# Patient Record
Sex: Female | Born: 1950 | Race: Black or African American | Hispanic: No | Marital: Single | Smoking: Never smoker
Health system: Southern US, Community
[De-identification: ages and names within clinical notes are randomized; demographics above are authoritative.]

## PROBLEM LIST (undated history)

## (undated) DIAGNOSIS — E785 Hyperlipidemia, unspecified: Secondary | ICD-10-CM

## (undated) DIAGNOSIS — R2681 Unsteadiness on feet: Secondary | ICD-10-CM

## (undated) DIAGNOSIS — F0280 Dementia in other diseases classified elsewhere without behavioral disturbance: Secondary | ICD-10-CM

## (undated) DIAGNOSIS — G8929 Other chronic pain: Secondary | ICD-10-CM

## (undated) DIAGNOSIS — S52609A Unspecified fracture of lower end of unspecified ulna, initial encounter for closed fracture: Secondary | ICD-10-CM

## (undated) DIAGNOSIS — G309 Alzheimer's disease, unspecified: Secondary | ICD-10-CM

## (undated) DIAGNOSIS — S5290XA Unspecified fracture of unspecified forearm, initial encounter for closed fracture: Secondary | ICD-10-CM

## (undated) DIAGNOSIS — E119 Type 2 diabetes mellitus without complications: Secondary | ICD-10-CM

## (undated) DIAGNOSIS — F419 Anxiety disorder, unspecified: Secondary | ICD-10-CM

## (undated) DIAGNOSIS — M1711 Unilateral primary osteoarthritis, right knee: Secondary | ICD-10-CM

## (undated) DIAGNOSIS — F0390 Unspecified dementia without behavioral disturbance: Secondary | ICD-10-CM

## (undated) DIAGNOSIS — F339 Major depressive disorder, recurrent, unspecified: Secondary | ICD-10-CM

## (undated) DIAGNOSIS — K5909 Other constipation: Secondary | ICD-10-CM

## (undated) DIAGNOSIS — R634 Abnormal weight loss: Secondary | ICD-10-CM

## (undated) DIAGNOSIS — R451 Restlessness and agitation: Secondary | ICD-10-CM

## (undated) DIAGNOSIS — I10 Essential (primary) hypertension: Secondary | ICD-10-CM

## (undated) DIAGNOSIS — K649 Unspecified hemorrhoids: Secondary | ICD-10-CM

## (undated) DIAGNOSIS — R600 Localized edema: Secondary | ICD-10-CM

## (undated) DIAGNOSIS — F0391 Unspecified dementia with behavioral disturbance: Secondary | ICD-10-CM

## (undated) HISTORY — DX: Type 2 diabetes mellitus without complications: E11.9

## (undated) HISTORY — DX: Unilateral primary osteoarthritis, right knee: M17.11

## (undated) HISTORY — PX: ROTATOR CUFF REPAIR: SHX139

## (undated) HISTORY — DX: Anxiety disorder, unspecified: F41.9

## (undated) HISTORY — DX: Restlessness and agitation: R45.1

## (undated) HISTORY — DX: Major depressive disorder, recurrent, unspecified: F33.9

## (undated) HISTORY — DX: Unspecified fracture of lower end of unspecified ulna, initial encounter for closed fracture: S52.609A

## (undated) HISTORY — DX: Unspecified dementia, unspecified severity, with behavioral disturbance: F03.91

## (undated) HISTORY — PX: TOTAL ABDOMINAL HYSTERECTOMY: SHX209

## (undated) HISTORY — DX: Alzheimer's disease, unspecified: G30.9

## (undated) HISTORY — DX: Unspecified hemorrhoids: K64.9

## (undated) HISTORY — DX: Localized edema: R60.0

## (undated) HISTORY — DX: Essential (primary) hypertension: I10

## (undated) HISTORY — DX: Hyperlipidemia, unspecified: E78.5

## (undated) HISTORY — DX: Unspecified dementia, unspecified severity, without behavioral disturbance, psychotic disturbance, mood disturbance, and anxiety: F03.90

## (undated) HISTORY — DX: Other constipation: K59.09

## (undated) HISTORY — DX: Unsteadiness on feet: R26.81

## (undated) HISTORY — DX: Other chronic pain: G89.29

## (undated) HISTORY — DX: Abnormal weight loss: R63.4

## (undated) HISTORY — DX: Dementia in other diseases classified elsewhere, unspecified severity, without behavioral disturbance, psychotic disturbance, mood disturbance, and anxiety: F02.80

## (undated) HISTORY — DX: Unspecified fracture of unspecified forearm, initial encounter for closed fracture: S52.90XA

---

## 1997-11-06 ENCOUNTER — Encounter: Admission: RE | Admit: 1997-11-06 | Discharge: 1997-11-06 | Payer: Self-pay | Admitting: Family Medicine

## 1998-04-04 ENCOUNTER — Encounter: Admission: RE | Admit: 1998-04-04 | Discharge: 1998-04-04 | Payer: Self-pay | Admitting: Family Medicine

## 1998-06-28 ENCOUNTER — Encounter: Admission: RE | Admit: 1998-06-28 | Discharge: 1998-06-28 | Payer: Self-pay | Admitting: Family Medicine

## 1998-07-11 ENCOUNTER — Encounter: Admission: RE | Admit: 1998-07-11 | Discharge: 1998-07-11 | Payer: Self-pay | Admitting: Family Medicine

## 1998-09-08 ENCOUNTER — Encounter (INDEPENDENT_AMBULATORY_CARE_PROVIDER_SITE_OTHER): Payer: Self-pay | Admitting: *Deleted

## 1998-09-08 LAB — CONVERTED CEMR LAB

## 1998-10-05 ENCOUNTER — Encounter: Admission: RE | Admit: 1998-10-05 | Discharge: 1998-10-05 | Payer: Self-pay | Admitting: Family Medicine

## 1998-10-21 ENCOUNTER — Emergency Department (HOSPITAL_COMMUNITY): Admission: EM | Admit: 1998-10-21 | Discharge: 1998-10-21 | Payer: Self-pay | Admitting: Emergency Medicine

## 1998-11-06 ENCOUNTER — Encounter: Admission: RE | Admit: 1998-11-06 | Discharge: 1998-11-06 | Payer: Self-pay | Admitting: Family Medicine

## 1998-11-09 ENCOUNTER — Encounter: Admission: RE | Admit: 1998-11-09 | Discharge: 1998-11-09 | Payer: Self-pay | Admitting: Family Medicine

## 1999-01-28 ENCOUNTER — Encounter: Admission: RE | Admit: 1999-01-28 | Discharge: 1999-01-28 | Payer: Self-pay | Admitting: Family Medicine

## 1999-01-28 ENCOUNTER — Ambulatory Visit (HOSPITAL_COMMUNITY): Admission: RE | Admit: 1999-01-28 | Discharge: 1999-01-28 | Payer: Self-pay | Admitting: Family Medicine

## 1999-05-08 ENCOUNTER — Encounter: Admission: RE | Admit: 1999-05-08 | Discharge: 1999-05-08 | Payer: Self-pay | Admitting: Family Medicine

## 1999-08-09 ENCOUNTER — Encounter: Admission: RE | Admit: 1999-08-09 | Discharge: 1999-08-09 | Payer: Self-pay | Admitting: Family Medicine

## 1999-10-25 ENCOUNTER — Encounter: Admission: RE | Admit: 1999-10-25 | Discharge: 1999-10-25 | Payer: Self-pay | Admitting: Family Medicine

## 1999-11-05 ENCOUNTER — Encounter: Admission: RE | Admit: 1999-11-05 | Discharge: 1999-11-05 | Payer: Self-pay | Admitting: *Deleted

## 1999-11-05 ENCOUNTER — Encounter: Payer: Self-pay | Admitting: *Deleted

## 1999-12-01 ENCOUNTER — Encounter: Payer: Self-pay | Admitting: Emergency Medicine

## 1999-12-01 ENCOUNTER — Emergency Department (HOSPITAL_COMMUNITY): Admission: EM | Admit: 1999-12-01 | Discharge: 1999-12-01 | Payer: Self-pay | Admitting: Emergency Medicine

## 1999-12-03 ENCOUNTER — Encounter: Admission: RE | Admit: 1999-12-03 | Discharge: 1999-12-03 | Payer: Self-pay | Admitting: Family Medicine

## 1999-12-19 ENCOUNTER — Encounter: Admission: RE | Admit: 1999-12-19 | Discharge: 1999-12-19 | Payer: Self-pay | Admitting: Family Medicine

## 2000-02-19 ENCOUNTER — Encounter: Admission: RE | Admit: 2000-02-19 | Discharge: 2000-02-19 | Payer: Self-pay | Admitting: Family Medicine

## 2000-03-12 ENCOUNTER — Encounter: Admission: RE | Admit: 2000-03-12 | Discharge: 2000-03-12 | Payer: Self-pay | Admitting: Family Medicine

## 2000-04-16 ENCOUNTER — Encounter: Admission: RE | Admit: 2000-04-16 | Discharge: 2000-04-16 | Payer: Self-pay | Admitting: Family Medicine

## 2000-07-06 ENCOUNTER — Encounter: Admission: RE | Admit: 2000-07-06 | Discharge: 2000-07-06 | Payer: Self-pay | Admitting: Family Medicine

## 2000-09-22 ENCOUNTER — Encounter: Admission: RE | Admit: 2000-09-22 | Discharge: 2000-09-22 | Payer: Self-pay | Admitting: Family Medicine

## 2000-09-30 ENCOUNTER — Encounter: Admission: RE | Admit: 2000-09-30 | Discharge: 2000-09-30 | Payer: Self-pay | Admitting: Family Medicine

## 2000-10-29 ENCOUNTER — Encounter: Admission: RE | Admit: 2000-10-29 | Discharge: 2000-10-29 | Payer: Self-pay | Admitting: Family Medicine

## 2000-12-17 ENCOUNTER — Encounter: Admission: RE | Admit: 2000-12-17 | Discharge: 2000-12-17 | Payer: Self-pay | Admitting: Family Medicine

## 2000-12-31 ENCOUNTER — Encounter: Admission: RE | Admit: 2000-12-31 | Discharge: 2000-12-31 | Payer: Self-pay | Admitting: Family Medicine

## 2001-02-02 ENCOUNTER — Encounter: Admission: RE | Admit: 2001-02-02 | Discharge: 2001-02-02 | Payer: Self-pay | Admitting: Family Medicine

## 2001-02-24 ENCOUNTER — Encounter: Admission: RE | Admit: 2001-02-24 | Discharge: 2001-02-24 | Payer: Self-pay | Admitting: Family Medicine

## 2001-06-17 ENCOUNTER — Encounter: Admission: RE | Admit: 2001-06-17 | Discharge: 2001-06-17 | Payer: Self-pay | Admitting: Family Medicine

## 2001-07-19 ENCOUNTER — Encounter: Admission: RE | Admit: 2001-07-19 | Discharge: 2001-07-19 | Payer: Self-pay | Admitting: Family Medicine

## 2001-10-11 ENCOUNTER — Encounter: Admission: RE | Admit: 2001-10-11 | Discharge: 2001-10-11 | Payer: Self-pay | Admitting: Family Medicine

## 2001-10-18 ENCOUNTER — Encounter: Admission: RE | Admit: 2001-10-18 | Discharge: 2001-10-18 | Payer: Self-pay | Admitting: Family Medicine

## 2001-12-20 ENCOUNTER — Encounter: Admission: RE | Admit: 2001-12-20 | Discharge: 2001-12-20 | Payer: Self-pay | Admitting: Family Medicine

## 2002-03-23 ENCOUNTER — Encounter: Admission: RE | Admit: 2002-03-23 | Discharge: 2002-03-23 | Payer: Self-pay | Admitting: Family Medicine

## 2002-03-24 ENCOUNTER — Encounter: Admission: RE | Admit: 2002-03-24 | Discharge: 2002-03-24 | Payer: Self-pay | Admitting: *Deleted

## 2002-03-24 ENCOUNTER — Encounter: Payer: Self-pay | Admitting: Sports Medicine

## 2002-04-07 ENCOUNTER — Encounter: Admission: RE | Admit: 2002-04-07 | Discharge: 2002-04-07 | Payer: Self-pay | Admitting: Family Medicine

## 2002-04-18 ENCOUNTER — Encounter: Admission: RE | Admit: 2002-04-18 | Discharge: 2002-05-11 | Payer: Self-pay | Admitting: Sports Medicine

## 2002-08-30 ENCOUNTER — Encounter: Admission: RE | Admit: 2002-08-30 | Discharge: 2002-08-30 | Payer: Self-pay | Admitting: Family Medicine

## 2002-09-02 ENCOUNTER — Encounter: Admission: RE | Admit: 2002-09-02 | Discharge: 2002-09-02 | Payer: Self-pay | Admitting: Family Medicine

## 2002-11-08 ENCOUNTER — Encounter: Admission: RE | Admit: 2002-11-08 | Discharge: 2002-11-08 | Payer: Self-pay | Admitting: Sports Medicine

## 2002-11-15 ENCOUNTER — Encounter: Admission: RE | Admit: 2002-11-15 | Discharge: 2002-11-15 | Payer: Self-pay | Admitting: Family Medicine

## 2002-12-15 ENCOUNTER — Encounter: Admission: RE | Admit: 2002-12-15 | Discharge: 2002-12-15 | Payer: Self-pay | Admitting: Sports Medicine

## 2003-01-17 ENCOUNTER — Encounter: Admission: RE | Admit: 2003-01-17 | Discharge: 2003-01-17 | Payer: Self-pay | Admitting: Family Medicine

## 2003-03-24 ENCOUNTER — Encounter: Admission: RE | Admit: 2003-03-24 | Discharge: 2003-03-24 | Payer: Self-pay | Admitting: Family Medicine

## 2003-07-03 ENCOUNTER — Encounter: Admission: RE | Admit: 2003-07-03 | Discharge: 2003-07-03 | Payer: Self-pay | Admitting: Family Medicine

## 2003-10-24 ENCOUNTER — Ambulatory Visit (HOSPITAL_COMMUNITY): Admission: RE | Admit: 2003-10-24 | Discharge: 2003-10-24 | Payer: Self-pay | Admitting: Orthopedic Surgery

## 2003-10-24 ENCOUNTER — Ambulatory Visit (HOSPITAL_BASED_OUTPATIENT_CLINIC_OR_DEPARTMENT_OTHER): Admission: RE | Admit: 2003-10-24 | Discharge: 2003-10-24 | Payer: Self-pay | Admitting: Orthopedic Surgery

## 2003-11-01 ENCOUNTER — Encounter: Admission: RE | Admit: 2003-11-01 | Discharge: 2003-11-01 | Payer: Self-pay | Admitting: Family Medicine

## 2003-11-24 ENCOUNTER — Encounter: Admission: RE | Admit: 2003-11-24 | Discharge: 2003-11-24 | Payer: Self-pay | Admitting: Family Medicine

## 2003-12-19 ENCOUNTER — Encounter: Admission: RE | Admit: 2003-12-19 | Discharge: 2003-12-19 | Payer: Self-pay | Admitting: Family Medicine

## 2003-12-25 ENCOUNTER — Encounter: Admission: RE | Admit: 2003-12-25 | Discharge: 2003-12-25 | Payer: Self-pay | Admitting: Family Medicine

## 2004-01-15 ENCOUNTER — Encounter: Admission: RE | Admit: 2004-01-15 | Discharge: 2004-01-15 | Payer: Self-pay | Admitting: Family Medicine

## 2004-02-02 ENCOUNTER — Encounter: Admission: RE | Admit: 2004-02-02 | Discharge: 2004-02-02 | Payer: Self-pay | Admitting: Family Medicine

## 2004-02-05 ENCOUNTER — Encounter: Admission: RE | Admit: 2004-02-05 | Discharge: 2004-02-05 | Payer: Self-pay | Admitting: Sports Medicine

## 2004-02-15 ENCOUNTER — Ambulatory Visit (HOSPITAL_COMMUNITY): Admission: RE | Admit: 2004-02-15 | Discharge: 2004-02-15 | Payer: Self-pay | Admitting: Sports Medicine

## 2004-02-15 ENCOUNTER — Encounter (INDEPENDENT_AMBULATORY_CARE_PROVIDER_SITE_OTHER): Payer: Self-pay | Admitting: *Deleted

## 2004-04-30 ENCOUNTER — Ambulatory Visit: Payer: Self-pay | Admitting: Family Medicine

## 2004-05-23 ENCOUNTER — Ambulatory Visit: Payer: Self-pay | Admitting: Family Medicine

## 2004-06-20 ENCOUNTER — Ambulatory Visit: Payer: Self-pay | Admitting: Family Medicine

## 2004-06-21 ENCOUNTER — Ambulatory Visit (HOSPITAL_COMMUNITY): Admission: RE | Admit: 2004-06-21 | Discharge: 2004-06-21 | Payer: Self-pay | Admitting: Family Medicine

## 2004-07-04 ENCOUNTER — Ambulatory Visit: Payer: Self-pay | Admitting: Family Medicine

## 2004-08-14 ENCOUNTER — Ambulatory Visit: Payer: Self-pay | Admitting: Family Medicine

## 2004-09-23 ENCOUNTER — Ambulatory Visit: Payer: Self-pay | Admitting: Family Medicine

## 2004-09-26 ENCOUNTER — Ambulatory Visit: Payer: Self-pay | Admitting: Sports Medicine

## 2004-10-14 ENCOUNTER — Ambulatory Visit (HOSPITAL_COMMUNITY): Admission: RE | Admit: 2004-10-14 | Discharge: 2004-10-14 | Payer: Self-pay | Admitting: Gastroenterology

## 2004-12-13 ENCOUNTER — Ambulatory Visit: Payer: Self-pay | Admitting: Family Medicine

## 2004-12-24 ENCOUNTER — Encounter: Admission: RE | Admit: 2004-12-24 | Discharge: 2005-03-24 | Payer: Self-pay | Admitting: Sports Medicine

## 2005-01-09 ENCOUNTER — Ambulatory Visit: Payer: Self-pay | Admitting: Sports Medicine

## 2005-02-06 ENCOUNTER — Ambulatory Visit: Payer: Self-pay | Admitting: Family Medicine

## 2005-04-08 ENCOUNTER — Ambulatory Visit: Payer: Self-pay | Admitting: Family Medicine

## 2005-06-20 ENCOUNTER — Ambulatory Visit: Payer: Self-pay | Admitting: Sports Medicine

## 2005-08-04 ENCOUNTER — Ambulatory Visit: Payer: Self-pay | Admitting: Sports Medicine

## 2005-08-27 ENCOUNTER — Ambulatory Visit: Payer: Self-pay | Admitting: Family Medicine

## 2005-08-27 ENCOUNTER — Encounter: Payer: Self-pay | Admitting: Family Medicine

## 2005-08-27 LAB — CONVERTED CEMR LAB: LDL Cholesterol: 97 mg/dL

## 2005-09-16 ENCOUNTER — Ambulatory Visit: Payer: Self-pay | Admitting: Sports Medicine

## 2005-09-16 ENCOUNTER — Ambulatory Visit (HOSPITAL_COMMUNITY): Admission: RE | Admit: 2005-09-16 | Discharge: 2005-09-16 | Payer: Self-pay | Admitting: Family Medicine

## 2005-09-25 ENCOUNTER — Ambulatory Visit (HOSPITAL_COMMUNITY): Admission: RE | Admit: 2005-09-25 | Discharge: 2005-09-25 | Payer: Self-pay | Admitting: Family Medicine

## 2005-10-02 ENCOUNTER — Ambulatory Visit: Payer: Self-pay | Admitting: Family Medicine

## 2005-10-08 ENCOUNTER — Encounter: Payer: Self-pay | Admitting: *Deleted

## 2005-11-04 ENCOUNTER — Ambulatory Visit: Payer: Self-pay | Admitting: Sports Medicine

## 2005-11-07 ENCOUNTER — Ambulatory Visit (HOSPITAL_COMMUNITY): Admission: RE | Admit: 2005-11-07 | Discharge: 2005-11-07 | Payer: Self-pay | Admitting: Sports Medicine

## 2005-11-07 ENCOUNTER — Ambulatory Visit: Payer: Self-pay | Admitting: Sports Medicine

## 2006-01-02 ENCOUNTER — Ambulatory Visit: Payer: Self-pay | Admitting: Family Medicine

## 2006-01-08 ENCOUNTER — Encounter: Payer: Self-pay | Admitting: Family Medicine

## 2006-01-08 ENCOUNTER — Ambulatory Visit: Payer: Self-pay | Admitting: Family Medicine

## 2006-01-08 LAB — CONVERTED CEMR LAB
Hgb A1c MFr Bld: 6.4 %
Microalb, Ur: NEGATIVE mg/dL

## 2006-01-19 ENCOUNTER — Ambulatory Visit: Payer: Self-pay | Admitting: Sports Medicine

## 2006-03-05 ENCOUNTER — Ambulatory Visit: Payer: Self-pay | Admitting: Sports Medicine

## 2006-05-10 ENCOUNTER — Emergency Department (HOSPITAL_COMMUNITY): Admission: EM | Admit: 2006-05-10 | Discharge: 2006-05-10 | Payer: Self-pay | Admitting: Family Medicine

## 2006-05-22 ENCOUNTER — Ambulatory Visit: Payer: Self-pay | Admitting: Sports Medicine

## 2006-05-22 ENCOUNTER — Encounter: Payer: Self-pay | Admitting: Family Medicine

## 2006-05-22 LAB — CONVERTED CEMR LAB
Albumin: 4.5 g/dL
BUN: 10 mg/dL
Calcium: 10.3 mg/dL
Creatinine, Ser: 0.85 mg/dL
Glucose, Bld: 93 mg/dL
Hgb A1c MFr Bld: 6.4 %
Potassium: 3.9 meq/L
Sodium: 142 meq/L
TSH: 0.64 microintl units/mL
Total Protein: 7.4 g/dL

## 2006-07-09 ENCOUNTER — Encounter (INDEPENDENT_AMBULATORY_CARE_PROVIDER_SITE_OTHER): Payer: Self-pay | Admitting: *Deleted

## 2006-07-09 ENCOUNTER — Ambulatory Visit: Payer: Self-pay | Admitting: Family Medicine

## 2006-08-06 DIAGNOSIS — I10 Essential (primary) hypertension: Secondary | ICD-10-CM | POA: Insufficient documentation

## 2006-08-06 DIAGNOSIS — E7800 Pure hypercholesterolemia, unspecified: Secondary | ICD-10-CM | POA: Insufficient documentation

## 2006-08-06 DIAGNOSIS — E049 Nontoxic goiter, unspecified: Secondary | ICD-10-CM | POA: Insufficient documentation

## 2006-08-06 DIAGNOSIS — J309 Allergic rhinitis, unspecified: Secondary | ICD-10-CM | POA: Insufficient documentation

## 2006-08-06 DIAGNOSIS — K589 Irritable bowel syndrome without diarrhea: Secondary | ICD-10-CM | POA: Insufficient documentation

## 2006-08-06 DIAGNOSIS — M545 Low back pain, unspecified: Secondary | ICD-10-CM | POA: Insufficient documentation

## 2006-08-07 ENCOUNTER — Encounter (INDEPENDENT_AMBULATORY_CARE_PROVIDER_SITE_OTHER): Payer: Self-pay | Admitting: *Deleted

## 2006-08-19 ENCOUNTER — Ambulatory Visit: Payer: Self-pay | Admitting: Sports Medicine

## 2006-08-19 ENCOUNTER — Encounter: Payer: Self-pay | Admitting: Family Medicine

## 2006-08-19 LAB — CONVERTED CEMR LAB
BUN: 11 mg/dL (ref 6–23)
Bilirubin Urine: NEGATIVE
Blood in Urine, dipstick: NEGATIVE
CO2: 26 meq/L (ref 19–32)
Calcium: 9.4 mg/dL (ref 8.4–10.5)
Chloride: 103 meq/L (ref 96–112)
Creatinine, Ser: 0.77 mg/dL (ref 0.40–1.20)
Glucose, Bld: 96 mg/dL (ref 70–99)
Glucose, Urine, Semiquant: NEGATIVE
Hgb A1c MFr Bld: 6.4 %
Ketones, urine, test strip: NEGATIVE
Nitrite: NEGATIVE
Potassium: 3.8 meq/L (ref 3.5–5.3)
Protein, U semiquant: NEGATIVE
Sodium: 142 meq/L (ref 135–145)
Specific Gravity, Urine: 1.02
Urobilinogen, UA: 0.2
WBC Urine, dipstick: NEGATIVE
pH: 7

## 2006-08-27 ENCOUNTER — Encounter: Payer: Self-pay | Admitting: Family Medicine

## 2006-08-27 ENCOUNTER — Ambulatory Visit: Payer: Self-pay | Admitting: Family Medicine

## 2006-08-27 LAB — CONVERTED CEMR LAB
Cholesterol: 159 mg/dL (ref 0–200)
HDL: 65 mg/dL (ref >39)
LDL Cholesterol: 77 mg/dL (ref 0–99)
Total CHOL/HDL Ratio: 2.4
Triglycerides: 83 mg/dL (ref <150)
VLDL: 17 mg/dL (ref 0–40)

## 2006-09-01 ENCOUNTER — Telehealth: Payer: Self-pay | Admitting: *Deleted

## 2006-11-16 ENCOUNTER — Ambulatory Visit: Payer: Self-pay | Admitting: Sports Medicine

## 2006-11-16 DIAGNOSIS — R7303 Prediabetes: Secondary | ICD-10-CM | POA: Insufficient documentation

## 2006-11-16 LAB — CONVERTED CEMR LAB: Hgb A1c MFr Bld: 6.6 %

## 2006-11-18 ENCOUNTER — Telehealth: Payer: Self-pay | Admitting: Family Medicine

## 2006-12-10 ENCOUNTER — Encounter: Payer: Self-pay | Admitting: Family Medicine

## 2007-01-22 ENCOUNTER — Encounter: Payer: Self-pay | Admitting: *Deleted

## 2007-01-26 ENCOUNTER — Encounter (INDEPENDENT_AMBULATORY_CARE_PROVIDER_SITE_OTHER): Payer: Self-pay | Admitting: Family Medicine

## 2007-01-26 ENCOUNTER — Ambulatory Visit: Payer: Self-pay | Admitting: Family Medicine

## 2007-01-26 LAB — CONVERTED CEMR LAB
Chlamydia, DNA Probe: NEGATIVE
GC Probe Amp, Genital: NEGATIVE
KOH Prep: NEGATIVE
Whiff Test: NEGATIVE

## 2007-01-28 ENCOUNTER — Encounter (INDEPENDENT_AMBULATORY_CARE_PROVIDER_SITE_OTHER): Payer: Self-pay | Admitting: Family Medicine

## 2007-02-02 ENCOUNTER — Ambulatory Visit: Payer: Self-pay | Admitting: Family Medicine

## 2007-02-02 LAB — CONVERTED CEMR LAB: Hgb A1c MFr Bld: 6.2 %

## 2007-03-04 ENCOUNTER — Encounter: Payer: Self-pay | Admitting: Family Medicine

## 2007-04-05 ENCOUNTER — Ambulatory Visit: Payer: Self-pay | Admitting: Sports Medicine

## 2007-04-05 ENCOUNTER — Telehealth (INDEPENDENT_AMBULATORY_CARE_PROVIDER_SITE_OTHER): Payer: Self-pay | Admitting: *Deleted

## 2007-04-05 LAB — CONVERTED CEMR LAB
Bilirubin Urine: NEGATIVE
Glucose, Urine, Semiquant: NEGATIVE
Nitrite: NEGATIVE
Protein, U semiquant: NEGATIVE
Specific Gravity, Urine: >=1.03
Urobilinogen, UA: 0.2
WBC Urine, dipstick: NEGATIVE
Whiff Test: NEGATIVE
pH: 6

## 2007-04-22 ENCOUNTER — Ambulatory Visit: Payer: Self-pay | Admitting: Family Medicine

## 2007-04-23 ENCOUNTER — Encounter: Payer: Self-pay | Admitting: Family Medicine

## 2007-06-08 ENCOUNTER — Encounter: Payer: Self-pay | Admitting: Family Medicine

## 2007-06-08 ENCOUNTER — Ambulatory Visit: Payer: Self-pay | Admitting: Family Medicine

## 2007-06-08 LAB — CONVERTED CEMR LAB
BUN: 11 mg/dL (ref 6–23)
CO2: 27 meq/L (ref 19–32)
Calcium: 9.5 mg/dL (ref 8.4–10.5)
Chloride: 104 meq/L (ref 96–112)
Creatinine, Ser: 0.78 mg/dL (ref 0.40–1.20)
Direct LDL: 83 mg/dL
Glucose, Bld: 91 mg/dL (ref 70–99)
Hgb A1c MFr Bld: 6.5 %
Potassium: 3.9 meq/L (ref 3.5–5.3)
Sodium: 143 meq/L (ref 135–145)

## 2007-06-23 ENCOUNTER — Encounter (INDEPENDENT_AMBULATORY_CARE_PROVIDER_SITE_OTHER): Payer: Self-pay | Admitting: *Deleted

## 2007-06-23 ENCOUNTER — Telehealth: Payer: Self-pay | Admitting: *Deleted

## 2007-06-23 ENCOUNTER — Ambulatory Visit: Payer: Self-pay | Admitting: Family Medicine

## 2007-07-29 ENCOUNTER — Ambulatory Visit: Payer: Self-pay | Admitting: Family Medicine

## 2007-08-24 ENCOUNTER — Ambulatory Visit: Payer: Self-pay | Admitting: Family Medicine

## 2007-08-25 ENCOUNTER — Encounter: Payer: Self-pay | Admitting: Family Medicine

## 2007-08-25 LAB — CONVERTED CEMR LAB
Chlamydia, DNA Probe: NEGATIVE
GC Probe Amp, Genital: NEGATIVE

## 2007-12-09 ENCOUNTER — Ambulatory Visit: Payer: Self-pay | Admitting: Family Medicine

## 2007-12-09 DIAGNOSIS — E663 Overweight: Secondary | ICD-10-CM | POA: Insufficient documentation

## 2007-12-30 ENCOUNTER — Encounter: Payer: Self-pay | Admitting: Family Medicine

## 2008-01-11 ENCOUNTER — Ambulatory Visit (HOSPITAL_COMMUNITY): Admission: RE | Admit: 2008-01-11 | Discharge: 2008-01-11 | Payer: Self-pay | Admitting: Orthopedic Surgery

## 2008-03-03 ENCOUNTER — Encounter: Payer: Self-pay | Admitting: Family Medicine

## 2008-03-03 ENCOUNTER — Ambulatory Visit: Payer: Self-pay | Admitting: Family Medicine

## 2008-03-03 LAB — CONVERTED CEMR LAB
BUN: 12 mg/dL (ref 6–23)
CO2: 26 meq/L (ref 19–32)
Calcium: 9.7 mg/dL (ref 8.4–10.5)
Chloride: 101 meq/L (ref 96–112)
Cholesterol: 183 mg/dL (ref 0–200)
Creatinine, Ser: 0.82 mg/dL (ref 0.40–1.20)
Glucose, Bld: 97 mg/dL (ref 70–99)
HDL: 62 mg/dL (ref >39)
LDL Cholesterol: 107 mg/dL — ABNORMAL HIGH (ref 0–99)
Potassium: 3.9 meq/L (ref 3.5–5.3)
Sodium: 140 meq/L (ref 135–145)
Total CHOL/HDL Ratio: 3
Triglycerides: 70 mg/dL (ref <150)
VLDL: 14 mg/dL (ref 0–40)

## 2008-03-07 ENCOUNTER — Encounter: Payer: Self-pay | Admitting: Family Medicine

## 2008-03-26 DIAGNOSIS — E11319 Type 2 diabetes mellitus with unspecified diabetic retinopathy without macular edema: Secondary | ICD-10-CM | POA: Insufficient documentation

## 2008-03-26 DIAGNOSIS — E113299 Type 2 diabetes mellitus with mild nonproliferative diabetic retinopathy without macular edema, unspecified eye: Secondary | ICD-10-CM | POA: Insufficient documentation

## 2008-04-18 ENCOUNTER — Ambulatory Visit: Payer: Self-pay | Admitting: Family Medicine

## 2008-04-18 ENCOUNTER — Telehealth (INDEPENDENT_AMBULATORY_CARE_PROVIDER_SITE_OTHER): Payer: Self-pay | Admitting: *Deleted

## 2008-04-18 LAB — CONVERTED CEMR LAB: Rapid Strep: NEGATIVE

## 2008-05-08 ENCOUNTER — Ambulatory Visit: Payer: Self-pay | Admitting: Family Medicine

## 2008-05-08 LAB — CONVERTED CEMR LAB: Hgb A1c MFr Bld: 7.2 %

## 2008-06-14 ENCOUNTER — Telehealth: Payer: Self-pay | Admitting: *Deleted

## 2008-07-18 ENCOUNTER — Ambulatory Visit: Payer: Self-pay | Admitting: Family Medicine

## 2008-07-18 LAB — CONVERTED CEMR LAB

## 2008-07-21 DIAGNOSIS — R809 Proteinuria, unspecified: Secondary | ICD-10-CM | POA: Insufficient documentation

## 2008-08-16 ENCOUNTER — Encounter: Payer: Self-pay | Admitting: Family Medicine

## 2008-08-16 ENCOUNTER — Ambulatory Visit: Payer: Self-pay | Admitting: Family Medicine

## 2008-08-16 LAB — CONVERTED CEMR LAB
BUN: 9 mg/dL (ref 6–23)
Bilirubin Urine: NEGATIVE
Blood in Urine, dipstick: NEGATIVE
CO2: 27 meq/L (ref 19–32)
Calcium: 9.8 mg/dL (ref 8.4–10.5)
Chloride: 101 meq/L (ref 96–112)
Creatinine, Ser: 0.77 mg/dL (ref 0.40–1.20)
Glucose, Bld: 101 mg/dL — ABNORMAL HIGH (ref 70–99)
Glucose, Urine, Semiquant: NEGATIVE
Hgb A1c MFr Bld: 6.9 %
Ketones, urine, test strip: NEGATIVE
Nitrite: NEGATIVE
Potassium: 4 meq/L (ref 3.5–5.3)
Protein, U semiquant: 30
Sodium: 141 meq/L (ref 135–145)
Specific Gravity, Urine: 1.015
TSH: 0.757 microintl units/mL (ref 0.350–4.500)
Urobilinogen, UA: 0.2
WBC Urine, dipstick: NEGATIVE
pH: 8

## 2008-08-17 ENCOUNTER — Encounter: Payer: Self-pay | Admitting: Family Medicine

## 2008-09-28 ENCOUNTER — Telehealth: Payer: Self-pay | Admitting: Family Medicine

## 2008-09-29 ENCOUNTER — Telehealth: Payer: Self-pay | Admitting: *Deleted

## 2008-09-29 ENCOUNTER — Encounter: Payer: Self-pay | Admitting: Family Medicine

## 2008-09-29 ENCOUNTER — Ambulatory Visit: Payer: Self-pay | Admitting: Family Medicine

## 2008-09-29 ENCOUNTER — Encounter: Admission: RE | Admit: 2008-09-29 | Discharge: 2008-09-29 | Payer: Self-pay | Admitting: Family Medicine

## 2008-09-29 LAB — CONVERTED CEMR LAB
ALT: 10 units/L (ref 0–35)
AST: 20 units/L (ref 0–37)
Albumin: 4.2 g/dL (ref 3.5–5.2)
Alkaline Phosphatase: 78 units/L (ref 39–117)
BUN: 12 mg/dL (ref 6–23)
Bilirubin Urine: NEGATIVE
CO2: 25 meq/L (ref 19–32)
Calcium: 9.2 mg/dL (ref 8.4–10.5)
Chloride: 103 meq/L (ref 96–112)
Creatinine, Ser: 0.78 mg/dL (ref 0.40–1.20)
Glucose, Bld: 131 mg/dL — ABNORMAL HIGH (ref 70–99)
Glucose, Urine, Semiquant: NEGATIVE
Lipase: 69 units/L (ref 0–75)
Nitrite: NEGATIVE
Potassium: 3.9 meq/L (ref 3.5–5.3)
Protein, U semiquant: NEGATIVE
Sodium: 139 meq/L (ref 135–145)
Specific Gravity, Urine: >=1.03
Total Bilirubin: 0.5 mg/dL (ref 0.3–1.2)
Total Protein: 7.1 g/dL (ref 6.0–8.3)
Urobilinogen, UA: 0.2
WBC Urine, dipstick: NEGATIVE
pH: 5.5

## 2008-11-01 ENCOUNTER — Ambulatory Visit: Payer: Self-pay | Admitting: Family Medicine

## 2008-11-01 DIAGNOSIS — B351 Tinea unguium: Secondary | ICD-10-CM | POA: Insufficient documentation

## 2008-12-25 ENCOUNTER — Encounter: Payer: Self-pay | Admitting: Family Medicine

## 2009-03-15 ENCOUNTER — Ambulatory Visit: Payer: Self-pay | Admitting: Family Medicine

## 2009-03-15 LAB — CONVERTED CEMR LAB: Hgb A1c MFr Bld: 6.6 %

## 2009-07-02 ENCOUNTER — Encounter: Payer: Self-pay | Admitting: Family Medicine

## 2009-07-02 ENCOUNTER — Telehealth: Payer: Self-pay | Admitting: *Deleted

## 2009-07-02 ENCOUNTER — Ambulatory Visit: Payer: Self-pay | Admitting: Family Medicine

## 2009-07-04 ENCOUNTER — Telehealth: Payer: Self-pay | Admitting: *Deleted

## 2009-07-05 ENCOUNTER — Ambulatory Visit: Payer: Self-pay | Admitting: Family Medicine

## 2009-07-05 ENCOUNTER — Encounter: Payer: Self-pay | Admitting: Family Medicine

## 2009-07-25 ENCOUNTER — Ambulatory Visit: Payer: Self-pay | Admitting: Family Medicine

## 2009-07-25 ENCOUNTER — Encounter: Payer: Self-pay | Admitting: Family Medicine

## 2009-07-25 LAB — CONVERTED CEMR LAB
ALT: 8 units/L (ref 0–35)
AST: 18 units/L (ref 0–37)
Albumin: 4.4 g/dL (ref 3.5–5.2)
Alkaline Phosphatase: 87 units/L (ref 39–117)
BUN: 11 mg/dL (ref 6–23)
CO2: 28 meq/L (ref 19–32)
Calcium: 9.5 mg/dL (ref 8.4–10.5)
Chloride: 101 meq/L (ref 96–112)
Cholesterol: 156 mg/dL (ref 0–200)
Creatinine, Ser: 0.77 mg/dL (ref 0.40–1.20)
Glucose, Bld: 90 mg/dL (ref 70–99)
HDL: 61 mg/dL (ref >39)
LDL Cholesterol: 86 mg/dL (ref 0–99)
Potassium: 3.5 meq/L (ref 3.5–5.3)
Sodium: 141 meq/L (ref 135–145)
Total Bilirubin: 1 mg/dL (ref 0.3–1.2)
Total CHOL/HDL Ratio: 2.6
Total Protein: 7.7 g/dL (ref 6.0–8.3)
Triglycerides: 46 mg/dL (ref <150)
VLDL: 9 mg/dL (ref 0–40)

## 2009-07-26 ENCOUNTER — Ambulatory Visit: Payer: Self-pay | Admitting: Family Medicine

## 2009-07-26 DIAGNOSIS — J019 Acute sinusitis, unspecified: Secondary | ICD-10-CM | POA: Insufficient documentation

## 2009-08-30 ENCOUNTER — Ambulatory Visit: Payer: Self-pay | Admitting: Family Medicine

## 2009-10-12 ENCOUNTER — Emergency Department (HOSPITAL_COMMUNITY): Admission: EM | Admit: 2009-10-12 | Discharge: 2009-10-12 | Payer: Self-pay | Admitting: Emergency Medicine

## 2009-10-13 ENCOUNTER — Encounter (INDEPENDENT_AMBULATORY_CARE_PROVIDER_SITE_OTHER): Payer: Self-pay | Admitting: Emergency Medicine

## 2009-10-13 ENCOUNTER — Ambulatory Visit: Payer: Self-pay | Admitting: Vascular Surgery

## 2009-10-13 ENCOUNTER — Ambulatory Visit (HOSPITAL_COMMUNITY): Admission: RE | Admit: 2009-10-13 | Discharge: 2009-10-13 | Payer: Self-pay | Admitting: Emergency Medicine

## 2009-10-15 ENCOUNTER — Ambulatory Visit: Payer: Self-pay | Admitting: Family Medicine

## 2009-10-15 DIAGNOSIS — R609 Edema, unspecified: Secondary | ICD-10-CM | POA: Insufficient documentation

## 2009-10-23 ENCOUNTER — Ambulatory Visit: Payer: Self-pay | Admitting: Family Medicine

## 2009-10-26 ENCOUNTER — Ambulatory Visit: Payer: Self-pay | Admitting: Family Medicine

## 2009-11-06 ENCOUNTER — Telehealth: Payer: Self-pay | Admitting: Family Medicine

## 2009-11-11 ENCOUNTER — Emergency Department (HOSPITAL_COMMUNITY): Admission: EM | Admit: 2009-11-11 | Discharge: 2009-11-11 | Payer: Self-pay | Admitting: Emergency Medicine

## 2009-11-29 ENCOUNTER — Encounter: Payer: Self-pay | Admitting: Family Medicine

## 2010-04-09 ENCOUNTER — Ambulatory Visit: Payer: Self-pay | Admitting: Family Medicine

## 2010-04-09 DIAGNOSIS — R1084 Generalized abdominal pain: Secondary | ICD-10-CM | POA: Insufficient documentation

## 2010-04-09 LAB — CONVERTED CEMR LAB: Hgb A1c MFr Bld: 6.5 %

## 2010-04-10 ENCOUNTER — Encounter (INDEPENDENT_AMBULATORY_CARE_PROVIDER_SITE_OTHER): Payer: Self-pay | Admitting: Pharmacist

## 2010-04-20 ENCOUNTER — Encounter: Payer: Self-pay | Admitting: Family Medicine

## 2010-04-26 ENCOUNTER — Encounter: Admission: RE | Admit: 2010-04-26 | Discharge: 2010-04-26 | Payer: Self-pay | Admitting: Family Medicine

## 2010-04-26 ENCOUNTER — Ambulatory Visit: Payer: Self-pay | Admitting: Family Medicine

## 2010-04-26 DIAGNOSIS — R059 Cough, unspecified: Secondary | ICD-10-CM | POA: Insufficient documentation

## 2010-04-26 DIAGNOSIS — R05 Cough: Secondary | ICD-10-CM

## 2010-07-09 NOTE — Assessment & Plan Note (Signed)
Summary: knee pain,tcb   Vital Signs:  Patient profile:   60 year old female Weight:      164.3 pounds Temp:     99.2 degrees F oral Pulse rate:   93 / minute BP sitting:   119 / 75  (left arm) Cuff size:   regular  Vitals Entered By: Loralee Pacas CMA (Oct 15, 2009 2:10 PM)  Primary Care Provider:  Eustaquio Boyden  MD   History of Present Illness: CC: f/u knee injury  1. leg swelling - 1 1/2 wks ago, working in yard came off ladder, then felt pinch right posterior knee, continued routine activity.  over next several days, knee kept swelling.  Went to [facility], 10/12/2009, thought had clot.  Records reviewed.  Given shot of lovenox and set up with dopplers for next day.  Ddimer 0.48, LE doppler negative for DVT.  Leg pain/swelling peaked about 3 days after pinching feeling.  Since then has been using ibuprofen which helps pain, and elevating leg.  Told may have baker's cyst on dopplers.    2. HTN - bp very well controlled on current regimen.  Allergies (verified): 1)  ! * Latex PMH-FH-SH reviewed for relevance  Physical Exam  General:  vitals reviewed. overweight, NAD. vitals reviewed. Msk:  right leg swollen from midcalf to lower thigh.  tender to palpation popliteal area, mild swelling noted there as well.  tender to palpation along knee as well.  no ligamentous laxity appreciated.  negative mcmurray's.   L leg WNL L calf measures 14 cm R calf measures 14.5 cm Pulses:  2+ periph pulses Extremities:  No clubbing, cyanosis, edema, or deformity noted with normal full range of motion of all joints.     Impression & Recommendations:  Problem # 1:  LEG EDEMA, RIGHT (ICD-782.3)  DVT ruled out with dopplers.  likely baker's cyst irritation vs rupture.  discussed hwo should see improvement over next several weeks.  Discussed elevation of the legs, icing knee, and medication use.   Red flags to return discussed.  Her updated medication list for this problem includes:  Hydrochlorothiazide 12.5 Mg Tabs (Hydrochlorothiazide) .Marland Kitchen... Take 1 tab  by mouth every morning  Orders: [facility]- Est Level  3 ([address])  Problem # 2:  HYPERTENSION, BENIGN SYSTEMIC (ICD-401.1) Assessment: Improved  well controlled. Her updated medication list for this problem includes:    Hydrochlorothiazide 12.5 Mg Tabs (Hydrochlorothiazide) .Marland Kitchen... Take 1 tab  by mouth every morning    Lisinopril 10 Mg Tabs (Lisinopril) .Marland Kitchen... Take one by mouth once daily  BP today: 119/75 Prior BP: 120/60 (08/30/2009)  Labs Reviewed: K+: 3.5 (07/25/2009) Creat: : 0.77 (07/25/2009)   Chol: 156 (07/25/2009)   HDL: 61 (07/25/2009)   LDL: 86 (07/25/2009)   TG: 46 (07/25/2009)  Orders: [facility]- Est Level  3 ([address])  Complete Medication List: 1)  Simvastatin 40 Mg Tabs (Simvastatin) .Marland Kitchen.. 1 tablet by mouth daily 2)  Glucophage Xr 750 Mg Tb24 (Metformin hcl) .... Take one by mouth once daily 3)  Hydrochlorothiazide 12.5 Mg Tabs (Hydrochlorothiazide) .... Take 1 tab  by mouth every morning 4)  Lisinopril 10 Mg Tabs (Lisinopril) .... Take one by mouth once daily 5)  Funginail  .... Daily to nail 6)  Tessalon Perles 100 Mg Caps (Benzonatate) .... One tab by mouth q 6hrs as needed cough 7)  Augmentin 500-125 Mg Tabs (Amoxicillin-pot clavulanate) .... Take on by mouth three times a day x 10 days 8)  Flonase 50 Mcg/act Susp (Fluticasone  propionate) .... Two puffs in each nostril daily 9)  Acyclovir 800 Mg Tabs (Acyclovir) .Marland Kitchen.. 1 tab by mouth 5 times a day for 7 days 10)  Hydroxyzine Hcl 25 Mg Tabs (Hydroxyzine hcl) .Marland Kitchen.. 1 tab by mouth q 8 hours as needed itching 11)  Hydrocortisone 2.5 % Crea (Hydrocortisone) .... Apply to itching area as needed disp: 80g 12)  Ibuprofen 800 Mg Tabs (Ibuprofen) .... One by mouth three times a day with food.  Patient Instructions: 1)  return in 1 1/2 weeks for follow up and to check your diabetes. 2)  This may have been a baker's cyst that ruptured. 3)  Ibuprofen 800mg  three times a  day with food. 4)  Ice behind the knee three times a day.  5)  Elevate leg as much as you can. Prescriptions: IBUPROFEN 800 MG TABS (IBUPROFEN) one by mouth three times a day with food.  #60 x 0   Entered and Authorized by:   Eustaquio Boyden  MD   Signed by:   Eustaquio Boyden  MD on 10/15/2009   Method used:   Electronically to        [facility] [phone]* (retail)       [address]       [place], [place]  [address]       Ph: [phone]       Fax: [phone]   RxID:   [id number]
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
## 2010-07-09 NOTE — Progress Notes (Signed)
Summary: test res  Phone Note Call from Patient Call back at Home Phone [phone] Call back at [phone]   Caller: Patient Summary of Call: called back to check on test results from today. Saw Dr. Dayton Martes. Initial call taken by: Clydell Hakim,  September 29, 2008 3:27 PM  Follow-up for Phone Call        spoke with patient and advised no acute abdominal or pelvic findings. this is the report received by phone from [facility].Dr. Dayton Martes notified and advised  to let patient know. Follow-up by: Theresia Lo RN,  September 29, 2008 3:50 PM

## 2010-07-09 NOTE — Miscellaneous (Signed)
Summary: refill HCTZ  Clinical Lists Changes     received refill request from pharmacy for hctz. contacted pharmacy and they did not receive the RX electonically sent 04/22/07. Rx given for HCTZ  25 MG. 1 tablet daily # 90 tablets X 1 refill.    Theresia Lo RN 3:35 PM 06/08/07  ...................................................................Eustaquio Boyden  MD  June 09, 2007 7:27 PM

## 2010-07-09 NOTE — Miscellaneous (Signed)
Summary: PATIENT SUMMARY  Clinical Lists Changes  Problems: Changed problem from KNEE PAIN, RIGHT (ICD-719.46) to MENISCUS TEAR, SMALL POSTERIOR HORN R (ICD-836.2) Removed problem of HEADACHE (ICD-784.0)    60yo with DM, HTN, HLD.  Takes a little bit of time to warm up to new providers but then very sweet and pleasant.

## 2010-07-09 NOTE — Miscellaneous (Signed)
Summary: Orders Update  Clinical Lists Changes  Problems: Added new problem of ENCOUNTER FOR LONG-TERM USE OF OTHER MEDICATIONS (ICD-V58.69) Orders: Added new Test order of B12-[facility] (82607-23330) - Signed Added new Test order of CBC-[facility] (85027) - Signed 

## 2010-07-09 NOTE — Miscellaneous (Signed)
Summary: Rfl req from Dr Tiajuana Amass Pls fill in [facility]  Medications Added SIMVASTATIN 40 MG  TABS (SIMVASTATIN) 1 tablet by mouth daily Please keep appt with MD before more refills are given.  Thanks       Clinical Lists Changes Date:01/22/07 09:35 EDT From:  To: AdM Patient:  (#[id number])  (DOB: 1950/06/16) LOV: none NOV: none.  Renew simvastatin Tablet 40 mg [Requested As: SIMVASTATIN 40 MG TABLET] take 1 tablet by mouth once daily Disp. 30 tablet  (Requested: 99) (Last Fill: 12/27/2006)   Medications: Added new medication of SIMVASTATIN 40 MG  TABS (SIMVASTATIN) 1 tablet by mouth daily Please keep appt with MD before more refills are given.  Thanks - Signed Rx of SIMVASTATIN 40 MG  TABS (SIMVASTATIN) 1 tablet by mouth daily Please keep appt with MD before more refills are given.  Thanks;  #30 x 0;  Signed;  Entered by: Jone Baseman CMA;  Authorized by: Eustaquio Boyden  MD;  Method used: Electronic    Prescriptions: SIMVASTATIN 40 MG  TABS (SIMVASTATIN) 1 tablet by mouth daily Please keep appt with MD before more refills are given.  Thanks  #30 x 0   Entered by:   Jone Baseman CMA   Authorized by:   Eustaquio Boyden  MD   Signed by:   Jone Baseman CMA on 01/22/2007   Method used:   Electronically sent to ...       [facility] [address]       [address]       [place], [place]  [address]       Ph: [phone]       Fax: [phone]   RxID:   [id number]

## 2010-07-09 NOTE — Progress Notes (Signed)
Summary: triage  Phone Note Call from Patient Call back at [phone]   Caller: Patient Summary of Call: feels really bad and wants to come in today.  n/v/feelsbad. Initial call taken by: De Nurse,  July 02, 2009 9:18 AM  Follow-up for Phone Call        has been sick x 1wk.  has n & v. last vomit was last night. no fever. wants to be seen now. she is on her way. will be seen in work in clinnic Follow-up by: Golden Circle RN,  July 02, 2009 9:38 AM

## 2010-07-09 NOTE — Assessment & Plan Note (Signed)
Summary: [facility]  Eye examination report: 03/03/07 [facility] [address] [place], [place] [address] phone: [phone] fax: [phone]  Dr. Maris Berger  Dilated fundus exam - 1. background diabetic retinopathy detected, but  only requires monitoring.  No treatment is indicated.    Comments:  one cotton-wool-spot Right eye  Pt to return for re-evaluation in 12 months.  Preventive Care Screening     Eye exam - WNL 03/03/07
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
## 2010-07-09 NOTE — Assessment & Plan Note (Signed)
Summary: FU knee pain/KH   Vital Signs:  Patient profile:   60 year old female Height:      65 inches Weight:      161 pounds BMI:     26.89 Temp:     98.1 degrees F oral BP sitting:   126 / 66  (left arm) Cuff size:   regular  Vitals Entered By: Tessie Fass CMA (Oct 23, 2009 11:27 AM) CC: F/U Is Patient Diabetic? Yes Pain Assessment Patient in pain? yes     Location: right knee Intensity: 10   Primary Care Provider:  Eustaquio Boyden  MD  CC:  F/U.  History of Present Illness: CC: f/u right knee pain and DM  1. DM - compliant with meds.  due for A1c.  no lows.  checks occasionally and run ok.  2. R knee pain - see previous office visit for HPI.  Presumed ruptured baker's cyst.  Since last week, has been using ibuprofen 800 three times a day, elevating leg, still with pain and swelling at knee joint.  No locking or giving out but does feel instability occasionally when walking and pain with walking.  No xray yet.  3. head congestion - 3d h/o rhinorrhea, congestion, HA, hoarseness.  No fevers/chills, nausea/vomiting, diarrhea, abd pain or chest pain/cough.  recently remodeling bathroom so lots of dust at home.  taking zyrtec at night.    4. HTN - at goal on meds.  compliant.  no HA, vision changes, chest pain, tightness, urinary changes, LE swelling.    Habits & Providers  Alcohol-Tobacco-Diet     Tobacco Status: never  Current Medications (verified): 1)  Glucophage Xr 750 Mg Tb24 (Metformin Hcl) .... Take One By Mouth Once Daily 2)  Hydrochlorothiazide 12.5 Mg  Tabs (Hydrochlorothiazide) .... Take 1 Tab  By Mouth Every Morning 3)  Lisinopril 10 Mg Tabs (Lisinopril) .... Take One By Mouth Once Daily 4)  Simvastatin 40 Mg  Tabs (Simvastatin) .Marland Kitchen.. 1 Tablet By Mouth Daily 5)  Zyrtec Allergy 10 Mg Tabs (Cetirizine Hcl) .... Nightly 6)  Flonase 50 Mcg/act Susp (Fluticasone Propionate) .... Two Puffs in Each Nostril Daily 7)  Acyclovir 800 Mg Tabs (Acyclovir) .Marland Kitchen.. 1 Tab  By Mouth 5 Times A Day For 7 Days 8)  Hydrocortisone 2.5 % Crea (Hydrocortisone) .... Apply To Itching Area As Needed Disp: 80g 9)  Ibuprofen 800 Mg Tabs (Ibuprofen) .... One By Mouth Three Times A Day With Food. 10)  Funginail .... Daily To Nail  Allergies (verified): 1)  ! * Latex PMH-FH-SH reviewed for relevance  Physical Exam  General:  vitals reviewed. overweight, NAD. vitals reviewed. Head:  Normocephalic and atraumatic without obvious abnormalities. No apparent alopecia or balding. Eyes:  no conjunctivitis or drainage  Ears:  TMs clear bilaterally, no fluid behind ears. Nose:  no rhinorrhea, turbinates not erythematous or boggy. Mouth:  Oral mucosa and oropharynx without lesions or exudates.  Teeth in good repair. Neck:  R AC LAD Lungs:  Normal respiratory effort, chest expands symmetrically. Lungs are clear to auscultation, no crackles or wheezes. Heart:  Normal rate and regular rhythm. S1 and S2 normal without gallop, murmur, click, rub or other extra sounds. Msk:  Knee swollen, no effusion.  tender to palpation popliteal area, mild swelling noted there as well.  tender to palpation along knee joint medial and lateral as well.  no ligamentous laxity appreciated.  negative mcmurray's.   Extremities:  No clubbing, cyanosis, edema, or deformity noted with normal full  range of motion of all joints.     Impression & Recommendations:  Problem # 1:  LEG EDEMA, RIGHT (ICD-782.3)  continue to think ruptured baker's cyst vs now meniscal tear.  referral to [facility] for [place] and consider steroid injection.    Her updated medication list for this problem includes:    Hydrochlorothiazide 12.5 Mg Tabs (Hydrochlorothiazide) .Marland Kitchen... Take 1 tab  by mouth every morning  Orders: [facility]- Est  Level 4 ([address])  Problem # 2:  DIABETES MELLITUS, TYPE II, WITH RETINOPATHY (ICD-250.50) check A1c today.  seems stable.  Her updated medication list for this problem includes:    Glucophage Xr 750 Mg Tb24  (Metformin hcl) .Marland Kitchen... Take one by mouth once daily    Lisinopril 10 Mg Tabs (Lisinopril) .Marland Kitchen... Take one by mouth once daily  Orders: A1C-[facility] ([address]) [facility]- Est  Level 4 (09811)  Labs Reviewed: Creat: 0.77 (07/25/2009)   Microalbumin: trace (07/18/2008)  Last Eye Exam: diabetic retinopathy (03/07/2008) Reviewed HgBA1c results: 6.6  (03/15/2009)  6.9 (08/16/2008)  Problem # 3:  HEADACHE (ICD-784.0) likely sinus congestion vs allergies.  on zyrtec daily, advised use nasal saline vs neti pot.  if not helping, to call us and will send script for nasal steroid.  not consistent with sinusitis.  Her updated medication list for this problem includes:    Ibuprofen 800 Mg Tabs (Ibuprofen) ..... One by mouth three times a day with food.  Orders: [facility]- Est  Level 4 (91478)  Problem # 4:  HYPERTENSION, BENIGN SYSTEMIC (ICD-401.1) at goal Her updated medication list for this problem includes:    Hydrochlorothiazide 12.5 Mg Tabs (Hydrochlorothiazide) .Marland Kitchen... Take 1 tab  by mouth every morning    Lisinopril 10 Mg Tabs (Lisinopril) .Marland Kitchen... Take one by mouth once daily  Orders: [facility]- Est  Level 4 ([address])  BP today: 126/66 Prior BP: 119/75 (10/15/2009)  Labs Reviewed: K+: 3.5 (07/25/2009) Creat: : 0.77 (07/25/2009)   Chol: 156 (07/25/2009)   HDL: 61 (07/25/2009)   LDL: 86 (07/25/2009)   TG: 46 (07/25/2009)  Complete Medication List: 1)  Glucophage Xr 750 Mg Tb24 (Metformin hcl) .... Take one by mouth once daily 2)  Hydrochlorothiazide 12.5 Mg Tabs (Hydrochlorothiazide) .... Take 1 tab  by mouth every morning 3)  Lisinopril 10 Mg Tabs (Lisinopril) .... Take one by mouth once daily 4)  Simvastatin 40 Mg Tabs (Simvastatin) .Marland Kitchen.. 1 tablet by mouth daily 5)  Zyrtec Allergy 10 Mg Tabs (Cetirizine hcl) .... Nightly 6)  Flonase 50 Mcg/act Susp (Fluticasone propionate) .... Two puffs in each nostril daily 7)  Acyclovir 800 Mg Tabs (Acyclovir) .Marland Kitchen.. 1 tab by mouth 5 times a day for 7 days 8)  Hydrocortisone  2.5 % Crea (Hydrocortisone) .... Apply to itching area as needed disp: 80g 9)  Ibuprofen 800 Mg Tabs (Ibuprofen) .... One by mouth three times a day with food. 10)  Funginail  .... Daily to nail  Patient Instructions: 1)  Friday appointment with [facility] Dr. Jennette Kettle. 2)  Elevate leg, continue ibuprofen, ice knee. 3)  Nasal saline drops.  COntinue zyrtec nightly.  if saline doesn't help, call us and we will start nasal steroid (flonase or something similar). 4)  Call clinic with questions.  Good to see you today!   Prevention & Chronic Care Immunizations   Influenza vaccine: given  (03/23/2008)   Influenza vaccine due: 03/23/2009    Tetanus booster: 08/07/2004: Done.   Tetanus booster due: 08/08/2014    Pneumococcal vaccine: Not documented  Colorectal Screening  Hemoccult: Done.  (05/09/2002)   Hemoccult due: Not Indicated    Colonoscopy: Done.  (11/07/2004)   Colonoscopy due: 11/07/2009  Other Screening   Pap smear: Done.  (09/08/1998)   Pap smear due: Not Indicated    Mammogram: normal  (12/25/2008)   Mammogram due: 12/25/2009   Smoking status: never  (10/23/2009)  Diabetes Mellitus   HgbA1C: 6.6   (03/15/2009)   Hemoglobin A1C due: 02/24/2008    Eye exam: diabetic retinopathy  (03/07/2008)   Eye exam due: 03/07/2009    Foot exam: yes  (05/08/2008)   High risk foot: Not documented   Foot care education: Not documented   Foot exam due: 04/21/2008    Urine microalbumin/creatinine ratio: Not documented   Urine microalbumin/cr due: 01/09/2007    Diabetes flowsheet reviewed?: Yes   Progress toward A1C goal: At goal  Lipids   Total Cholesterol: 156  (07/25/2009)   LDL: 86  (07/25/2009)   LDL Direct: 83  (06/08/2007)   HDL: 61  (07/25/2009)   Triglycerides: 46  (07/25/2009)    SGOT (AST): 18  (07/25/2009)   SGPT (ALT): 8  (07/25/2009)   Alkaline phosphatase: 87  (07/25/2009)   Total bilirubin: 1.0  (07/25/2009)    Lipid flowsheet reviewed?: Yes   Progress  toward LDL goal: At goal  Hypertension   Last Blood Pressure: 126 / 66  (10/23/2009)   Serum creatinine: 0.77  (07/25/2009)   Serum potassium 3.5  (07/25/2009)    Hypertension flowsheet reviewed?: Yes   Progress toward BP goal: At goal  Self-Management Support :   Personal Goals (by the next clinic visit) :     Personal A1C goal: 7  (03/15/2009)     Personal blood pressure goal: 130/80  (03/15/2009)     Personal LDL goal: 100  (03/15/2009)    Diabetes self-management support: Not documented    Diabetes self-management support not done because: Good outcomes  (03/15/2009)    Hypertension self-management support: Not documented    Hypertension self-management support not done because: Good outcomes  (03/15/2009)    Lipid self-management support: Written self-care plan, Pre-printed educational material  (03/15/2009)     Appended Document: A1c results  Laboratory Results   Blood Tests   Date/Time Received: Oct 23, 2009 12:22 PM  Date/Time Reported: Oct 23, 2009 1:37 PM   HGBA1C: 6.5%   (Normal Range: Non-Diabetic - 3-6%   Control Diabetic - 6-8%)  Comments: ...........test performed by...........Marland KitchenTerese Door, CMA

## 2010-07-09 NOTE — Assessment & Plan Note (Signed)
Summary: fu/diabetes/el   Vital Signs:  Patient Profile:   60 Years Old Female Weight:      165 pounds Temp:     98.0 degrees F Pulse rate:   82 / minute BP sitting:   113 / 75  Pt. in pain?   no  Vitals Entered By: Jone Baseman CMA (November 16, 2006 9:05 AM)                PCP:  Sharin Grave MD  Chief Complaint:  F/U DM.  History of Present Illness: Gabriela Burke comes today for follow up. Reports feeling well today. No new concerns. Her issues discussed today are as follow: 1) Chronic Low back pain: She is currently seeing a chiropractor for a recent low back pain exacerbation a couple of weeks ago,  is much better now. Also taking Ibuprofen 800mg  three times a day as needed and heat pads as I heve reccomended in the past. Denies hematuria, disuria, fever, chills, N/V. Pain ot awaking her at night. Is actually better now. 2) DM: Taking metformin 750 daily and  ASA. No polyuria, polydipsia or polyphagia. Has intentionally lost 4.5lbs. in the last 3 months. Keeps doing exercise at the [facility] as tolerated by her back pain. Her HgA1C is 6.6 today. 3)HTN: taking lisinopril w/o difficulty. Denies headache, chest pain, SOB or visual changes. 4)Hyperlipidemia:Taking sinvastatin 40mg  daily. Last Lipid pannel 03/08: LDL 77, HDL 65, TChl: WNL.          Physical Exam  General:     Well-developed, mild overeight female, in no acute distress; alert,appropriate and cooperative throughout examination Mouth:     Oral mucosa and oropharynx without lesions or exudates.  Teeth in good repair. Neck:     No deformities, masses, or tenderness noted. Lungs:     Normal respiratory effort, chest expands symmetrically. Lungs are clear to auscultation, no crackles or wheezes. Heart:     Normal rate and regular rhythm. S1 and S2 normal without gallop, murmur, click, rub or other extra sounds. Abdomen:     Bowel sounds positive,abdomen soft and non-tender without masses, organomegaly  or hernias noted. Msk:     Spine: central FROM. No CVT.  Extremities:     No LEE. Normal monofilament test bilateral. Some nails with onychomycosis but no skin brakes or ulcers in feet. Neurologic:     alert & oriented X3, cranial nerves II-XII intact, strength normal in all extremities, sensation intact to light touch, sensation intact to pinprick, gait normal, and DTRs symmetrical and normal.      Impression & Recommendations:  Problem # 1:  DM, UNCOMPLICATED, TYPE II (ICD-250.00) Assessment: Comment Only Good control. Contiue current regime. Orders: A1C-[facility] (03474) [facility]- Est Level  3 (25956)   Problem # 2:  HYPERTENSION, BENIGN SYSTEMIC (ICD-401.1) Assessment: Comment Only Good control Continue current regime. Orders: [facility]- Est Level  3 (99213)   Problem # 3:  HYPERCHOLESTEROLEMIA (ICD-272.0) Assessment: Comment Only Good control. Continiue current regime.  Orders: [facility]- Est Level  3 (99213)   Problem # 4:  BACK PAIN, LOW (ICD-724.2) Assessment: Comment Only Chronic issue for Gabriela Burke. Continue supportive meassures. She had a UA showing hematuria in sept 07  that showed resolution in follow up urinalysis last 3/08. She does not smokes.  Orders: [facility]- Est Level  3 (38756)   Diabetes Management Assessment/Plan:      The following lipid goals have been established for the patient: Total cholesterol goal of 200; LDL cholesterol  goal of 100; HDL cholesterol goal of 40; Triglyceride goal of 200.     Patient Instructions: 1)  Your Diabetes and Blood Pressure are very well controlled. 2)  Keep up the good work. 3)  Your fasting cholesterol and other labs on last visit were all normal as we discussed today. 4)  Continue same medication regime and return in 3 months to meet your new doctor. 5)  Has been a pleassure to work with you!  Laboratory Results   Blood Tests   Date/Time Recieved: November 16, 2006 9:07 AM  Date/Time Reported: November 16, 2006 9:18  AM   HGBA1C: 6.6%   (Normal Range: Non-Diabetic - 3-6%   Control Diabetic - 6-8%)  Comments: ...................................................................DONNA [facility]  November 16, 2006 9:18 AM
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
## 2010-07-09 NOTE — Miscellaneous (Signed)
Summary: mammo WNL.  letter already sent  Clinical Lists Changes  Observations: Added new observation of MAMMO DUE: 12/24/2008 (12/30/2007 15:21) Added new observation of LAST MAM DAT: 12/25/2007 (12/25/2007 15:21) Added new observation of MAMMOGRAM: normal (12/25/2007 15:21)      Last Mammogram:  normal (11/23/2006 2:38:13 PM) Mammogram Result Date:  12/25/2007 Mammogram Result:  normal Mammogram Next Due:  1 yr

## 2010-07-09 NOTE — Progress Notes (Signed)
Summary: WI request  Phone Note Call from Patient Call back at [phone]   Reason for Call: Talk to Nurse Summary of Call: pt is wanting to be worked in for possible flu Initial call taken by: Haydee Salter,  September 01, 2006 9:27 AM  Follow-up for Phone Call        home care advice given. afebrile, congested head symptoms Follow-up by: Golden Circle RN,  September 01, 2006 9:42 AM

## 2010-07-09 NOTE — Progress Notes (Signed)
Summary: triage  Phone Note Call from Patient Call back at [phone]   Caller: Patient Summary of Call: Pt thinks she has shingles again and wondering if she can get the same rxs that she had before for this called in.  Pharmacy [facility] [address] Initial call taken by: Clydell Hakim,  Nov 06, 2009 9:11 AM  Follow-up for Phone Call        to pcp Follow-up by: Golden Circle RN,  Nov 06, 2009 9:32 AM  Additional Follow-up for Phone Call Additional follow up Details #1::        please inform sent in. Additional Follow-up by: Eustaquio Boyden  MD,  Nov 06, 2009 12:20 PM    Additional Follow-up for Phone Call Additional follow up Details #2::    pt notified Follow-up by: Golden Circle RN,  Nov 06, 2009 12:30 PM  Prescriptions: ACYCLOVIR 800 MG TABS (ACYCLOVIR) 1 tab by mouth 5 times a day for 7 days  #35 x 0   Entered and Authorized by:   Eustaquio Boyden  MD   Signed by:   Eustaquio Boyden  MD on 11/06/2009   Method used:   Electronically to        [facility] [address] [phone]* (retail)       [address]       [place], [place]  [address]       Ph: [phone]       Fax: [phone]   RxID:   [id number]

## 2010-07-09 NOTE — Assessment & Plan Note (Signed)
Summary: cold symptoms/eo   Vital Signs:  Patient profile:   60 year old female Height:      65 inches Weight:      159 pounds BMI:     26.55 Temp:     98.0 degrees F oral Pulse rate:   74 / minute BP sitting:   153 / 88  (left arm) Cuff size:   regular  Vitals Entered By: Garen Grams LPN (April 26, 2010 10:58 AM) CC: ongoing cough/congestion Is Patient Diabetic? Yes Did you bring your meter with you today? No   Primary Provider:  Eustaquio Boyden  MD  CC:  ongoing cough/congestion.  History of Present Illness: pt presents with complaint of a dry non productive cough for the past week, which has progressed to now white sputum production.  she denies any fever, chills, but does note posttussive emesis occasionally.  she denies any sick contacts and notes some rhinorrhea.  pt has had her flu vaccination this year and is questionable regarding her pneumococcal vaccination.  she has tried zyrtec with minimal relief.    Preventive Screening-Counseling & Management  Alcohol-Tobacco     Alcohol drinks/day: 0     Smoking Status: never     Passive Smoke Exposure: yes  Allergies: 1)  ! * Latex  Physical Exam  General:  Well-developed,well-nourished,in no acute distress; alert,appropriate and cooperative throughout examination Head:  Normocephalic and atraumatic without obvious abnormalities. mild right maxillary sinus tenderness to palpation, no frontal sinus tenderness to palpation. Ears:  External ear exam shows no significant lesions or deformities.  Otoscopic examination reveals clear canals, tympanic membranes are intact bilaterally without bulging, retraction, inflammation or discharge. Hearing is grossly normal bilaterally. Nose:  mild turbinate hypertrophy Mouth:  Oral mucosa and oropharynx without lesions or exudates.  Teeth in good repair. Neck:  No deformities, masses, or tenderness noted. Lungs:  Normal respiratory effort, chest expands symmetrically. Lungs are clear  to auscultation, no crackles or wheezes. Heart:  Normal rate and regular rhythm. S1 and S2 normal without gallop, murmur, click, rub or other extra sounds.   Impression & Recommendations:  Problem # 1:  COUGH (ICD-786.2) Assessment New  Orders: CXR- 2view (CXR) [facility]- Est Level  3 ([address]) Due to pt's h/o DM and change in sputum will give a zpack and re evaluate in 2 weeks.  questionable allergic rhinitis component so will also start flonase and pt told to continue zyrtec OTC.    Complete Medication List: 1)  Glucophage Xr 750 Mg Tb24 (Metformin hcl) .... Take one by mouth once daily 2)  Hydrochlorothiazide 12.5 Mg Tabs (Hydrochlorothiazide) .... Take 1 tab  by mouth every morning 3)  Lisinopril 10 Mg Tabs (Lisinopril) .... Take one by mouth once daily 4)  Simvastatin 40 Mg Tabs (Simvastatin) .Marland Kitchen.. 1 tablet by mouth daily 5)  Zyrtec Allergy 10 Mg Tabs (Cetirizine hcl) .... Nightly 6)  Flonase 50 Mcg/act Susp (Fluticasone propionate) .... Two puffs in each nostril daily 7)  Acyclovir 800 Mg Tabs (Acyclovir) .Marland Kitchen.. 1 tab by mouth 5 times a day for 7 days 8)  Hydrocortisone 2.5 % Crea (Hydrocortisone) .... Apply to itching area as needed disp: 80g 9)  Ibuprofen 800 Mg Tabs (Ibuprofen) .... One by mouth three times a day with food. 10)  Funginail  .... Daily to nail 11)  Aspirin 81 Mg Tbec (Aspirin) .Marland Kitchen.. 1 tab by mouth daily 12)  Flonase 50 Mcg/act Susp (Fluticasone propionate) .... 2 sprays per nostril daily for allergies 13)  Zithromax Z-pak 250 Mg Tabs (Azithromycin) .... Take as prescribed for sinus infection and lung infection  Patient Instructions: 1)  Please make an appointment to follow up in 2-3 weeks.  however, if your symptoms improve, you may reschedule that appointment for 1 month.   2)  take antibiotic for sinus infection and lung infection.   3)  return to the emergency room if with worsening difficulty breathing, chest pain, or any other concerning symptoms.    Prescriptions: ZITHROMAX Z-PAK 250 MG TABS (AZITHROMYCIN) take as prescribed for sinus infection and lung infection  #1 x 0   Entered and Authorized by:   Maryelizabeth Kaufmann MD   Signed by:   Maryelizabeth Kaufmann MD on 04/26/2010   Method used:   Electronically to        [facility] [address] [phone]* (retail)       [address]       [place], [place]  [address]       Ph: [phone]       Fax: [phone]   RxID:   [id number] FLONASE 50 MCG/ACT SUSP (FLUTICASONE PROPIONATE) 2 sprays per nostril daily for allergies  #1 bottle x 0   Entered and Authorized by:   Maryelizabeth Kaufmann MD   Signed by:   Maryelizabeth Kaufmann MD on 04/26/2010   Method used:   Electronically to        [facility] [address] [phone]* (retail)       [address]       [place], [place]  [address]       Ph: [phone]       Fax: [phone]   RxID:   [id number]    Orders Added: 1)  CXR- 2view [CXR] 2)  [facility]- Est Level  3 [[address]]

## 2010-07-09 NOTE — Letter (Signed)
Summary: Out of Work  [facility]  [address]   [place], [place] [address]   Phone: [phone]  Fax: [phone]    July 05, 2009   Employee:  RACHAL DVORSKY    To Whom It May Concern:   For Medical reasons, please excuse the above named employee from work for the following dates:  Start:   July 05, 2009  End:   July 06, 2009  Return to work on Monday July 09, 2009  If you need additional information, please feel free to contact our office.         Sincerely,    Lequita Asal  MD

## 2010-07-09 NOTE — Assessment & Plan Note (Signed)
Summary: f/u visit/bmc   Vital Signs:  Patient Profile:   60 Years Old Female Height:     64.75 inches Weight:      154.4 pounds BMI:     25.99 Temp:     98.4 degrees F oral Pulse rate:   80 / minute BP sitting:   117 / 66  (left arm)  Pt. in pain?   no  Vitals Entered By: Garen Grams LPN (July 29, 2007 2:01 PM)                  PCP:  Eustaquio Boyden  MD  Chief Complaint:  f/u on last visit.  History of Present Illness: 60 yo patient of mine who presents for f/u of previous visit where she c/o pain all over, prescribed flexaril.  Pt states this medication knocked her out, so she has not taken.  Today doesn't complain of same issue.  1. DM - states trying to eat healthy, has lost weight, 9 lbs in last few weeks.  Does not check blood sugars.  2. HTN - bp well controlled on lisinopril, HCTZ.  Today 117/66, h/o 130s SBP.  goal for her is < 130/80.  NO HA, vision changes, CP/tightness, urinary changes.  3. Hyperlipidemia - last LDL was 85.  Good control on zocor. no myalgias.  4. GYN - h/o total hysterectomy per patient in 1970s, was on premarin and provera until early 50s, then stopped.  5. preventative med - no pap (h/o hyst), up to date on mammos, colonoscopy 2006, q 5 yrs rec.  6. Bone health - no smoking, family h/o osteo fx, was on HRT after hyst.  but low daily Ca intake.  Consider heel scan in near future.  7. Pt reports recent episode of unprotected intercourse with friend, concerned for HIV.  Had scant amount of spotting afterwards.  Would like test at next visit, as well as STD screen.  No dysuria, vaginal bleeding/discharge, fevers, abd pain, dyspareunia.     Current Allergies: No known allergies   Past Medical History:    Reviewed history from 02/02/2007 and no changes required:       s/p hysterectomy 40s (60 yo for large fibroid tumor), oophorectomy at age 81      Physical Exam  General:     Well-developed,well-nourished,in no acute  distress; alert,appropriate and cooperative throughout examination Head:     Normocephalic and atraumatic without obvious abnormalities. No apparent alopecia or balding. Eyes:     No corneal or conjunctival inflammation noted. EOMI. Perrla.  Mouth:     Oral mucosa and oropharynx without lesions or exudates.  dentures Neck:     No deformities, masses, or tenderness noted. Lungs:     Normal respiratory effort, chest expands symmetrically. Lungs are clear to auscultation, no crackles or wheezes. Heart:     Normal rate and regular rhythm. S1 and S2 normal without gallop, murmur, click, rub or other extra sounds. Abdomen:     Bowel sounds positive,abdomen soft and non-tender without masses, organomegaly or hernias noted. Msk:     No deformity or scoliosis noted of thoracic or lumbar spine.   Pulses:     2+ periph pulses Extremities:     No clubbing, cyanosis, edema, or deformity noted with normal full range of motion of all joints.   Skin:     Intact without suspicious lesions or rashes    Impression & Recommendations:  Problem # 1:  DM, UNCOMPLICATED, TYPE II (ICD-250.00) Assessment: Unchanged  Advised to check more regularly, at same time.  Given booklet to keep track of sugars.  Also advised to try and eat more regularly, as she often misses meals.  Last A1c 6.5% in december.  will recheck at next visit.  Her updated medication list for this problem includes:    Bayer Aspirin 325 Mg Tabs (Aspirin) .Marland Kitchen... Take 1/2 tablet by mouth once a day    Glucophage Xr 750 Mg Tb24 (Metformin hcl) .Marland Kitchen... Take one by mouth once daily    Lisinopril 10 Mg Tabs (Lisinopril) .Marland Kitchen... Take one by mouth once daily  Orders: [facility]- Est  Level 4 (43329)  Future Orders: A1C-[facility] (51884) ... 07/27/2008   Problem # 2:  HYPERTENSION, BENIGN SYSTEMIC (ICD-401.1) Assessment: Improved continue to monitor. Her updated medication list for this problem includes:    Hydrochlorothiazide 25 Mg Tabs  (Hydrochlorothiazide) .Marland Kitchen... Take one by mouth once daily    Lisinopril 10 Mg Tabs (Lisinopril) .Marland Kitchen... Take one by mouth once daily  Orders: [facility]- Est  Level 4 (99214)   Problem # 3:  HYPERCHOLESTEROLEMIA (ICD-272.0) Assessment: Improved good control. Her updated medication list for this problem includes:    Simvastatin 40 Mg Tabs (Simvastatin) .Marland Kitchen... 1 tablet by mouth daily  Orders: [facility]- Est  Level 4 (16606)   Problem # 4:  Preventive Health Care (ICD-V70.0) consider bone scan at next visit.  UTD on mammo, colonoscopy, s/p total hysterectomy.  Pt requests HIV test at next visit.  Discussed red flags to watch out for including increased weight loss over next few weeks, fever/chill/night sweats, enlarging lymph nodes.  Complete Medication List: 1)  Simvastatin 40 Mg Tabs (Simvastatin) .Marland Kitchen.. 1 tablet by mouth daily 2)  Bayer Aspirin 325 Mg Tabs (Aspirin) .... Take 1/2 tablet by mouth once a day 3)  Glucophage Xr 750 Mg Tb24 (Metformin hcl) .... Take one by mouth once daily 4)  Hydrochlorothiazide 25 Mg Tabs (Hydrochlorothiazide) .... Take one by mouth once daily 5)  Lisinopril 10 Mg Tabs (Lisinopril) .... Take one by mouth once daily 6)  Neurontin 300 Mg Caps (Gabapentin) .... Take 1 capsule by mouth three times a day 7)  Ultram 50 Mg Tabs (Tramadol hcl) .... Take 1 tablet by mouth every six hours 8)  Ibuprofen 800 Mg Tabs (Ibuprofen) .... Take one every eight hours as needed for pain 9)  Flexeril 5 Mg Tabs (Cyclobenzaprine hcl) .... Take 1/2 tablet before bed   Patient Instructions: 1)  Please return in a month for a follow up visit. 2)  We will check a HgA1c at your next visit. 3)  Things to watch out for - continued weight loss, gland swelling, fatigue, malaise. 4)  Continue to eat well and check your blood sugars at the same time each week. 5)  See you in a month!    Prescriptions: IBUPROFEN 800 MG  TABS (IBUPROFEN) take one every eight hours as needed for pain  #30 x 0    Entered and Authorized by:   Eustaquio Boyden  MD   Signed by:   Eustaquio Boyden  MD on 07/29/2007   Method used:   Electronically sent to ...       [facility]  [phone]*       [address]       [place], [place]  [address]       Ph: [phone]       Fax: [phone]   RxID:   [id number] LISINOPRIL 10 MG TABS (LISINOPRIL) take one  by mouth once daily  #30 x 3   Entered and Authorized by:   Eustaquio Boyden  MD   Signed by:   Eustaquio Boyden  MD on 07/29/2007   Method used:   Electronically sent to ...       [facility]  [address] [phone]*       [address]       [place], [place]  [address]       Ph: [phone]       Fax: [phone]   RxID:   [id number] GLUCOPHAGE XR 750 MG TB24 (METFORMIN HCL) Take one by mouth once daily  #30 x 3   Entered and Authorized by:   Eustaquio Boyden  MD   Signed by:   Eustaquio Boyden  MD on 07/29/2007   Method used:   Electronically sent to ...       [facility]  [address] [phone]*       [address]       [place], [place]  [address]       Ph: [phone]       Fax: [phone]   RxID:   [id number] SIMVASTATIN 40 MG  TABS (SIMVASTATIN) 1 tablet by mouth daily  #30 x 3   Entered and Authorized by:   Eustaquio Boyden  MD   Signed by:   Eustaquio Boyden  MD on 07/29/2007   Method used:   Electronically sent to ...       [facility]  [address] [phone]*       [address]       [place], [place]  [address]       Ph: [phone]       Fax: [phone]   RxID:   [id number]  ]

## 2010-07-09 NOTE — Progress Notes (Signed)
Summary: Medication  Phone Note Call from Patient Call back at [phone]   Reason for Call: Talk to Nurse Summary of Call: pt sts she was in the office yesterday and was suppose to get an Rx for 800 mgs Ibuproein 3 refills, she thinks the doctor forgot, she would like these called into [facility] Initial call taken by: ERIN LEVAN,  November 18, 2006 10:58 AM  Follow-up for Phone Call        Rx Called In Follow-up by: Sharin Grave MD,  November 19, 2006 1:35 AM    Appended Document: Medication Pt informed

## 2010-07-09 NOTE — Progress Notes (Signed)
Summary: Pelvic pain/dlh  Phone Note Call from Patient Call back at [phone]   Caller: Patient Call For: Eustaquio Boyden  MD Summary of Call: Patient describing pelvic pain and wanted to speak with nurse, feels like she needs to be seen tomorrow.  We went ahead and scheduled her appt with PCP for 11/13.  Patient aware nurse will call for her to describe symptoms.   Initial call taken by: Rae Roam,  April 05, 2007 2:19 PM  Follow-up for Phone Call        Pt states she has been experiencing lower abdominal/pelvic pain for the past few days.  Pt denies any vaginal discharge.  States she just feels generally bad like she is "coming down with something".  Scheduled pt to see Dr. Melvenia Beam today at 4pm.  Follow-up by: [facility] RN,  April 05, 2007 2:26 PM

## 2010-07-09 NOTE — Assessment & Plan Note (Signed)
Summary: lower abdominal pain/ACM   Vital Signs:  Patient Profile:   60 Years Old Female Weight:      163 pounds Temp:     99 degrees F Pulse rate:   75 / minute BP sitting:   129 / 77  Vitals Entered By: Lillia Pauls CMA (April 05, 2007 4:01 PM)                 Visit Type:  Acute visit PCP:  Eustaquio Boyden  MD  Chief Complaint:  LOW AB PAIN X ONE DAY.  History of Present Illness:     This is a 60 year old female presenting with complaints of suprapubic and vaginal "cramping like a period".  She has a history of hysterectomy at age 43 and subsequent BSO at age 58.  She presents with a one day history of cramping she rates as a 5/10 in severity.  She denies vaginal bleeding, vaginal discharge, diarrhea, fever, bloody stools.   She states the pain is in the suprabubic and vaginal area.  She has no oterh complaints today.  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  She states this might have aggravated the pain.  She has been taking ibuprofen with minimal relief of the pain.     Past Medical History:    Reviewed history from 02/02/2007 and no changes required:       s/p hysterectomy 77s (60 yo for large fibroid tumor), oophorectomy at age 70   Social History:    Reviewed history from 02/02/2007 and no changes required:       No tob, EtoH, drugs. No children. Lives with and takes care of disabled, older brother (who is possibly paranoid schizophrenic?); Works as a Advertising copywriter at [employer]      Denies chills, fatigue, fever, loss of appetite, and malaise.  CV      Denies chest pain or discomfort.  Resp      Denies chest pain with inspiration, cough, and sputum productive.  GI      Complains of abdominal pain.      Denies bloody stools, change in bowel habits, constipation, dark tarry stools, diarrhea, gas, nausea, and vomiting.  GU      Denies abnormal  vaginal bleeding and dysuria.  Derm      Denies rash.   Physical Exam  General:     Well-developed,well-nourished,in no acute distress; alert,appropriate and cooperative throughout examination Head:     Normocephalic and atraumatic without obvious abnormalities. No apparent alopecia or balding. Lungs:     Normal respiratory effort, chest expands symmetrically. Lungs are clear to auscultation, no crackles or wheezes. Heart:     Normal rate and regular rhythm. S1 and S2 normal without gallop, murmur, click, rub or other extra sounds. Abdomen:     Soft, mild tendernes in suprapubic and bilateral lower quadrants without rebounf tenderness or guarding, bowel sounds noted in all four quadrants, no masses palpable.   Genitalia:     Normal female external genitalia, vaginal mucosa pink, no ruggations, no discharge noted, bimanual elicits mild discomfort, no uterus and adnexa not palbable Extremities:     No clubbing, cyanosis, edema, or deformity noted with normal full range of motion of all joints.      Impression & Recommendations:  Problem # 1:  SUPRAPUBIC PAIN (ICD-789.09)     Patient with lower abdominal  pain of unknown etiology.  Wet prep and urinalysis normal today.  Due to mild clinical presentation and short ( one day) duration will manage expectantly.  Will refrain from blood work and imaging at this time.  If pain continues or worsens will reccommend CBC, CMP, ESR, and imaging.   Counseled to follow if pain worsens, develops bloody stools, diarrhea, rash, nausea/vomiting, ot other concerns.   Her updated medication list for this problem includes:    Bayer Aspirin 325 Mg Tabs (Aspirin) .Marland Kitchen... Take 1 tablet by mouth once a day    Ultram 50 Mg Tabs (Tramadol hcl) .Marland Kitchen... Take 1 tablet by mouth every six hours    Ibuprofen 800 Mg Tabs (Ibuprofen) .Marland Kitchen... Take one every eight hours as needed for pain  Orders: [facility]- Est Level  3 (99213) [facility] (00000[facility]   Complete Medication List: 1)  Simvastatin 40 Mg Tabs (Simvastatin) .Marland Kitchen.. 1 tablet by mouth daily please keep appt with md before more refills are given.  thanks 2)  Bayer Aspirin 325 Mg Tabs (Aspirin) .... Take 1 tablet by mouth once a day 3)  Glucophage Xr 750 Mg Tb24 (Metformin hcl) 4)  Hydrochlorothiazide 25 Mg Tabs (Hydrochlorothiazide) 5)  Lisinopril 10 Mg Tabs (Lisinopril) 6)  Neurontin 300 Mg Caps (Gabapentin) .... Take 1 capsule by mouth three times a day 7)  Ultram 50 Mg Tabs (Tramadol hcl) .... Take 1 tablet by mouth every six hours 8)  Ibuprofen 800 Mg Tabs (Ibuprofen) .... Take one every eight hours as needed for pain   Patient Instructions: 1)  Follow-up with PCP at scheduled appointment on 11/113/2008 or return sooner if you develop worsening abdominal pain. 2)  Use Ibuprofen as needed for the abdominal pain.      ] Laboratory Results   Urine Tests  Date/Time Received: April 05, 2007 4:28 PM  Date/Time Reported: April 05, 2007 5:01 PM   Routine Urinalysis   Color: yellow Appearance: Clear Glucose: negative   (Normal Range: Negative) Bilirubin: negative   (Normal Range: Negative) Ketone: trace (5)   (Normal Range: Negative) Spec. Gravity: >=1.030   (Normal Range: 1.003-1.035) Blood: small   (Normal Range: Negative) pH: 6.0   (Normal Range: 5.0-8.0) Protein: negative   (Normal Range: Negative) Urobilinogen: 0.2   (Normal Range: 0-1) Nitrite: negative   (Normal Range: Negative) Leukocyte Esterace: negative   (Normal Range: Negative)  Urine Microscopic WBC/hpf: 0-3 RBC/hpf: 0-3 Bacteria: 1+ Mucous: trace Epithelial: 1-5    Comments: ...................................................................DONNA [facility]  April 05, 2007 5:01 PM  Date/Time Received: April 05, 2007 4:28 PM  Date/Time Reported: April 05, 2007 4:40 PM   Wet [place]/KOH Source: Vaginal WBC/hpf: 1-5 Bacteria/hpf: 2+  Rods Clue cells/hpf: none  Negative  whiff Yeast/hpf: none Trichomonas/hpf: none Comments: ...................................................................DONNA [facility]  April 05, 2007 4:40 PM

## 2010-07-09 NOTE — Assessment & Plan Note (Signed)
Summary: FU DM, HTN/KH   Vital Signs:  Patient profile:   60 year old female Height:      65 inches Weight:      161.1 pounds BMI:     26.91 Temp:     99.1 degrees F oral Pulse rate:   83 / minute Pulse rhythm:   regular BP sitting:   119 / 71  (right arm)  Vitals Entered By: Modesta Messing LPN (Nov 01, 2008 1:44 PM) CC: Follow up visit. Is Patient Diabetic? Yes  Pain Assessment Patient in pain? no        History of Present Illness: CC: f/u DM, concern about legs.  1. RLE swelling- states after work notices right leg more swollen than left.  No erythema, warmth, no personal or family hsitory of blood clots.  nonsmoker.  2. right big toe - nail with fungal infection, has been trying terbinafine cream to nail with no avail.  at times hurts.  takes tedious care of nails.  not interested in podiatry.  3. HTN - compliant and tolerant of HCTZ/Lisinipril.    4. HLD - simvastatin, tolerant.  5. DM - has lost 6 lbs since my last visit with patient.  from size 12 to 6.  not necessarily trynig to lose weight, but does feel she exercises at home and watches diet.  at times forgets to eat meals.  Allergies: 1)  ! * Latex  Physical Exam  General:  Well-developed,well-nourished,in no acute distress; alert,appropriate and cooperative throughout examination Head:  Normocephalic and atraumatic without obvious abnormalities. No apparent alopecia or balding. Mouth:  Oral mucosa and oropharynx without lesions or exudates.  Teeth in good repair. Lungs:  Normal respiratory effort, chest expands symmetrically. Lungs are clear to auscultation, no crackles or wheezes. Heart:  Normal rate and regular rhythm. S1 and S2 normal without gallop, murmur, click, rub or other extra sounds. Extremities:  No clubbing, cyanosis, edema, or deformity noted with normal full range of motion of all joints.  right big toe with chronic nail infection Skin:  Intact without suspicious lesions or  rashes   Impression & Recommendations:  Problem # 1:  ONYCHOMYCOSIS, TOENAILS (ICD-110.1)  on right.  recommendd Funginail to use daily x 1+yr.  reassess as time progresses.  Her updated medication list for this problem includes:    Athletes Foot 1 % Crea (Terbinafine hcl) .Marland Kitchen... Apply to aa two times a day  Discussed nail care and medication treatment options.   Orders: [facility]- Est  Level 4 (14782)  Problem # 2:  HYPERTENSION, BENIGN SYSTEMIC (ICD-401.1) Assessment: Unchanged  continue meds. well controlled. Her updated medication list for this problem includes:    Hydrochlorothiazide 25 Mg Tabs (Hydrochlorothiazide) .Marland Kitchen... Take one by mouth once daily    Lisinopril 10 Mg Tabs (Lisinopril) .Marland Kitchen... 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  good control, last A1c 6.9.  space out visits to Q50months.  Her updated medication list for this problem includes:    Glucophage Xr 750 Mg Tb24 (Metformin hcl) .Marland Kitchen... Take one by mouth once daily    Lisinopril 10 Mg Tabs (Lisinopril) .Marland Kitchen... Take one by mouth  once daily  Labs Reviewed: Creat: 0.78 (09/29/2008)   Microalbumin: trace (07/18/2008)  Last Eye Exam: diabetic retinopathy (03/07/2008) Reviewed HgBA1c results: 6.9 (08/16/2008)  7.2 (05/08/2008)  Orders: [facility]- Est  Level 4 (40981)  Problem # 4:  HYPERCHOLESTEROLEMIA (ICD-272.0) Assessment: Unchanged  continue.  FLP after 02/2009. Her updated medication list for this problem includes:    Simvastatin 40 Mg Tabs (Simvastatin) .Marland Kitchen... 1 tablet by mouth daily  Labs Reviewed: SGOT: 20 (09/29/2008)   SGPT: 10 (09/29/2008)   HDL:62 (03/03/2008), 65 (08/27/2006)  LDL:107  (03/03/2008), 77 (08/27/2006)  Chol:183 (03/03/2008), 159 (08/27/2006)  Trig:70 (03/03/2008), 83 (08/27/2006)  Orders: [facility]- Est  Level 4 (19147)  Problem # 5:  LEG EDEMA (ICD-782.3) only on right side.  not consistent with DVT, not impressive.  discussed red flags to return. Her updated medication list for this problem includes:    Hydrochlorothiazide 25 Mg Tabs (Hydrochlorothiazide) .Marland Kitchen... Take one by mouth once daily  Orders: [facility]- Est  Level 4 (82956)  Complete Medication List: 1)  Simvastatin 40 Mg Tabs (Simvastatin) .Marland Kitchen.. 1 tablet by mouth daily 2)  Glucophage Xr 750 Mg Tb24 (Metformin hcl) .... Take one by mouth once daily 3)  Hydrochlorothiazide 25 Mg Tabs (Hydrochlorothiazide) .... Take one by mouth once daily 4)  Lisinopril 10 Mg Tabs (Lisinopril) .... Take one by mouth once daily 5)  Neurontin 300 Mg Caps (Gabapentin) .... Take 1 capsule by mouth three times a day 6)  Athletes Foot 1 % Crea (Terbinafine hcl) .... Apply to aa two times a day 7)  Funginail  .... Daily to nail  Patient Instructions: 1)  Return in 3-6 months for follow up of DM, HTN. 2)  Use funginail for the right big toe infection. 3)  Your legs look normal today.  If you start having fevers, or redness or greater swelling, you may need to be seen again. 4)  Keep a log of your sugars to bring to your next appointment. 5)  Your diabetes, weight, and blood pressure all look great today! 6)  Call clinic with any questions.

## 2010-07-09 NOTE — Miscellaneous (Signed)
Summary: lab update  Clinical Lists Changes  Observations: Added new observation of DMSELFEDGOAL: 1)  Diabetes self-education goal for microalbumin monitoring was discussed with the patient.  (04/23/2007 9:19) Added new observation of PRIMARY MD: Eustaquio Boyden  MD (04/23/2007 9:19) Added new observation of TSH: 0.64 microintl units/mL (05/22/2006 9:19) Added new observation of CALCIUM: 10.3 mg/dL (11/91/4782 9:56) Added new observation of ALBUMIN: 4.5 g/dL (21/30/8657 8:46) Added new observation of PROTEIN, TOT: 7.4 g/dL (96/29/5284 1:32) Added new observation of CREATININE: 0.85 mg/dL (44/06/270 5:36) Added new observation of BUN: 10 mg/dL (64/40/3474 2:59) Added new observation of BG RANDOM: 93 mg/dL (56/38/7564 3:32) Added new observation of K SERUM: 3.9 meq/L (05/22/2006 9:19) Added new observation of NA: 142 meq/L (05/22/2006 9:19) Added new observation of HGBA1C: 6.4 % (05/22/2006 9:19) Added new observation of MICROALB TST: neg mg/dL (95/18/8416 6:06) Added new observation of HGBA1C: 6.4 % (01/08/2006 9:19) Added new observation of LDL: 97 mg/dL (30/16/0109 3:23)          Diabetes Management History:      She is not checking home blood sugars.      Diabetes Management Assessment/Plan:      The following lipid goals have been established for the patient: Total cholesterol goal of 200; LDL cholesterol goal of 100; HDL cholesterol goal of 40; Triglyceride goal of 200.  Her blood pressure goal is < 130/80.    Diabetes Self Education 1)  Diabetes self-education goal for microalbumin monitoring was discussed with the patient.

## 2010-07-09 NOTE — Assessment & Plan Note (Signed)
Summary: f/u dm,df   Vital Signs:  Patient profile:   60 year old female Height:      65 inches Weight:      156 pounds BMI:     26.05 Temp:     98.2 degrees F oral Pulse rate:   80 / minute BP sitting:   122 / 75  (left arm) Cuff size:   regular  Vitals Entered By: Jimmy Footman, CMA (April 09, 2010 1:34 PM) CC: dm follow up Is Patient Diabetic? Yes Did you bring your meter with you today? No   Diabetic Foot Exam Foot Inspection Is there a history of a foot ulcer?              No Is there a foot ulcer now?              No Can the patient see the bottom of their feet?          No Are the shoes appropriate in style and fit?          No Is there swelling or an abnormal foot shape?          No Are the toenails long?                No Are the toenails thick?                No Are the toenails ingrown?              No Is there heavy callous build-up?              No Is there pain in the calf muscle (Intermittent claudication) when walking?    NoIs there a claw toe deformity?              No Is there elevated skin temperature?            No Is there limited ankle dorsiflexion?            No Is there foot or ankle muscle weakness?            No  Diabetic Foot Care Education Patient educated on appropriate care of diabetic feet.  Pulse Check          Right Foot          Left Foot Posterior Tibial:        normal            normal Dorsalis Pedis:        normal            normal  High Risk Feet? No Set Next Diabetic Foot Exam here: 04/10/2011   10-g (5.07) Semmes-Weinstein Monofilament Test           Right Foot          Left Foot Visual Inspection     normal           normal Test Control      normal         normal Site 1         normal         normal Site 4         normal         normal Site 5         normal         normal Site 6         normal  normal   Primary Provider:  Eustaquio Boyden  MD  CC:  dm follow up.  History of Present Illness: Pt. is here  for  1. f/u Diabetes: Patient states she thinks her diabetes may not be as well controlled as previously because her diet has not been the best lately.  She states that she skips many meals and doesn't eat as healty as she used to.  Regarding her diabetes she does see an eye doctor once per year.  Taking metformin and ace-i at home.   2.  Stomach pain: This has been a chronic problem , carries dianosis of IBS.  Describes pain as diffuse and worse mainly after eating peanuts.  However, this does not occur every time she eats peanuts and has occurred with other foods.  She denies contipation or diarrhea, non bloody stool, n/v.  Was supposed to have colonoscopy this year however she missed her appointment   Preventive Screening-Counseling & Management  Alcohol-Tobacco     Alcohol drinks/day: 0     Smoking Status: never     Passive Smoke Exposure: yes  Current Medications (verified): 1)  Glucophage Xr 750 Mg Tb24 (Metformin Hcl) .... Take One By Mouth Once Daily 2)  Hydrochlorothiazide 12.5 Mg  Tabs (Hydrochlorothiazide) .... Take 1 Tab  By Mouth Every Morning 3)  Lisinopril 10 Mg Tabs (Lisinopril) .... Take One By Mouth Once Daily 4)  Simvastatin 40 Mg  Tabs (Simvastatin) .Marland Kitchen.. 1 Tablet By Mouth Daily 5)  Zyrtec Allergy 10 Mg Tabs (Cetirizine Hcl) .... Nightly 6)  Flonase 50 Mcg/act Susp (Fluticasone Propionate) .... Two Puffs in Each Nostril Daily 7)  Acyclovir 800 Mg Tabs (Acyclovir) .Marland Kitchen.. 1 Tab By Mouth 5 Times A Day For 7 Days 8)  Hydrocortisone 2.5 % Crea (Hydrocortisone) .... Apply To Itching Area As Needed Disp: 80g 9)  Ibuprofen 800 Mg Tabs (Ibuprofen) .... One By Mouth Three Times A Day With Food. 10)  Funginail .... Daily To Nail 11)  Aspirin 81 Mg Tbec (Aspirin) .Marland Kitchen.. 1 Tab By Mouth Daily  Allergies (verified): 1)  ! * Latex  Review of Systems       Pertinent positives and negatives noted in HPI, Vitals signs noted   Physical Exam  General:  alert and well-developed.    Head:  Normocephalic and atraumatic without obvious abnormalities. No apparent alopecia or balding. Eyes:  no conjunctivitis or drainage  Mouth:  Oral mucosa and oropharynx without lesions or exudates.  Teeth in good repair. Lungs:  Normal respiratory effort, chest expands symmetrically. Lungs are clear to auscultation, no crackles or wheezes. Heart:  Normal rate and regular rhythm. S1 and S2 normal without gallop, murmur, click, rub or other extra sounds. Abdomen:  soft, +BS, non tender no guarding.     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  No change in medication indicated today.  Foot exam performed today.  WIll have her f/u in 6 months.  At that time will recheck lipids, CMET, and A1C again Her updated medication list for this problem includes:    Glucophage Xr 750 Mg Tb24 (Metformin hcl) .Marland Kitchen... Take one by mouth once daily    Lisinopril 10 Mg Tabs (Lisinopril) .Marland Kitchen... Take one by mouth once daily    Aspirin 81 Mg Tbec (Aspirin) .Marland Kitchen... 1 tab by mouth daily  Orders: A1C-[facility] (16109) [facility]- Est Level  3 ([address])  Problem # 2:  ABDOMINAL PAIN, GENERALIZED (ICD-789.07)  May just be flares of her IBS.  No red flag signs.  Instructed her that she should reschedule her appointment of her colonoscopy.  Instructed her to avoid peanuts is possible if this is making worse.    Orders: [facility]- Est Level  3 (16010)  Complete Medication List: 1)  Glucophage Xr 750 Mg Tb24 (Metformin hcl) .... Take one by mouth once daily 2)  Hydrochlorothiazide 12.5 Mg Tabs (Hydrochlorothiazide) .... Take 1 tab  by mouth every morning 3)  Lisinopril 10 Mg Tabs (Lisinopril) .... Take one by mouth once daily 4)  Simvastatin 40 Mg Tabs (Simvastatin) .Marland Kitchen.. 1 tablet by mouth daily 5)  Zyrtec Allergy 10 Mg Tabs (Cetirizine hcl) .... Nightly 6)  Flonase 50 Mcg/act Susp (Fluticasone propionate) .... Two puffs in each  nostril daily 7)  Acyclovir 800 Mg Tabs (Acyclovir) .Marland Kitchen.. 1 tab by mouth 5 times a day for 7 days 8)  Hydrocortisone 2.5 % Crea (Hydrocortisone) .... Apply to itching area as needed disp: 80g 9)  Ibuprofen 800 Mg Tabs (Ibuprofen) .... One by mouth three times a day with food. 10)  Funginail  .... Daily to nail 11)  Aspirin 81 Mg Tbec (Aspirin) .Marland Kitchen.. 1 tab by mouth daily  Patient Instructions: 1)  It was nice meeting you today. 2)  We will check your A1C today and I will let you know if we need to adjust medications or work on diet. 3)  Half of your plate should consist of fruits and vegetables.  The other half is divided into 1/4 for meat and 1/4 for starch (grains, rice, etc.) 4)  Try to eat 5-6 small meals per day 5)  Also try to reschedule your colonoscopy for this year 6)  I want you to start taking a baby aspirin (81mg ) once daily.    Orders Added: 1)  A1C-[facility] [83036] 2)  [facility]- Est Level  3 [99213]    Laboratory Results   Blood Tests   Date/Time Received: April 09, 2010 2:23 PM  Date/Time Reported: April 09, 2010 2:33 PM   HGBA1C: 6.5%   (Normal Range: Non-Diabetic - 3-6%   Control Diabetic - 6-8%)  Comments: ...............test performed by......Marland KitchenBonnie A. [place], MLS (ASCP)cm     Prevention & Chronic Care Immunizations   Influenza vaccine: given  (03/23/2008)   Influenza vaccine due: 03/23/2009    Tetanus booster: 08/07/2004: Done.   Tetanus booster due: 08/08/2014    Pneumococcal vaccine: Not documented    H. zoster vaccine: Not documented  Colorectal Screening   Hemoccult: Done.  (05/09/2002)   Hemoccult due: Not Indicated    Colonoscopy: Done.  (11/07/2004)   Colonoscopy action/deferral: GI referral  (04/09/2010)   Colonoscopy due: 11/07/2009  Other Screening   Pap smear: Done.  (09/08/1998)   Pap smear due: Not Indicated    Mammogram: normal  (12/25/2008)   Mammogram due: 12/25/2009    DXA bone density scan: Not documented   Smoking  status: never  (04/09/2010)  Diabetes Mellitus   HgbA1C: 6.5  (04/09/2010)   Hemoglobin A1C due: 10/08/2010    Eye exam: diabetic retinopathy  (03/07/2008)   Eye exam due: 03/07/2009    Foot exam: yes  (05/08/2008)   Foot exam action/deferral: Do today   High risk foot: No  (04/09/2010)   Foot care education: Done  (04/09/2010)   Foot exam due: 04/10/2011    Urine microalbumin/creatinine ratio: Not documented   Urine microalbumin/cr due: 01/09/2007  Lipids   Total Cholesterol: 156  (  07/25/2009)   LDL: 86  (07/25/2009)   LDL Direct: 83  (06/08/2007)   HDL: 61  (07/25/2009)   Triglycerides: 46  (07/25/2009)   Lipid panel due: 10/08/2010    SGOT (AST): 18  (07/25/2009)   SGPT (ALT): 8  (07/25/2009)   Alkaline phosphatase: 87  (07/25/2009)   Total bilirubin: 1.0  (07/25/2009)   Liver panel due: 10/08/2010    Lipid flowsheet reviewed?: Yes   Progress toward LDL goal: At goal  Hypertension   Last Blood Pressure: 122 / 75  (04/09/2010)   Serum creatinine: 0.77  (07/25/2009)   Serum potassium 3.5  (07/25/2009)   Basic metabolic panel due: 10/08/2010    Hypertension flowsheet reviewed?: Yes   Progress toward BP goal: At goal  Self-Management Support :   Personal Goals (by the next clinic visit) :     Personal A1C goal: 7  (03/15/2009)     Personal blood pressure goal: 130/80  (03/15/2009)     Personal LDL goal: 100  (03/15/2009)    Diabetes self-management support: Not documented    Diabetes self-management support not done because: Good outcomes  (03/15/2009)    Hypertension self-management support: Not documented    Hypertension self-management support not done because: Good outcomes  (03/15/2009)    Lipid self-management support: Written self-care plan, Pre-printed educational material  (03/15/2009)    Nursing Instructions: Give Flu vaccine today Diabetic foot exam today

## 2010-07-09 NOTE — Progress Notes (Signed)
Summary: triage  Phone Note Call from Patient Call back at [phone]   Caller: Patient Summary of Call: lower abd pain / no appetite /no fever Initial call taken by: De Nurse,  September 28, 2008 2:38 PM  Follow-up for Phone Call        pain x 4 days. last bm today. used a laxative to see if perhaps she was "backed up". no pain with voiding. pain in lower abd. comes & goes. pain is  10/10. states she had this a long time ago.  not sexually active (she volunteered this) has narcotic pain meds left from her shoulder surgery a few months ago. takes them about every other day. refused to go to Urgent Care. appt for am with Dr. Dayton Martes. told her if it got worse or she became alarmed by any other symptom, go to ED. she agreed Follow-up by: Golden Circle RN,  September 28, 2008 3:15 PM  Additional Follow-up for Phone Call Additional follow up Details #1::        thank you.  she has dx of IBS in system but has never been an issue with me.  unsure what is causing her abd pain.  she needs to b eseen. Additional Follow-up by: Eustaquio Boyden  MD,  September 28, 2008 3:25 PM

## 2010-07-09 NOTE — Letter (Signed)
Summary: Out of Work  [facility]  [address]   [place], [place] [address]   Phone: [phone]  Fax: [phone]    June 23, 2007   Employee:  Gabriela Burke    To Whom It May Concern:   For Medical reasons, please excuse the above named employee from work for the following dates:  Start:   June 23, 2007  End:   In 1-3 days when feeling better  If you need additional information, please feel free to contact our office.         Sincerely,    Benn Moulder MD

## 2010-07-09 NOTE — Assessment & Plan Note (Signed)
Summary: discharge/el    Vital Signs:  Patient Profile:   60 Years Old Female Weight:      163.2 pounds Temp:     97.6 degrees F Pulse rate:   92 / minute BP sitting:   11 / 69  (right arm)  Pt. in pain?   no  Vitals Entered By: Starleen Blue RN (January 26, 2007 8:58 AM)                PCP:  Sharin Grave MD  Chief Complaint:  vag discharge.  History of Present Illness: Complains of yellow vaginal discharge.  no itching, no burning.  Has been present x 1 week.  Reports douching recently.  No fevers, no chills, no abdominal pain.  In a monogamous sexual relationship.  Has history of multiple yeast and BV infections in past.        Physical Exam  General:     Well-developed,well-nourished,in no acute distress; alert,appropriate and cooperative throughout examination Genitalia:     Normal introitus for age, no external lesions, no vaginal discharge, mucosa pink and moist, no vaginal or cervical lesions, no vaginal atrophy, no friaility or hemorrhage, normal uterus size and position, no adnexal masses or tenderness    Impression & Recommendations:  Problem # 1:  VAGINAL DISCHARGE (ICD-623.5) Wet prep negative.  Do not have high suspicion for GC/Chlamydia, but will await results.   Orders: GC/Chlamydia-[facility] (87591/87491) Wet Prep- [facility] ([address]) [facility]- Est Level  3 ([address])   Complete Medication List: 1)  Simvastatin 40 Mg Tabs (Simvastatin) .Marland Kitchen.. 1 tablet by mouth daily please keep appt with md before more refills are given.  thanks   Patient Instructions: 1)  Patient left before I could discuss plans with her.      Vital Signs:  Patient Profile:   60 Years Old Female Weight:      163.2 pounds Temp:     97.6 degrees F Pulse rate:   92 / minute BP sitting:   11 / 69  (right arm)  Pt. in pain?   no  Vitals Entered By: Starleen Blue RN (January 26, 2007 8:58 AM)                Laboratory Results  Date/Time Received: January 26, 2007 9:24  AM  Date/Time Reported: January 26, 2007 9:37 AM   Wet Mount/KOH Source: vag WBC/hpf >20 Bacteria/hpf 1+  Rods Clue cells/hpf none  Negative whiff Yeast/hpf none KOH Negative Trichomonas/hpf none Comments several intermediate and parabasal cells present ...............test performed by......Marland KitchenBonnie A. [place], [place] (ASCP)

## 2010-07-09 NOTE — Assessment & Plan Note (Signed)
Summary: aching all over wp   Vital Signs:  Patient Profile:   60 Years Old Female Height:     64.75 inches Weight:      161 pounds BMI:     27.10 Temp:     98 degrees F Pulse rate:   90 / minute BP sitting:   131 / 73  Pt. in pain?   no  Vitals Entered By: Golden Circle RN (June 23, 2007 9:39 AM)              Is Patient Diabetic? Yes      PCP:  Eustaquio Boyden  MD  Chief Complaint:  aches all over.  History of Present Illness: 4 days of "aching all over" and fatigue.  She denies any fevers, chills, wt. loss, night sweats, joint swelling.  Very mild URI sx, but otherwise no symptoms.  Pain mostly in shoulders, back, upper extremities. Difficult to localize.  She states she has not been sleeping well over the past couple of months. She denies feeling depressed.  Good appetite.  SHe took one 800 mg ibuprofen which did not help.  She has been very busy at work and at home caring for her brother.  She did have to leave work yesterday due to pains.  Current Allergies: No known allergies      Review of Systems  General      Denies loss of appetite and sweats.  CV      Denies chest pain or discomfort, shortness of breath with exertion, swelling of feet, and swelling of hands.  GI      Denies bloody stools and change in bowel habits.  GU      Denies urinary frequency.  MS      Denies joint redness, joint swelling, and cramps.  Derm      Denies rash.   Physical Exam  General:     Well-developed,well-nourished,in no acute distress; alert,appropriate and cooperative throughout examination Mouth:     pharynx pink and moist.   Neck:     No deformities, masses, or tenderness noted. Lungs:     Normal respiratory effort, chest expands symmetrically. Lungs are clear to auscultation, no crackles or wheezes. Heart:     Normal rate and regular rhythm. S1 and S2 normal without gallop, murmur, click, rub or other extra sounds. Abdomen:     Bowel sounds  positive,abdomen soft and non-tender without masses, organomegaly or hernias noted. Msk:     normal ROM, no joint tenderness, no joint swelling, no joint warmth, no joint deformities, and no crepitation.  5/5 strength throughout. She is very tender over numerous trigger points.    Impression & Recommendations:  Problem # 1:  GENERALIZED PAIN (ICD-780.96) Assessment: New Generalized pain, fatigue x 4 days.  Could be secondary to mild illness. No fevers to suggest influenza.  Tender over multiple trigger points, some insomnia- could represent fibromyalgia. Will start flexeril at low dose (cautioned about drowsiness) to see if this helps.  She is on zocor, but has been on this medicine for a long time without difficulty.  No joint swelling or red flag symptoms to suggest rheumatologic disease.   Will f/u with dr. Sharen Hones in 2-4 weeks. If still symptomatic then would pursue lab eval including TSH, ESR, cbc, cmet, ck to rule out organic etiologies, but will hold off on work up at this time given short duration of symptoms.  Patient agreeable to plan. Orders: [facility]- Est Level  3 ([address])  Complete Medication List: 1)  Simvastatin 40 Mg Tabs (Simvastatin) .Marland Kitchen.. 1 tablet by mouth daily 2)  Bayer Aspirin 325 Mg Tabs (Aspirin) .... Take 1/2 tablet by mouth once a day 3)  Glucophage Xr 750 Mg Tb24 (Metformin hcl) .... Take one by mouth once daily 4)  Hydrochlorothiazide 25 Mg Tabs (Hydrochlorothiazide) .... Take one by mouth once daily 5)  Lisinopril 10 Mg Tabs (Lisinopril) .... Take one by mouth once daily 6)  Neurontin 300 Mg Caps (Gabapentin) .... Take 1 capsule by mouth three times a day 7)  Ultram 50 Mg Tabs (Tramadol hcl) .... Take 1 tablet by mouth every six hours 8)  Ibuprofen 800 Mg Tabs (Ibuprofen) .... Take one every eight hours as needed for pain 9)  Flexeril 5 Mg Tabs (Cyclobenzaprine hcl) .... Take 1/2 tablet before bed   Patient Instructions: 1)  take 1/2 tablet of flexeril before bed.  May increase to 1 or 2 tablets if needed.  As you know, this medicine may cause drowsiness 2)  follow up with dr. Renee Ramus in 2-4 weeks    Prescriptions: FLEXERIL 5 MG TABS (CYCLOBENZAPRINE HCL) take 1/2 tablet before bed  #30 x 0   Entered and Authorized by:   Benn Moulder MD   Signed by:   Benn Moulder MD on 06/23/2007   Method used:   Electronically sent to ...       [facility]  [address] [phone]*       [address]       [place], [place]  [address]       Ph: [phone]       Fax: [phone]   RxID:   [id number]  ]

## 2010-07-09 NOTE — Progress Notes (Signed)
Summary: Requesting excuse note  Phone Note Call from Patient Call back at [phone]   Reason for Call: Talk to Nurse Summary of Call: pt is requesting an excuse for work for today and the rest of the week, sts she was given an rx for flexeril and she can't work while shes taking it. Initial call taken by: ERIN LEVAN,  June 23, 2007 1:50 PM  Follow-up for Phone Call        Patient may have work note for today.  Is ok to work when taking flexeril if not drowsy.  Advised to take low dose and only take before bed so that work is not interfered with. Follow-up by: Benn Moulder MD,  June 23, 2007 2:06 PM  Additional Follow-up for Phone Call Additional follow up Details #1::        Pt informed of the above.  She sts she gets up at 3 in the morning so she will need to be out.  Advised her of what the letter said.  Pt will comr to pick it up Additional Follow-up by: [facility] CMA,  June 23, 2007 2:16 PM

## 2010-07-09 NOTE — Progress Notes (Signed)
Summary: Triage  Phone Note Call from Patient Call back at Home Phone [phone]   Summary of Call: c/o cold symptoms, not sleeping well, would like to be seen today Initial call taken by: Haydee Salter,  April 18, 2008 8:34 AM  Follow-up for Phone Call        Pt states she has a bad cough - she is unable to sleep well.  Feels awful would like appt today.  She has not taken anything OTC because she is diabetic and did not want to interfere with her blood sugar.  Scheduled pt for [place] this afternoon. Follow-up by: AMY MARTIN RN,  April 18, 2008 9:41 AM

## 2010-07-09 NOTE — Letter (Signed)
Summary: Out of Work  [facility]  [address]   [place], [place] [address]   Phone: [phone]  Fax: [phone]    July 02, 2009   Employee:  Gabriela Burke    To Whom It May Concern:   For Medical reasons, please excuse the above named employee from work for the following dates:  Start: 07/02/09  Back to work: 07/04/09    If you need additional information, please feel free to contact our office.         Sincerely,    [name]  MD

## 2010-07-09 NOTE — Assessment & Plan Note (Signed)
Summary: insect bite,tcb   Vital Signs:  Patient profile:   60 year old female Weight:      161.4 pounds Temp:     98.1 degrees F oral Pulse rate:   70 / minute BP sitting:   120 / 60  (left arm) Cuff size:   regular  Vitals Entered By: San Morelle, SMA CC: Pt has been itching since monday evening on left side Is Patient Diabetic? Yes Pain Assessment Patient in pain? no        Primary Care Provider:  Eustaquio Boyden  MD  CC:  Pt has been itching since monday evening on left side.  History of Present Illness: Ms. Gabriela Burke comes in complaining of itching on her left side since Monday.  Now has an "insect bite" on her lower left back, in area of the itching.  No pain but the area of itching also feels "numb and tingly" when she touches it.  Area of symptoms is from left of spine wrapping around to almost to umbilicus on left in linear/dermatomal pattern.  Itching keeps her up at night.  Very intense.  Had chicken pox as a child.    Habits & Providers  Alcohol-Tobacco-Diet     Tobacco Status: never  Allergies: 1)  ! * Latex  Physical Exam  General:  vitals reviewed. overweight, NAD. vitals reviewed. Skin:  evidence of excoriation from just left of spine to left of umbilicus in dermatomal pattern.  Small vesicular lesion in lower back with 3 smaller, nonvesicular lesions surrounding it.    Impression & Recommendations:  Problem # 1:  HERPES ZOSTER (ICD-053.9) Assessment New  Symptoms and exam appear consistent with Shingles.  Treat with acyclovir.  Give hydroxyzine and hydrocortisone for itching.   Orders: [facility]- Est  Level 4 (81191)  Complete Medication List: 1)  Simvastatin 40 Mg Tabs (Simvastatin) .Marland Kitchen.. 1 tablet by mouth daily 2)  Glucophage Xr 750 Mg Tb24 (Metformin hcl) .... Take one by mouth once daily 3)  Hydrochlorothiazide 12.5 Mg Tabs (Hydrochlorothiazide) .... Take 1 tab  by mouth every morning 4)  Lisinopril 10 Mg Tabs (Lisinopril) .... Take one by mouth  once daily 5)  Funginail  .... Daily to nail 6)  Tessalon Perles 100 Mg Caps (Benzonatate) .... One tab by mouth q 6hrs as needed cough 7)  Augmentin 500-125 Mg Tabs (Amoxicillin-pot clavulanate) .... Take on by mouth three times a day x 10 days 8)  Flonase 50 Mcg/act Susp (Fluticasone propionate) .... Two puffs in each nostril daily 9)  Acyclovir 800 Mg Tabs (Acyclovir) .Marland Kitchen.. 1 tab by mouth 5 times a day for 7 days 10)  Hydroxyzine Hcl 25 Mg Tabs (Hydroxyzine hcl) .Marland Kitchen.. 1 tab by mouth q 8 hours as needed itching 11)  Hydrocortisone 2.5 % Crea (Hydrocortisone) .... Apply to itching area as needed disp: 80g  Patient Instructions: 1)  You appear to have the early stage of shingles.  Please read the handout on shingles I gave you for more information. 2)  Take the acyclovir 5 times a day for 1 week.  This helps prevent any permanent pain or itching sensation. 3)  Use the hydroxyzine for itching as well.  It will make you drowsy, so don't drive after taking it. 4)  You can use the hydrocortisone as needed.  Prescriptions: HYDROCORTISONE 2.5 % CREA (HYDROCORTISONE) apply to itching area as needed disp: 80g  #1 x 2   Entered and Authorized by:   Ardeen Garland  MD  Signed by:   Ardeen Garland  MD on 08/30/2009   Method used:   Print then Give to Patient   RxID:   [id number] HYDROXYZINE HCL 25 MG TABS (HYDROXYZINE HCL) 1 tab by mouth q 8 hours as needed itching  #30 x 1   Entered and Authorized by:   Ardeen Garland  MD   Signed by:   Ardeen Garland  MD on 08/30/2009   Method used:   Print then Give to Patient   RxID:   [id number] ACYCLOVIR 800 MG TABS (ACYCLOVIR) 1 tab by mouth 5 times a day for 7 days  #35 x 0   Entered and Authorized by:   Ardeen Garland  MD   Signed by:   Ardeen Garland  MD on 08/30/2009   Method used:   Print then Give to Patient   RxID:   [id number]

## 2010-07-09 NOTE — Assessment & Plan Note (Signed)
Summary: STD screen/bmc   Vital Signs:  Patient Profile:   60 Years Old Female Height:     64.5 inches Weight:      156 pounds BMI:     26.46 Temp:     98.5 degrees F Pulse rate:   79 / minute BP sitting:   129 / 78  Pt. in pain?   no  Vitals Entered By: Golden Circle RN (August 24, 2007 3:00 PM)                 Last Colonoscopy:  Done. (11/07/2004 12:00:00 AM) Colonoscopy Next Due:  5 yr Last PAP:  Done. (09/08/1998 12:00:00 AM) PAP Next Due:  Not Indicated   PCP:  Eustaquio Boyden  MD  Chief Complaint:  f/u STD screen.  History of Present Illness: 60 yo patient of mine who presents for STD screen  Pt reports recent episode of unprotected intercourse with friend about 2 months ago, concerned for HIV.  Had scant amount of spotting afterwards.  Would like STD screen.  No dysuria, vaginal bleeding/discharge, fevers, abd pain, dyspareunia.    Current Allergies: No known allergies       Physical Exam  General:     Well-developed,well-nourished,in no acute distress; alert,appropriate and cooperative throughout examination Genitalia:     Normal female external genitalia, vaginal mucosa pink, no ruggations, no discharge noted.  CT/GC collected.    Impression & Recommendations:  Problem # 1:  SEXUALLY TRANSMITTED DISEASE, EXPOSURE TO (ICD-V01.6) Assessment: Comment Only Given concern and recent unprotected sexual encouter, did STD screen at this visit.  No complaint of vaginal discharge or bleeding or fevers or LAD.  Advised we would send letter with results.  Orders: GC/Chlamydia-[facility] (87591/[id number]) RPR-[facility] [phone]) HIV-[facility] ([id number]) [facility]- Est Level  3 (13086)   Problem # 2:  DM, UNCOMPLICATED, TYPE II (ICD-250.00) Assessment: Unchanged check A1c today.  Her updated medication list for this problem includes:    Bayer Aspirin 325 Mg Tabs (Aspirin) .Marland Kitchen... Take 1/2 tablet by mouth once a day    Glucophage Xr 750 Mg Tb24 (Metformin hcl) .Marland Kitchen... Take  one by mouth once daily    Lisinopril 10 Mg Tabs (Lisinopril) .Marland Kitchen... Take one by mouth once daily  Orders: [facility]- Est Level  3 (57846)   Complete Medication List: 1)  Simvastatin 40 Mg Tabs (Simvastatin) .Marland Kitchen.. 1 tablet by mouth daily 2)  Bayer Aspirin 325 Mg Tabs (Aspirin) .... Take 1/2 tablet by mouth once a day 3)  Glucophage Xr 750 Mg Tb24 (Metformin hcl) .... Take one by mouth once daily 4)  Hydrochlorothiazide 25 Mg Tabs (Hydrochlorothiazide) .... Take one by mouth once daily 5)  Lisinopril 10 Mg Tabs (Lisinopril) .... Take one by mouth once daily 6)  Neurontin 300 Mg Caps (Gabapentin) .... Take 1 capsule by mouth three times a day 7)  Ultram 50 Mg Tabs (Tramadol hcl) .... Take 1 tablet by mouth every six hours 8)  Ibuprofen 800 Mg Tabs (Ibuprofen) .... Take one every eight hours as needed for pain 9)  Flexeril 5 Mg Tabs (Cyclobenzaprine hcl) .... Take 1/2 tablet before bed   Patient Instructions: 1)  Please return in 3 months for follow up of your diabetes and blood pressure.  Today your blood pressure is good. 2)  Today everything looked healthy and normal. 3)  Continue to eat well and check your blood sugars at the same time each week. 4)  We will send you a letter with your results.  Hope you  have a great day!  Continue to care for your wound like you have up til now.    ] Laboratory Results   Blood Tests   Date/Time Received: August 24, 2007 3:44 PM  Date/Time Reported: August 24, 2007 4:15 PM    Comments: ...................................................................DONNA [facility]  August 24, 2007 4:15 PM      Appended Document: A1c report    Lab Visit   Laboratory Results   Blood Tests   Date/Time Received: August 24, 2007 3:44 PM Date/Time Reported: August 24, 2007 4:15 PM  HGBA1C: 6.5%   (Normal Range: Non-Diabetic - 3-6%   Control Diabetic - 6-8%)  Comments: ............test performed by...........Marland Kitchen Terese Door, CMA .............entered  by...........Marland KitchenBonnie A. [place], [place] (ASCP)  Nov 03, 2007 12:21 PM     Orders Today:

## 2010-07-09 NOTE — Letter (Signed)
Summary: Generic Letter  [facility]  [address]   [place], [place] [address]   Phone: [phone]  Fax: [phone]    01/28/2007  RHYLIN VENTERS [address] [place], [place]  [address]  Dear Ms. Veronica, Your gonorrhea and chlamydia tests were negative.           Sincerely,   Levander Campion MD [facility]  Appended Document: Generic Letter letter mailed.

## 2010-07-09 NOTE — Progress Notes (Signed)
Summary: Triage  Phone Note Call from Patient Call back at Home Phone [phone]   Summary of Call: thinks she has a sinus infection and wants to discuss with rn. Initial call taken by: Haydee Salter,  June 14, 2008 3:11 PM  Follow-up for Phone Call        sick x 2 days. ha, nasal congestion. she is using mucinex & afrin. told her coricidin is ok with her htn. she did not want an appt at this time. to increase fluids and rest. she agreed with plan Follow-up by: Golden Circle RN,  June 14, 2008 3:14 PM
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
## 2010-07-09 NOTE — Letter (Signed)
Summary: Results Follow-up Letter  [facility]  [address]   [place], [place] [address]   Phone: [phone]  Fax: [phone]    08/25/2007  [address] [place], [place]  [address]  Dear Ms. Creekmore,   The following are the results of your recent test(s):  Test     Result     All your tests came back normal and healthy.  I hope you have a great weekend!  I'll see you in 3 months.    Sincerely,  Eustaquio Boyden  MD [facility]

## 2010-07-09 NOTE — Assessment & Plan Note (Signed)
Summary: cpp wp  Medications Added BAYER ASPIRIN 325 MG TABS (ASPIRIN) Take 1 tablet by mouth once a day GLUCOPHAGE XR 750 MG TB24 (METFORMIN HCL)  HYDROCHLOROTHIAZIDE 25 MG TABS (HYDROCHLOROTHIAZIDE)  LISINOPRIL 10 MG TABS (LISINOPRIL)  NEURONTIN 300 MG CAPS (GABAPENTIN) Take 1 capsule by mouth three times a day ULTRAM 50 MG TABS (TRAMADOL HCL) Take 1 tablet by mouth every six hours IBUPROFEN 800 MG  TABS (IBUPROFEN) take one every eight hours as needed for pain        Vital Signs:  Patient Profile:   60 Years Old Female Weight:      164 pounds Temp:     98.3 degrees F Pulse rate:   84 / minute BP sitting:   130 / 64  Pt. in pain?   yes    Location:   left hip    Intensity:   9  Vitals Entered By: Jone Baseman CMA (February 02, 2007 4:05 PM)                PCP:  Eustaquio Boyden  MD  Chief Complaint:  CPP.  History of Present Illness: 60 yo female who presents as a new patient to me today  1.) T2DM - controlled on Metformin 850 mg two times a day, HgA1C 6.8 today, has eye doctor appointment in September, tries to adhere to good diet, eats salads.  Does complain of paresthesias in fingers but not in feet, states has good foot hygiene.  No urinary changes, no vision changes other than blurry vision even with eyeglasses  2.) HTN - On ACEI and HCTZ, states not always compliant with medications, only when she "remembers" to take them, has missed a few doses recently.  Today BP 130/64.  Does complain of headaches, but mostly when she is very stressed out, does have blurry vision but has apptment with doctor in Sept.  No spots or blackouts or other vision changes.  No urinary changes, chest pain, SOB.  3.) Hypercholesterolemia - latest LDL 76 (3/08), continues to take simvastatin daily.  4.) L hip pain - states worse with activity, especially when she uses buffer machine to buffer floor.  Sharp pain that shoots down leg.  5.) Asthma - no longer uses rescue albuterol.   Instead drinks glass of cold water and tries to calm herself down, that has been working well for her.  6.) Preventive health - no paps as h/o hysterectomy, last mammo in 11/23/06 WNL, last colonoscopy around 2005, has them every 5 years b/c brother with h/o polyps  Diabetes Management History:      She states understanding of dietary principles and is following her diet appropriately.  No sensory loss is reported.  Self foot exams are being performed.        Past Medical History:    s/p hysterectomy 37s (60 yo for large fibroid tumor), oophorectomy at age 64   Family History:    Brother DM II, stroke, Father alive in his 52`s uknw med prob. as per 03-17-23, Mother deceased lived to 62- Stroke, Older Sister -CAD, DM II, hypercholest, uterine Ca. (? pt doesn't recall this), Younger Sister-glucose intolerance.  Social History:    No tob, EtoH, drugs. No children. Lives with and takes care of disabled, older brother (who is possibly paranoid schizophrenic?); Works as a Advertising copywriter at [employer]   Risk Factors:  Passive smoke exposure:  yes Drug use:  no Alcohol use:  no   Review of Systems  no fevers/chills/night sweats, n/v/d/c, urinary changes, rash, weight loss.  see HPI, o/w negative.   Physical Exam  General:     Well-developed,well-nourished,in no acute distress; alert,appropriate and cooperative throughout examination Head:     Normocephalic and atraumatic without obvious abnormalities. No apparent alopecia or balding. Eyes:     No corneal or conjunctival inflammation noted. EOMI. Perrla. Funduscopic exam benign, without hemorrhages, exudates or papilledema. Vision grossly normal. Mouth:     Oral mucosa and oropharynx without lesions or exudates.  Dentures present above and below Neck:     No deformities, masses, or tenderness noted. Lungs:     Normal respiratory effort, chest expands symmetrically. Lungs are clear to auscultation, no crackles or wheezes. Heart:      Normal rate and regular rhythm. S1 and S2 normal without gallop, murmur, click, rub or other extra sounds. Abdomen:     Bowel sounds positive,abdomen soft and non-tender without masses, organomegaly or hernias noted. Msk:     No deformity or scoliosis noted of thoracic or lumbar spine.   Pulses:     R and L carotid,radial, posterior tibial and dorsalis pedis pulses strong Extremities:     No clubbing, cyanosis, edema, or deformity noted with normal full range of motion of all joints.   Neurologic:     No cranial nerve deficits noted. Station and gait are normal. Sensory, motor and coordinative functions appear intact. Skin:     Intact without suspicious lesions or rashes Cervical Nodes:     No lymphadenopathy noted  Diabetes Management Exam:    Foot Exam (with socks and/or shoes not present):       Sensory-Pinprick/Light touch:          Left medial foot (L-4): normal          Left dorsal foot (L-5): normal          Left lateral foot (S-1): normal          Right medial foot (L-4): normal          Right dorsal foot (L-5): normal          Right lateral foot (S-1): normal       Inspection:          Left foot: normal          Right foot: normal       Nails:          Left foot: normal          Right foot: thickened    Eye Exam:       Eye Exam done here today    Impression & Recommendations:  Problem # 1:  DM, UNCOMPLICATED, TYPE II (ICD-250.00) Assessment: Improved Well controlled on metformin 850 mg two times a day.  also trying to adhere to good and healthy diet.  Sept has scheduled eye appointment, foot exam benign.  Her updated medication list for this problem includes:    Bayer Aspirin 325 Mg Tabs (Aspirin) .Marland Kitchen... Take 1 tablet by mouth once a day    Glucophage Xr 750 Mg Tb24 (Metformin hcl)    Lisinopril 10 Mg Tabs (Lisinopril)  Orders: A1C-[facility] (95284) [facility]- Est  Level 4 (13244)   Problem # 2:  HYPERTENSION, BENIGN SYSTEMIC (ICD-401.1) Assessment: Unchanged Advised  to continue to watch her BP, as today's SBP is a bit high at 130.  Will continue to monitor, encouraged to keep taking her HTN medication as prescribed.   Her updated medication list for this  problem includes:    Hydrochlorothiazide 25 Mg Tabs (Hydrochlorothiazide)    Lisinopril 10 Mg Tabs (Lisinopril)  Orders: [facility]- Est  Level 4 (99214)   Problem # 3:  HYPERCHOLESTEROLEMIA (ICD-272.0) Assessment: Improved LDL 76, well controlled. continue to monitor  Her updated medication list for this problem includes:    Simvastatin 40 Mg Tabs (Simvastatin) .Marland Kitchen... 1 tablet by mouth daily please keep appt with md before more refills are given.  thanks  Orders: [facility]- Est  Level 4 (11914)   Problem # 4:  HIP PAIN (ICD-719.45) Given prescription of ibuprofen to use as needed.  States she only uses about 1 every few days. continue to monitor at next visit.  to return in 3 months.  Her updated medication list for this problem includes:    Bayer Aspirin 325 Mg Tabs (Aspirin) .Marland Kitchen... Take 1 tablet by mouth once a day    Ultram 50 Mg Tabs (Tramadol hcl) .Marland Kitchen... Take 1 tablet by mouth every six hours    Ibuprofen 800 Mg Tabs (Ibuprofen) .Marland Kitchen... Take one every eight hours as needed for pain  Orders: [facility]- Est  Level 4 (78295)   Problem # 5:  Preventive Health Care (ICD-V70.0) no pap needed, mammo due next year, colonoscopy due in a few years.  Complete Medication List: 1)  Simvastatin 40 Mg Tabs (Simvastatin) .Marland Kitchen.. 1 tablet by mouth daily please keep appt with md before more refills are given.  thanks 2)  Bayer Aspirin 325 Mg Tabs (Aspirin) .... Take 1 tablet by mouth once a day 3)  Glucophage Xr 750 Mg Tb24 (Metformin hcl) 4)  Hydrochlorothiazide 25 Mg Tabs (Hydrochlorothiazide) 5)  Lisinopril 10 Mg Tabs (Lisinopril) 6)  Neurontin 300 Mg Caps (Gabapentin) .... Take 1 capsule by mouth three times a day 7)  Ultram 50 Mg Tabs (Tramadol hcl) .... Take 1 tablet by mouth every six hours 8)  Ibuprofen 800 Mg Tabs  (Ibuprofen) .... Take one every eight hours as needed for pain  Diabetes Management Assessment/Plan:      The following lipid goals have been established for the patient: Total cholesterol goal of 200; LDL cholesterol goal of 100; HDL cholesterol goal of 40; Triglyceride goal of 200.  Her blood pressure goal is < 130/80.     Patient Instructions: 1)  Please schedule a follow-up appointment in 3 months. 2)  Please keep taking your medications as prescribed. 3)  Continue to eat healthy as you have been doing. 4)  Keep walking as much as you can to stay healthy. 5)  Return in three months for another checkup.    Prescriptions: IBUPROFEN 800 MG  TABS (IBUPROFEN) take one every eight hours as needed for pain  #30 x 0   Entered and Authorized by:   Eustaquio Boyden  MD   Signed by:   Eustaquio Boyden  MD on 02/02/2007   Method used:   Electronically sent to ...       [facility] [address]       [address]       [place], [place]  [address]       Ph: [phone]       Fax: [phone]   RxID:   [id number]   Laboratory Results   Blood Tests   Date/Time Recieved: February 02, 2007 4:00  PM  Date/Time Reported: February 02, 2007 4:16 PM   HGBA1C: 6.2%   (Normal Range: Non-Diabetic - 3-6%   Control Diabetic - 6-8%)  Comments: ............test performed  by...........Marland Kitchen Terese Door, CMA

## 2010-07-09 NOTE — Assessment & Plan Note (Signed)
Summary: N & V,feels bad/[facility] [place]   Vital Signs:  Patient profile:   60 year old female Weight:      156 pounds Temp:     98.2 degrees F oral Pulse rate:   84 / minute BP sitting:   138 / 90  (left arm) Cuff size:   regular  Vitals Entered By: Loralee Pacas CMA (July 02, 2009 10:05 AM) CC: cough, sore throat, runny nose    Primary Care Provider:  Eustaquio Boyden  MD  CC:  cough, sore throat, and runny nose .  History of Present Illness: 1) Sore throat: since Friday. Relieved by OTC throat spray. Also with productive cough with clear/yellow phlegm, runny nose, fatigue. Has chronic hot flashes intermittently but no fevers/chills. Denies nausea, emesis, diarrhea, specific sick contacts, dysuria, chest pain, edema, headache, presyncope.   Current Medications (verified): 1)  Simvastatin 40 Mg  Tabs (Simvastatin) .Marland Kitchen.. 1 Tablet By Mouth Daily 2)  Glucophage Xr 750 Mg Tb24 (Metformin Hcl) .... Take One By Mouth Once Daily 3)  Hydrochlorothiazide 12.5 Mg  Tabs (Hydrochlorothiazide) .... Take 1 Tab  By Mouth Every Morning 4)  Lisinopril 10 Mg Tabs (Lisinopril) .... Take One By Mouth Once Daily 5)  Funginail .... Daily To Nail 6)  Tessalon Perles 100 Mg Caps (Benzonatate) .... One Tab By Mouth Q 6hrs As Needed Cough  Allergies (verified): 1)  ! * Latex  Physical Exam  General:  vitals reviewed. overweight, coughing, NAD  Eyes:  no conjunctivitis or drainage  Ears:  TMs clear bilaterally  Nose:  no rhinorrhea  Mouth:  no erythema or exudate, no tonsillar hypertrophy, post nasal drip, pharynx pink and moist.  Neck:  no lymphadenopathy   Lungs:  CTAB w/o wheeze or crackles  Heart:  RRR, no murmurs  Abdomen:  soft, +BS, non tender  Pulses:  2+ radials  Extremities:  no edema  Neurologic:  alert & oriented X3.     Impression & Recommendations:  Problem # 1:  URI (ICD-465.9) Assessment New  Likely viral. No red flags. Does not meet criteria for strep test (no fever, no  lymphadenopathy , no exudates, +cough). No red flags on history or exam. Will treat symptomatically. Advised staying well hydrated and getting rest. Follow up as needed if not improving in one week. Handwashing to prevent spread.  Her updated medication list for this problem includes:    Tessalon Perles 100 Mg Caps (Benzonatate) ..... One tab by mouth q 6hrs as needed cough  Orders: [facility]- Est Level  3 (91478)  Complete Medication List: 1)  Simvastatin 40 Mg Tabs (Simvastatin) .Marland Kitchen.. 1 tablet by mouth daily 2)  Glucophage Xr 750 Mg Tb24 (Metformin hcl) .... Take one by mouth once daily 3)  Hydrochlorothiazide 12.5 Mg Tabs (Hydrochlorothiazide) .... Take 1 tab  by mouth every morning 4)  Lisinopril 10 Mg Tabs (Lisinopril) .... Take one by mouth once daily 5)  Funginail  .... Daily to nail 6)  Tessalon Perles 100 Mg Caps (Benzonatate) .... One tab by mouth q 6hrs as needed cough  Patient Instructions: 1)  It was great to see you today! 2)  Hope you feel better.  3)  Take tessalon perles as directed to help stop coughing.  4)  Use over the counter throat spray for sore throat as needed. 5)  Follow up in one week if not improving.  Prescriptions: TESSALON PERLES 100 MG CAPS (BENZONATATE) One tab by mouth q 6hrs as needed cough  #30 x 0  Entered and Authorized by:   [name]  MD   Signed by:   [name]  MD on 07/02/2009   Method used:   Electronically to        [facility] [address] [phone]* (retail)       [address]       [place], [place]  [address]       Ph: [phone]       Fax: [phone]   RxID:   [id number]

## 2010-07-09 NOTE — Assessment & Plan Note (Signed)
Vital Signs:  Patient Profile:   60 Years Old Female Weight:      169.5 pounds Temp:     97.8 degrees F Pulse rate:   81 / minute BP sitting:   133 / 85  (left arm)  Pt. in pain?   yes    Location:   back and neck    Intensity:   3  Vitals Entered By: Theresia Lo, RN                PCP:  Sharin Grave MD   History of Present Illness: CC: [place] up visit Ms. Gabriela Burke is in good mood and feeling well today she is coming for her 3 months f/u. 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 She is waiting for the results. Her issues discussed today are as follow:  1) Diabetes: Taking her medications w/o difficulty. CBGs fasting in the 100s range. Denies polyuria, poly dipsia, polyphagia. No paresthesias, abdominal pain resolved. No visual changes. Last eye check 2 years ago. HgA1C today : 6.4  2) Blood pressure: Above her goal today. Reports her BP ussually lower than what it is today. Denies headache, chest pain or visual changes. Taking her BP meds w/o difficulty.  3) h/o hematuria: Pt had UA with trace hematuria in September/07. She also had what it was believed to be a traumatic vaginal sppoting no evidenced on speculum exam at a that time (pt. does not have uterus). She currently denies dysuria, fever, chills, tenesmus, dificulty for urination or gross hematuria. I wanted to recheck her UA to confirm trace blood has cleared. UA today is normal w/o blood.  4)Allergic rhinitis: On/off nasal itchiness and rhinorrhea. Saline solution helping but unsure how frequent can she use it. Will prefer not to use nasal steroids.  5)Chronic back pain: Stable. Takes ultram as needed. Not taking Neurontin.  6) Hypercholesterolemia: Taking Zocor 40mg  daily.  7)Health maintenance: Up to date.        Risk Factors:  Tobacco use:  never    Physical Exam  General:  Well-developed,well-nourished,in no acute distress; alert,appropriate and cooperative throughout examination Head:     normocephalic.   Eyes:     vision grossly intact, pupils equal, pupils round, pupils reactive to light, and pupils react to accomodation.   Ears:     R ear normal and L ear normal.   Nose:     Clear rhinorrea. Erythema and sweeling of the turbinates bilaterally. Mouth:     Absent dentition, dental prostesis, no gingival abnormalities, pharynx pink and moist w/o exudates   Neck:     full ROM, no carotid bruits, and no cervical lymphadenopathy.  Thyroid gland difussely enlarged non tender unchanged from prior exams.  Lungs:     Normal respiratory effort. Lungs are clear to auscultation, no crackles or wheezes. Heart:     Normal rate and regular rhythm. S1 and S2 normal without gallop, murmur, click, rub or other extra sounds. Pulses:     R and L dorsalis pedis normal. Extremities:     No LEE. Intact sensation. Normal monofilament test.  RHINITIS, ALLERGIC (ICD-477.9) OBESITY, NOS (ICD-278.00) IRRITABLE BOWEL SYNDROME (ICD-564.1) HYPERTENSION, BENIGN SYSTEMIC (ICD-401.1) HYPERCHOLESTEROLEMIA (ICD-272.0) GOITER NOS (ICD-240.9) BACK PAIN, LOW (ICD-724.2)  Impression & Recommendations:  Problem # 1:  HEMATURIA (ICD-599.7) Resolved.  Trace hematuria on prior visit 9/07. UA today is normal w/o blood.  Orders: Urinalysis  Orders: Urinalysis-[facility] (00000)   Problem # 2:  AODM (ICD-250.00) Stable. Continue current regime. Due for eye exam. Will follow BMET.  Orders: Hgb-A1C Basic Met  Orders: Hgb-A1C-[facility] (16109) Basic Met-[facility] ([id number]) [facility]- Est  Level 4 (91478)   Problem # 3:  RHINITIS, ALLERGIC (ICD-477.9) Use saline solution ad lib. Allegra as needed. Orders: [facility]- Est  Level 4 (29562)   Problem # 4:  HYPERTENSION, BENIGN SYSTEMIC (ICD-401.1) Borderline today. But has been well controled on prior visits. Continue current regime. Pt will monitor  outside clinic and call for early appt. if elevated.  Will recheck next visit if elevated will increase the ACE. Orders: [facility]- Est  Level 4 ([id number])   Problem # 5:  HYPERCHOLESTEROLEMIA (ICD-272.0) Continue Zocor Gave lab appt. for fasting lipid panel prior next visit. Orders: [facility]- Est  Level 4 (13086)   Diabetes Management Assessment/Plan:      The following lipid goals have been established for the patient: Total cholesterol goal of 200; LDL cholesterol goal of 100; HDL cholesterol goal of 40; Triglyceride goal of 200.     Patient Instructions: 1)  Blood pressure is borderline for your goal today. Please recheck outside clinic and if elevated above 130/85 schedule an earlier appt. 2)  Can use nasal saline solution as many times as you want during the day. Can use a humidifier at bed time as weel. 3)  Continue to take your medications as sussual. 4)  Will call you if abnormal lab results. 5)  Please schedule a follow-up appointment in 3 months. 6)  Needs lab appt. prior next visit for Fasting lipid pannel order already in the system.  Laboratory Results   Urine Tests  Date/Time Recieved: August 19, 2006 2:40 PM  Date/Time Reported: August 19, 2006 2:48 PM   Routine Urinalysis   Color: yellow Appearance: clear Glucose: negative   (Normal Range: Negative) Bilirubin: negative   (Normal Range: Negative) Ketone: negative   (Normal Range: Negative) Spec. Gravity: 1.020   (Normal Range: 1.003-1.035) Blood: negative   (Normal Range: Negative) pH: 7.0   (Normal Range: 5.0-8.0) Protein: negative   (Normal Range: Negative) Urobilinogen: 0.2   (Normal Range: 0-1) Nitrite: negative   (Normal Range: Negative) Leukocyte Esterace: negative   (Normal Range: Negative)     Blood Tests   Date/Time Recieved: August 19, 2006 1:40PM  Date/Time Reported: August 19, 2006 1:49 PM   HGBA1C: 6.4%   (Normal Range: Non-Diabetic - 3-6%   Control Diabetic - 6-8%)  CBC

## 2010-07-09 NOTE — Assessment & Plan Note (Signed)
Summary: lower abd pain x 4 days.   Vital Signs:  Patient profile:   60 year old female Height:      65 inches Weight:      164 pounds BMI:     27.39 BSA:     1.82 Temp:     98.6 degrees F Pulse rate:   79 / minute BP sitting:   117 / 71  Vitals Entered By: Jone Baseman CMA (September 29, 2008 9:35 AM) CC: lower abd pain x 2 weeks Pain Assessment Patient in pain? yes     Location: lower abd Intensity: 10    History of Present Illness: 60 yo female new to me with h/o HTN, DM, and HLD who presents with 2 week h/o lower abdominal pain.  Abdominal pain- LLQ  that radiates to her back.  Started intermittently two weeks ago but since last Friday has been constant.  Has had some mild constipation, but pain not relieved after she has a BM with a laxative.  Denies any diarrhea, bloody stools, nausea, vomitting, dysuria, fever, or vaginal bleeding.  She is not sexually active.  Does admit to drinking less over the past few weeks because she has been very busy.  Pain is not worsened with eating.  She cannot think of any aggrevating or alleviating problems.  She has never had anything like this before.  She is s/p total hysterectomy for benign reasons.  S/p oophorectomy for unknown reasons.  Hga1c was 6/9 in March.  No h/o diverticulosis, kidney stones,  or gallstones.  Medical history and medications reviewed.  Habits & Providers     Tobacco Status: never  Allergies: 1)  ! * Latex  Review of Systems      See HPI General:  Denies chills, fatigue, fever, loss of appetite, malaise, and weight loss. GI:  Complains of constipation; denies bloody stools, dark tarry stools, diarrhea, nausea, vomiting, and yellowish skin color.  Physical Exam  General:  Well-developed,well-nourished,in no acute distress; alert,appropriate and cooperative throughout examination Abdomen:  Bowel sounds positive,abdomen slightly firm, tender to palpation over left lower quadrant.  No rebound.  No  guarding. Rectal:  No external abnormalities noted. Normal sphincter tone. No rectal masses or tenderness. Hemocult negative.   Impression & Recommendations:  Problem # 1:  ABDOMINAL PAIN (ICD-789.00) Assessment New Diiff dx is wide, including diverticulosis/diverticulitis, kidney stones, pyelo, obstruction, constipation, malignancy..etc.  Guaiac neg. UA neg for infection but pos for blood.  Given progressive symptoms along with physical exam and degree of pain with go ahead and get an abdominal CT with and without contrast.  CMET and lipase as well. Orders: Urinalysis-[facility] (00000) Comp Met-[facility] [phone]) Lipase-[facility] [id number]) CT with & without Contrast (CT w&w/o Contrast) [facility]- Est Level  3 ([id number])  Complete Medication List: 1)  Simvastatin 40 Mg Tabs (Simvastatin) .Marland Kitchen.. 1 tablet by mouth daily 2)  Glucophage Xr 750 Mg Tb24 (Metformin hcl) .... Take one by mouth once daily 3)  Hydrochlorothiazide 25 Mg Tabs (Hydrochlorothiazide) .... Take one by mouth once daily 4)  Lisinopril 10 Mg Tabs (Lisinopril) .... Take one by mouth once daily 5)  Neurontin 300 Mg Caps (Gabapentin) .... Take 1 capsule by mouth three times a day 6)  Athletes Foot 1 % Crea (Terbinafine hcl) .... Apply to aa two times a day  Laboratory Results   Urine Tests  Date/Time Received: September 29, 2008 10:12 AM  Date/Time Reported: September 29, 2008 11:18 AM   Routine Urinalysis   Color: yellow Appearance:  Clear Glucose: negative   (Normal Range: Negative) Bilirubin: negative   (Normal Range: Negative) Ketone: trace (5)   (Normal Range: Negative) Spec. Gravity: >=1.030   (Normal Range: 1.003-1.035) Blood: small   (Normal Range: Negative) pH: 5.5   (Normal Range: 5.0-8.0) Protein: negative   (Normal Range: Negative) Urobilinogen: 0.2   (Normal Range: 0-1) Nitrite: negative   (Normal Range: Negative) Leukocyte Esterace: negative   (Normal Range: Negative)  Urine Microscopic WBC/HPF: rare RBC/HPF:  0-3 Bacteria/HPF: 1+ Epithelial/HPF: occ    Comments: ...........test performed by...........Marland KitchenTerese Door, CMA

## 2010-07-09 NOTE — Assessment & Plan Note (Signed)
Summary: R knee pain/kh   Vital Signs:  Patient profile:   60 year old female BP sitting:   134 / 80  Vitals Entered By: Lillia Pauls CMA (Oct 26, 2009 10:38 AM)   Primary Care Provider:  Eustaquio Boyden  MD   History of Present Illness: DATE of INJURY (approximate): 10/06/2009 Right knee pain for several weeks. Started when she came down off a ladder, last step she felt a "pinch" in her knee. It swelled some and has been painful and tight ever since. Does not give way. Cannot kneel comfortabley. No numbness or tingling  Allergies: 1)  ! * Latex  Physical Exam  General:  alert and well-developed.   Msk:  Right knee full extension and flexion--some pain with full flexion. Ligamentously intact. No joint line tenderness. popliteal space is full. calf is soft and non tender.  negative McMurray Additional Exam:  US exam (limited). Right knee popliuteal space loculated baler'scyst. Some suprapatellar pouch fluid. Mensici not well defined but I see no abnormality grossly.  Patient given informed consent for injection. Discussed possible complications of infection, bleeding or skin atrophy at site of injection. Possible side effect of avascular necrosis (focal area of bone death) due to steroid use.Appropriate verbal time out taken Are cleaned and prepped in usual sterile fashion. A --1-- cc kennalog plus --4--cc 1% lidocaine without epinephrine was injected into the--right knee using anterior approach-. Patient tolerated procedure well with no complications.    Impression & Recommendations:  Problem # 1:  KNEE PAIN, RIGHT (ICD-719.46)  Her updated medication list for this problem includes:    Ibuprofen 800 Mg Tabs (Ibuprofen) ..... One by mouth three times a day with food. I think she has a small (p[robably posterior horn ) meniscal tear with Baker's cyst. We discussed options. She has improved from original doi but is still having signs so we injected today. F/u 3 w if no  better.  Orders: [place] LIMITED (16109) Joint Aspirate / Injection, Large (20610) Kenalog 10 mg inj ([id number])  Complete Medication List: 1)  Glucophage Xr 750 Mg Tb24 (Metformin hcl) .... Take one by mouth once daily 2)  Hydrochlorothiazide 12.5 Mg Tabs (Hydrochlorothiazide) .... Take 1 tab  by mouth every morning 3)  Lisinopril 10 Mg Tabs (Lisinopril) .... Take one by mouth once daily 4)  Simvastatin 40 Mg Tabs (Simvastatin) .Marland Kitchen.. 1 tablet by mouth daily 5)  Zyrtec Allergy 10 Mg Tabs (Cetirizine hcl) .... Nightly 6)  Flonase 50 Mcg/act Susp (Fluticasone propionate) .... Two puffs in each nostril daily 7)  Acyclovir 800 Mg Tabs (Acyclovir) .Marland Kitchen.. 1 tab by mouth 5 times a day for 7 days 8)  Hydrocortisone 2.5 % Crea (Hydrocortisone) .... Apply to itching area as needed disp: 80g 9)  Ibuprofen 800 Mg Tabs (Ibuprofen) .... One by mouth three times a day with food. 10)  Funginail  .... Daily to nail

## 2010-07-09 NOTE — Assessment & Plan Note (Signed)
Summary: does not feel better/[place]   Vital Signs:  Patient profile:   60 year old female Height:      65 inches Weight:      157 pounds BMI:     26.22 Temp:     98.2 degrees F oral Pulse rate:   88 / minute BP sitting:   123 / 78  (left arm) Cuff size:   regular  Vitals Entered By: Tessie Fass, CMA (July 05, 2009 8:58 AM) CC: cold symptoms Is Patient Diabetic? No   Primary Care Provider:  Eustaquio Boyden  MD  CC:  cold symptoms.  History of Present Illness: 60 y/o female here for f/u   1) persistent URI sxs: present 6 days. Relieved by OTC throat spray. Also with productive cough with clear/yellow phlegm, runny nose, fatigue. denies fevers/chills. states sxs have worsened since eval 3 days ago. has developed N/V since last evaluation, but unable to quantify and has not loss any weight. denies diarrhea, specific sick contacts, dysuria, chest pain, edema, headache, presyncope.   Habits & Providers  Alcohol-Tobacco-Diet     Tobacco Status: never  Current Medications (verified): 1)  Simvastatin 40 Mg  Tabs (Simvastatin) .Marland Kitchen.. 1 Tablet By Mouth Daily 2)  Glucophage Xr 750 Mg Tb24 (Metformin Hcl) .... Take One By Mouth Once Daily 3)  Hydrochlorothiazide 12.5 Mg  Tabs (Hydrochlorothiazide) .... Take 1 Tab  By Mouth Every Morning 4)  Lisinopril 10 Mg Tabs (Lisinopril) .... Take One By Mouth Once Daily 5)  Funginail .... Daily To Nail 6)  Tessalon Perles 100 Mg Caps (Benzonatate) .... One Tab By Mouth Q 6hrs As Needed Cough  Allergies (verified): 1)  ! * Latex  Physical Exam  General:  vitals reviewed. overweight, NAD. vitals reviewed. trash can at her feet, but on active vomiting noted.  Mouth:  MMM Neck:  no lymphadenopathy   Lungs:  Normal respiratory effort, chest expands symmetrically. Lungs are clear to auscultation, no crackles or wheezes. Heart:  Normal rate and regular rhythm. S1 and S2 normal without gallop, murmur, click, rub or other extra  sounds.   Impression & Recommendations:  Problem # 1:  URI (ICD-465.9) Assessment Deteriorated  patient declines anti-emetic. red flag given. possibly influenza, although patient reports being vaccinated at work.  supportive care only.   Her updated medication list for this problem includes:    Tessalon Perles 100 Mg Caps (Benzonatate) ..... One tab by mouth q 6hrs as needed cough  Orders: [facility]- Est Level  3 (21308)  Patient Instructions: 1)  Nice to have met you! 2)  If you develop fever, productive cough, trouble breathing, chest pain or other concerning symptoms, please call our office.  3)  Get plenty of rest, drink lots of clear liquids, and use Tylenol or Ibuprofen for fever and comfort.

## 2010-07-09 NOTE — Assessment & Plan Note (Signed)
Summary: F/U DM HTN   Vital Signs:  Patient Profile:   60 Years Old Female Height:     64.5 inches Weight:      163 pounds BMI:     27.65 Temp:     98.4 degrees F Pulse rate:   69 / minute BP sitting:   133 / 82  (left arm)  Pt. in pain?   no  Vitals Entered ByJacki Cones RN (May 08, 2008 9:07 AM)                Vision Comments: 03/2009   PCP:  Eustaquio Boyden  MD  Chief Complaint:  3 mo f/u HTN and DM.  History of Present Illness: 1. DM - A1c 6.2 -> 7.2.  Pt recently on "potato chip diet".  Out of work x several months 2/2 shoulder sx, has not been exercising. 6 lb weight gain since last visit. not keeping track of sugars.  recent eye exam with background diabetic retinopathy.  2. HTN - on HCTZ, lisinopril.  compliant.  no HA, vision changes, chest pain/ tightness.  3. L rotator cuff tear - surgery 8/09.  PT 3x/wk.  doingwell, currently on oxycodone, only to return to work once feeling better.  4. memory - trouble with memory recently, concerned as parents with alzheimers.    Current Allergies: ! * LATEX  Past Medical History:    cardiac stress test: OK - 11/07/2005, carotid duples -8/05 - no sig stenosis - 02/08/2004, Echo-9/05-EF 60-65%, no veggies - 02/20/2004, Head CT- Normal - 09/07/2005, mild subchondral cyst R femoral head -, PPD negative 6/00 -, spine/hip films=L5S1 disk narrowing,spondylosisL4L5 - 06/09/2004  Past Surgical History:    Rotator cuff surgery 01/11/08.        s/p hysterectomy 60s (60 yo for large fibroid tumor), oophorectomy at age 52    Last Flu Vaccine:  given (03/15/2007 1:06:25 PM) Flu Vaccine Result Date:  03/23/2008 Flu Vaccine Result:  given Flu Vaccine Next Due:  1 yr    Physical Exam  General:     Well-developed,well-nourished,in no acute distress; alert,appropriate and cooperative throughout examination Mouth:     Oral mucosa and oropharynx without lesions or exudates. Lungs:     Normal respiratory effort, chest expands  symmetrically. Lungs are clear to auscultation, no crackles or wheezes. Heart:     Normal rate and regular rhythm. S1 and S2 normal without gallop, murmur, click, rub or other extra sounds.  Diabetes Management Exam:    Foot Exam (with socks and/or shoes not present):       Sensory-Pinprick/Light touch:          Left medial foot (L-4): normal          Left dorsal foot (L-5): normal          Left lateral foot (S-1): normal          Right medial foot (L-4): normal          Right dorsal foot (L-5): normal          Right lateral foot (S-1): normal       Sensory-Monofilament:          Left foot: normal          Right foot: normal       Inspection:          Left foot: normal          Right foot: normal       Nails:  Left foot: normal          Right foot: fungal infection    Eye Exam:       Eye Exam done elsewhere          Date: 03/07/2008          Results: diabetic retinopathy          Done by: Dr. Charlotte Sanes    Impression & Recommendations:  Problem # 1:  HYPERTENSION, BENIGN SYSTEMIC (ICD-401.1) Assessment: Deteriorated stable, likely slight deterioration from poor diet.  Pt states she will start watching what she eats again.  Monitor at next viist.  If still above goal, increase ACEI to 20 and combine into combo pill.  Her updated medication list for this problem includes:    Hydrochlorothiazide 25 Mg Tabs (Hydrochlorothiazide) .Marland Kitchen... Take one by mouth once daily    Lisinopril 10 Mg Tabs (Lisinopril) .Marland Kitchen... 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  Pt reports she will get back on track, start exercising and watching diet.   Her  updated medication list for this problem includes:    Glucophage Xr 750 Mg Tb24 (Metformin hcl) .Marland Kitchen... Take one by mouth once daily    Lisinopril 10 Mg Tabs (Lisinopril) .Marland Kitchen... Take one by mouth once daily  Orders: A1C-[facility] (53664) [facility]- Est  Level 4 (40347)  Labs Reviewed: HgBA1c: 7.2 (05/08/2008)   Creat: 0.82 (03/03/2008)   Microalbumin: neg (01/08/2006)  Last Eye Exam: background retinopathy (03/07/2008)   Problem # 3:  SKIN RASH (ICD-782.1) Assessment: New ? tinea pedis vs dry skin.  trial of lamisil and to return for f/u.   Her updated medication list for this problem includes:    Athletes Foot 1 % Crea (Terbinafine hcl) .Marland Kitchen... Apply to aa two times a day  Orders: [facility]- Est  Level 4 (42595)   Problem # 4:  GOITER NOS (ICD-240.9) Assessment: Comment Only may need TSH in near future  Complete Medication List: 1)  Simvastatin 40 Mg Tabs (Simvastatin) .Marland Kitchen.. 1 tablet by mouth daily 2)  Glucophage Xr 750 Mg Tb24 (Metformin hcl) .... Take one by mouth once daily 3)  Hydrochlorothiazide 25 Mg Tabs (Hydrochlorothiazide) .... Take one by mouth once daily 4)  Lisinopril 10 Mg Tabs (Lisinopril) .... Take one by mouth once daily 5)  Neurontin 300 Mg Caps (Gabapentin) .... Take 1 capsule by mouth three times a day 6)  Athletes Foot 1 % Crea (Terbinafine hcl) .... Apply to aa two times a day   Patient Instructions: 1)  Return in 3 months for a complete physical (February) 2)  I have sent a medicine to your pharmacy for the right foot, and refills on your meds(changing your blood pressure medicine to one combo pill). 3)  start walking again and continue to eat more fruits and vegetables (no more potato chip diet!). 4)  Call clinic with any questions.   Prescriptions: LISINOPRIL 10 MG TABS (LISINOPRIL) take one by mouth once daily  #90 x 2   Entered and Authorized by:   Eustaquio Boyden  MD   Signed by:   Eustaquio Boyden  MD on 05/08/2008   Method used:   Electronically  to        [facility] [address] [phone]* (retail)       [address]       [place], [place]  [address]       Ph: [phone]       Fax: [phone]   RxID:   [id number] HYDROCHLOROTHIAZIDE 25 MG TABS (HYDROCHLOROTHIAZIDE) Take one by mouth once daily  #90 x 2   Entered and Authorized by:   Eustaquio Boyden  MD   Signed by:   Eustaquio Boyden  MD on 05/08/2008   Method used:   Electronically to        [facility] [address] [phone]* (retail)       [address]       [place], [place]  [address]       Ph: [phone]       Fax: [phone]   RxID:   [id number] GLUCOPHAGE XR 750 MG TB24 (METFORMIN HCL) Take one by mouth once daily  #31 x 5   Entered and Authorized by:   Eustaquio Boyden  MD   Signed by:   Eustaquio Boyden  MD on 05/08/2008   Method used:   Electronically to        [facility] [address] [phone]* (retail)       [address]       [place], [place]  [address]       Ph: [phone]       Fax: [phone]   RxID:   [id number] SIMVASTATIN 40 MG  TABS (SIMVASTATIN) 1 tablet by mouth daily  #31 x 5   Entered and Authorized by:   Eustaquio Boyden  MD   Signed by:   Eustaquio Boyden  MD on 05/08/2008   Method used:   Electronically to        [facility] [address] [phone]* (retail)       [address]       [place], [place]  [address]       Ph: [phone]       Fax: [phone]   RxID:   [id number] ATHLETES FOOT 1 % CREA (TERBINAFINE HCL) apply to aa two times a day  #1 x 1   Entered and Authorized by:   Eustaquio Boyden  MD   Signed by:   Eustaquio Boyden  MD on 05/08/2008   Method used:   Electronically to        [facility] [address] [phone]* (retail)       [address]       [place], [place]  [address]       Ph: [phone]       Fax: [phone]   RxID:   [id number]  ] Laboratory Results   Blood Tests   Date/Time Received: May 08, 2008 9:13 AM  Date/Time Reported: May 08, 2008 9:45 AM   HGBA1C: 7.2%   (Normal Range:  Non-Diabetic - 3-6%   Control Diabetic - 6-8%)  Comments: ...........test performed by...........Marland KitchenTerese Door, CMA

## 2010-07-09 NOTE — Assessment & Plan Note (Signed)
Summary: cold,tcb   Vital Signs:  Patient profile:   60 year old female Height:      65 inches Weight:      159 pounds BMI:     26.55 Temp:     98.0 degrees F oral Pulse rate:   75 / minute BP sitting:   117 / 70  (left arm) Cuff size:   regular  Vitals Entered By: Tessie Fass, CMA (July 26, 2009 10:41 AM) CC: cold? Is Patient Diabetic? Yes Pain Assessment Patient in pain? yes     Location: abdomen Intensity: 7   Primary Care Provider:  Eustaquio Boyden  MD  CC:  cold?Marland Kitchen  History of Present Illness: CC: f/u cold  1. f/u labwork - all normal.  2. cold - Still with cold sxs.  see previous 2 clinic notes.  Still feeling fatigued.  Blowing nose mucous and bloody coming out.  Still coughing, but better.  No fevers, chills.  No sinus pressure or facial.  + HA worse with leaning forward.  Throbbing.  + congested.  thinks abd pain from cough.  Habits & Providers  Alcohol-Tobacco-Diet     Tobacco Status: never  Current Medications (verified): 1)  Simvastatin 40 Mg  Tabs (Simvastatin) .Marland Kitchen.. 1 Tablet By Mouth Daily 2)  Glucophage Xr 750 Mg Tb24 (Metformin Hcl) .... Take One By Mouth Once Daily 3)  Hydrochlorothiazide 12.5 Mg  Tabs (Hydrochlorothiazide) .... Take 1 Tab  By Mouth Every Morning 4)  Lisinopril 10 Mg Tabs (Lisinopril) .... Take One By Mouth Once Daily 5)  Funginail .... Daily To Nail 6)  Tessalon Perles 100 Mg Caps (Benzonatate) .... One Tab By Mouth Q 6hrs As Needed Cough 7)  Augmentin 500-125 Mg Tabs (Amoxicillin-Pot Clavulanate) .... Take On By Mouth Three Times A Day X 10 Days 8)  Flonase 50 Mcg/act Susp (Fluticasone Propionate) .... Two Puffs in Each Nostril Daily  Allergies (verified): 1)  ! * Latex  Physical Exam  General:  vitals reviewed. overweight, NAD. vitals reviewed. Head:  Normocephalic and atraumatic without obvious abnormalities. No apparent alopecia or balding. Eyes:  no conjunctivitis or drainage  Ears:  TMs clear bilaterally, no  fluid behind ears. Nose:  no rhinorrhea, turbinates not erythematous or boggy. Mouth:  MMM, + fever sore right upper lip Neck:  + R AC LAD Lungs:  Normal respiratory effort, chest expands symmetrically. Lungs are clear to auscultation, no crackles or wheezes. Heart:  Normal rate and regular rhythm. S1 and S2 normal without gallop, murmur, click, rub or other extra sounds. Abdomen:  soft, +BS, non tender  Extremities:  No clubbing, cyanosis, edema, or deformity noted with normal full range of motion of all joints.   Skin:  Intact without suspicious lesions or rashes   Impression & Recommendations:  Problem # 1:  SINUSITIS, ACUTE (ICD-461.9)  given initially had cold sxs then progressed to sinus sxs, congestion, has been going on for >3wks, will treat as bacterial sinusitis.  Also start flonase daily.  RTC 3 wks if not improving, sooner if red flags.  Her updated medication list for this problem includes:    Tessalon Perles 100 Mg Caps (Benzonatate) ..... One tab by mouth q 6hrs as needed cough    Augmentin 500-125 Mg Tabs (Amoxicillin-pot clavulanate) .Marland Kitchen... Take on by mouth three times a day x 10 days    Flonase 50 Mcg/act Susp (Fluticasone propionate) .Marland Kitchen..Marland Kitchen Two puffs in each nostril daily  Orders: [facility]- Est Level  3 (04540)  Instructed on  treatment. Call if symptoms persist or worsen.   Complete Medication List: 1)  Simvastatin 40 Mg Tabs (Simvastatin) .Marland Kitchen.. 1 tablet by mouth daily 2)  Glucophage Xr 750 Mg Tb24 (Metformin hcl) .... Take one by mouth once daily 3)  Hydrochlorothiazide 12.5 Mg Tabs (Hydrochlorothiazide) .... Take 1 tab  by mouth every morning 4)  Lisinopril 10 Mg Tabs (Lisinopril) .... Take one by mouth once daily 5)  Funginail  .... Daily to nail 6)  Tessalon Perles 100 Mg Caps (Benzonatate) .... One tab by mouth q 6hrs as needed cough 7)  Augmentin 500-125 Mg Tabs (Amoxicillin-pot clavulanate) .... Take on by mouth three times a day x 10 days 8)  Flonase 50 Mcg/act  Susp (Fluticasone propionate) .... Two puffs in each nostril daily  Patient Instructions: 1)  Anitibiotic for sinuses.  Take three times daily for 10 days. 2)  intranasal steroid to use daily 2 puffs in each nostril. 3)  Call me to let me know how you are doing. 4)  Consider neti pot. Prescriptions: FLONASE 50 MCG/ACT SUSP (FLUTICASONE PROPIONATE) two puffs in each nostril daily  #1 x 1   Entered and Authorized by:   Eustaquio Boyden  MD   Signed by:   Eustaquio Boyden  MD on 07/26/2009   Method used:   Electronically to        [facility] [address] [phone]* (retail)       [address]       [place], [place]  [address]       Ph: [phone]       Fax: [phone]   RxID:   [id number] AUGMENTIN 500-125 MG TABS (AMOXICILLIN-POT CLAVULANATE) take on by mouth three times a day x 10 days  #30 x 0   Entered and Authorized by:   Eustaquio Boyden  MD   Signed by:   Eustaquio Boyden  MD on 07/26/2009   Method used:   Electronically to        [facility] [address] [phone]* (retail)       [address]       [place], [place]  [address]       Ph: [phone]       Fax: [phone]   RxID:   [id number]

## 2010-07-09 NOTE — Letter (Signed)
Summary: Results Follow-up Letter  [facility]  [address]   [place], [place] [address]   Phone: [phone]  Fax: [phone]    08/17/2008  [address] [place], [place]  [address]  Dear Ms. Mannes,   The following are the results of your recent test(s):  _________________________________________________________  Your thyroid function was normal.  Your A1c was good at 6.9%.  You did have a little bit of protein in your urine, but you are already on medicine that will help with that (lisinopril).  Keep up the good work!  Call clinic with any questions.  _________________________________________________________   Sincerely,  Eustaquio Boyden  MD [facility]

## 2010-07-09 NOTE — Progress Notes (Signed)
Summary: triage  Phone Note Call from Patient Call back at Home Phone [phone]   Caller: Patient Summary of Call: Pt was seen this week and she is not feeling any better.   Initial call taken by: Clydell Hakim,  July 04, 2009 4:14 PM  Follow-up for Phone Call        was here 2 days ago . told her it may take more than 2 days to feel better. she wants to be seen. put in geri clinic at 9am Follow-up by: Golden Circle RN,  July 04, 2009 4:17 PM

## 2010-07-09 NOTE — Consult Note (Signed)
Summary: [facility] - monitor diabetic retinopathy  [facility]   Imported By: Knox Royalty 03/20/2008 14:54:57  _____________________________________________________________________  External Attachment:    Type:   Image     Comment:   External Document  Appended Document: [facility] - monitor diabetic retinopathy    Clinical Lists Changes  Problems: Added new problem of BACKGROUND DIABETIC RETINOPATHY (ICD-362.01) Changed problem from DM, UNCOMPLICATED, TYPE II (ICD-250.00) to DIABETES MELLITUS, TYPE II, WITH RETINOPATHY (ICD-250.50) Observations: Added new observation of DMEYEEXAMNXT: 03/07/2009 (03/26/2008 17:43) Added new observation of HGBA1CNXTDUE: 02/24/2008 (03/26/2008 17:43) Added new observation of HEMOCULTDUE: Not Indicated (03/26/2008 17:43) Added new observation of HDLNXTDUE: 03/03/2009 (03/26/2008 17:43) Added new observation of LDLNXTDUE: 03/03/2009 (03/26/2008 17:43) Added new observation of DBT EY CK DT: 03/07/2008 (03/07/2008 17:47) Added new observation of DIAB EYE EX: background retinopathy (03/07/2008 17:47) Added new observation of DBT EY CK DT: 03/07/2008 (03/07/2008 17:46) Added new observation of DIAB EYE EX: abnormal (03/07/2008 17:46)      Last HDL:  62 (03/03/2008 9:16:00 PM) HDL Next Due:  1 yr Last LDL:  107 (03/03/2008 9:16:00 PM) LDL Next Due:  1 yr Last Hemoccult Result: Done. (05/09/2002 12:00:00 AM) Hemoccult Next Due:  Not Indicated Last Diabetes Eye Exam:  diabetic retinopathy (03/03/2007 6:58:10 PM) Diabetic Eye Exam Date:  03/07/2008 Diabetes Eye Exam Result:  background retinopathy Diabetes Eye Exam Due:  1 yr Last HGBA1C:  6.5 (08/24/2007 12:19:37 PM) HGBA1C Next Due:  6 mo

## 2010-07-09 NOTE — Miscellaneous (Signed)
  Clinical Lists Changes  Observations: Added new observation of MAMMO DUE: 12/2007 (12/10/2006 14:37) Added new observation of MAMMOGRAM: normal (11/23/2006 14:38)      Preventive Care Screening  Mammogram:    Date:  11/23/2006    Next Due:  12/2007    Results:  normal

## 2010-07-09 NOTE — Assessment & Plan Note (Signed)
Summary: fu DM/HTN wp   Vital Signs:  Patient Profile:   60 Years Old Female Height:     64.5 inches Weight:      167.0 pounds BMI:     28.32 Temp:     97.2 degrees F oral Pulse rate:   90 / minute BP sitting:   122 / 74  (left arm)  Vitals Entered By: Modesta Messing LPN (July 18, 2008 9:46 AM)                  PCP:  Eustaquio Boyden  MD  Chief Complaint:  Follow up visit.  c/o right leg and feet "heaviness"  and numbness.Marland Kitchen  History of Present Illness: 1. DM - A1c 7.2.  recheck after 2/30/10.  recently started work again, states starting to exercise and be more active.  4lb weight gain since last viist.  Up to date on eye exam and foot exam.  2. HTN - best control to date.  on HCTZ, lisinopril.  compliant.  no HA, vision changes, chest pain/ tightness.  3. L rotator cuff tear - surgery 8/09.  PT 3x/wk.  finished.  starting work again.  doingwell, currently on oxycodone  4. foot heaviness/left 1st MT joint swelling - occurred Sunday while at church, felt like she was standing on paper, nontender, no numbness or warmth or erythema.  Today feels back to normal.  Stopped neurontin.    Current Allergies: ! * LATEX      Physical Exam  General:     Well-developed,well-nourished,in no acute distress; alert,appropriate and cooperative throughout examination Lungs:     Normal respiratory effort, chest expands symmetrically. Lungs are clear to auscultation, no crackles or wheezes. Heart:     Normal rate and regular rhythm. S1 and S2 normal without gallop, murmur, click, rub or other extra sounds. Msk:     feet bilaterally without deformity, good preserved long and trans arches.  bilateral MTP joints without swelling/erythema/edema. Extremities:     no edema    Impression & Recommendations:  Problem # 1:  DIABETES MELLITUS, TYPE II, WITH RETINOPATHY (ICD-250.50) Assessment: Unchanged return next month for A1c check as well as TSH.  microalb today trace (last  check negative 2007).  pt on ACEI.  Her updated medication list for this problem includes:    Glucophage Xr 750 Mg Tb24 (Metformin hcl) .Marland Kitchen... Take one by mouth once daily    Lisinopril 10 Mg Tabs (Lisinopril) .Marland Kitchen... Take one by mouth once daily  Orders: UA Microalbumin-[facility] (45409) [facility]- Est  Level 4 (81191)  Future Orders: A1C-[facility] ([id number]) ... 07/25/2009   Problem # 2:  HYPERTENSION, BENIGN SYSTEMIC (ICD-401.1) Assessment: Improved stable, best control to date.  continue meds. Her updated medication list for this problem includes:    Hydrochlorothiazide 25 Mg Tabs (Hydrochlorothiazide) .Marland Kitchen... Take one by mouth once daily    Lisinopril 10 Mg Tabs (Lisinopril) .Marland Kitchen... Take one by mouth once daily  BP today: 122/74 Prior BP: 133/82 (05/08/2008)  Labs Reviewed: Creat: 0.82 (03/03/2008) Chol: 183 (03/03/2008)   HDL: 62 (03/03/2008)   LDL: 107 (03/03/2008)   TG: 70 (03/03/2008)  Orders: [facility]- Est  Level 4 (99214)   Problem # 3:  HYPERCHOLESTEROLEMIA (ICD-272.0) Assessment: Unchanged pt reports not taking last visit.  started taking again.  consider LDL check in a few months to assess response to treatment. Her updated medication list for this problem includes:    Simvastatin 40 Mg Tabs (Simvastatin) .Marland Kitchen... 1 tablet by mouth daily  Labs Reviewed: Chol: 183 (03/03/2008)   HDL: 62 (03/03/2008)   LDL: 107 (03/03/2008)   TG: 70 (03/03/2008)  Orders: [facility]- Est  Level 4 (99214)   Problem # 4:  GOITER NOS (ICD-240.9) Assessment: Unchanged check tsh at next lab draw. Orders: [facility]- Est  Level 4 ([address])  Future Orders: TSH-[facility] ([id number]) ... 07/18/2009   Complete Medication List: 1)  Simvastatin 40 Mg Tabs (Simvastatin) .Marland Kitchen.. 1 tablet by mouth daily 2)  Glucophage Xr 750 Mg Tb24 (Metformin hcl) .... Take one by mouth once daily 3)  Hydrochlorothiazide 25 Mg Tabs (Hydrochlorothiazide) .... Take one by mouth once daily 4)  Lisinopril 10 Mg Tabs (Lisinopril) .... Take one by mouth once  daily 5)  Neurontin 300 Mg Caps (Gabapentin) .... Take 1 capsule by mouth three times a day 6)  Athletes Foot 1 % Crea (Terbinafine hcl) .... Apply to aa two times a day   Patient Instructions: 1)  Please return march for HgA1c check. (lab visit only). 2)  Return in 3 months for f/u. 3)  Congratulations on your blood pressure!  We will check blood work at your next visit. 4)  Good to see you today!  Call clinic with any questions.    Laboratory Results   Urine Tests  Date/Time Received: July 18, 2008 11:44 AM  Date/Time Reported: July 18, 2008 11:48 AM   Microalbumin (urine): trace mg/L   Comments: ............test performed by...........Marland Kitchen Terese Door, CMA     Appended Document: added orders for further labs/UA at next visit. trace microalb on dip last visit.  Will need UA as well as check Cr to ensure still normal.   Clinical Lists Changes  Problems: Added new problem of MICROALBUMINURIA (ICD-791.0) Orders: Added new Test order of Basic Met-[facility] [phone]) - Signed Added new Test order of Urinalysis-[facility] (00000) - Signed

## 2010-07-09 NOTE — Miscellaneous (Signed)
Summary: Orders Update  Clinical Lists Changes 

## 2010-08-05 ENCOUNTER — Encounter: Payer: Self-pay | Admitting: Family Medicine

## 2010-08-05 ENCOUNTER — Ambulatory Visit (INDEPENDENT_AMBULATORY_CARE_PROVIDER_SITE_OTHER): Payer: BC Managed Care – PPO | Admitting: Family Medicine

## 2010-08-05 VITALS — BP 153/81 | HR 98 | Temp 98.7°F | Ht 64.5 in | Wt 162.5 lb

## 2010-08-05 DIAGNOSIS — J019 Acute sinusitis, unspecified: Secondary | ICD-10-CM

## 2010-08-05 MED ORDER — CETIRIZINE HCL 10 MG PO TABS: 10.0000 mg | ORAL_TABLET | Freq: Every day | ORAL | Status: DC

## 2010-08-05 MED ORDER — IBUPROFEN 600 MG PO TABS: 600.0000 mg | ORAL_TABLET | Freq: Four times a day (QID) | ORAL | Status: DC | PRN

## 2010-08-05 MED ORDER — SALINE NASAL SPRAY 0.65 % NA SOLN: 1.0000 | NASAL | Status: AC | PRN

## 2010-08-05 MED ORDER — BENZONATATE 100 MG PO CAPS: 100.0000 mg | ORAL_CAPSULE | Freq: Four times a day (QID) | ORAL | Status: DC | PRN

## 2010-08-05 MED ORDER — OXYMETAZOLINE HCL 0.05 % NA SOLN: 2.0000 | Freq: Two times a day (BID) | NASAL | Status: AC

## 2010-08-05 NOTE — Progress Notes (Signed)
  Subjective:    Patient ID: Gabriela Burke, female    DOB: 04/03/1951, 60 y.o.   MRN: [id number]  Sinusitis This is a new problem. The current episode started yesterday. The problem has been waxing and waning since onset. There has been no fever. Her pain is at a severity of 2/10. The pain is mild. Associated symptoms include congestion, coughing, a hoarse voice, sinus pressure and a sore throat. Past treatments include oral decongestants. The treatment provided mild relief.      Review of Systems  HENT: Positive for congestion, sore throat, hoarse voice and sinus pressure.   Respiratory: Positive for cough.        Objective:   Physical Exam  Constitutional: She appears well-developed and well-nourished.          Assessment & Plan:  Pt. Is a 60 y/o aaf with acute viral sinusitis of 2 days duration. 1. Sinusitis - symptomatic treatment. No antibiotics. Pt. To return if fever.

## 2010-08-20 ENCOUNTER — Other Ambulatory Visit: Payer: Self-pay | Admitting: *Deleted

## 2010-08-20 DIAGNOSIS — E1139 Type 2 diabetes mellitus with other diabetic ophthalmic complication: Secondary | ICD-10-CM

## 2010-08-20 MED ORDER — METFORMIN HCL ER 750 MG PO TB24: 750.0000 mg | ORAL_TABLET | Freq: Every day | ORAL | Status: DC

## 2010-08-22 ENCOUNTER — Other Ambulatory Visit: Payer: Self-pay | Admitting: *Deleted

## 2010-08-22 DIAGNOSIS — E7800 Pure hypercholesterolemia, unspecified: Secondary | ICD-10-CM

## 2010-08-22 MED ORDER — SIMVASTATIN 40 MG PO TABS: 40.0000 mg | ORAL_TABLET | Freq: Every day | ORAL | Status: DC

## 2010-08-27 LAB — D-DIMER, QUANTITATIVE: D-Dimer, Quant: 0.48 ug/mL-FEU (ref 0.00–0.48)

## 2010-10-22 NOTE — Op Note (Signed)
NAMECLARABELL, MATSUOKA          ACCOUNT NO.:  [id number]   MEDICAL RECORD NO.:  [id number]          PATIENT TYPE:  AMB   LOCATION:  DAY                          FACILITY:  [facility]   PHYSICIAN:  Deidre Ala, M.D.    DATE OF BIRTH:  1950-11-17   DATE OF PROCEDURE:  01/11/2008  DATE OF DISCHARGE:                               OPERATIVE REPORT   PREOPERATIVE DIAGNOSIS:  1. Impingement left shoulder with type 3-4 acromion.  2. Severe long head biceps tendinopathy.  3. Osteoarthritis of acromioclavicular joint.  4. Full-thickness distal rotator cuff tear.   POSTOPERATIVE DIAGNOSIS:  1. Impingement left shoulder with type 3-4 acromion.  2. Repairable rotator cuff avulsion distally.  3. Superior labrum anterior-posterior tear, with biceps tendonitis,      severe, unstable  4. Mild glenohumeral degenerative joint disease .  5. Osteoarthritis of acromioclavicular joint.   PROCEDURES:  1. Left shoulder operative arthroscopy, with subacromial arch      decompression acromioplasty.  2. Mini open rotator cuff repair with three Carleene Mains PTON anchors.  3. Biceps tenotomy and SLAP debridement.  4. Ablation of mild glenohumeral joint.  5. Arthroscopic distal clavicle resection.   SURGEON:  1. Charlesetta Shanks, M.D.   ASSISTANT:  Phineas Semen, P.A.-C.   ANESTHESIA:  General, with LMA and scalene block.   CULTURES:  None.   DRAINS:  None.   ESTIMATED BLOOD LOSS:  Minimal.   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  She has  been seen in the office over the last 2 or 3 months.  She did have an  MRI scan her shoulder showing a small full-thickness partial tear of the  distalmost supraspinatus tendon.  She had a partial-thickness tear of  the long head of the biceps  and degenerative AC joint arthropathy and  SLAP lesion.  She had a marked acromion impingement.  She was treated by  Dr. Althea Charon, finally referred to me, and it was elected to proceed with  operative intervention.  At surgery, she did indeed have an unstable  slap tear superiorly, with a very tendonitic and fibrillated biceps  tendon.  We therefore tenotomized it and resected some of it, so it  would not protrude in the joint.  We did do an abrasion ablation on the  superior labrum.  This was a pain generator.  I then debrided lightly  the glenohumeral joint and used the ablator on 1 to smooth.  There was  synovitis.  The acromion was hooked and sharp.  The CA ligament was  released, and acromioplasty carried out to [facility] margins.  She had an  obviously arthritic AC joint, distal clavicle with cystic changes, and  was resected to [facility] margins.  We then did subdeltoid bursectomy,  viewed from the side, and found that she had a rotator cuff tear  avulsion distally, with adequate quality of tendon to repair, and used  the new  Tournier PTON anchors, which have a double metal hook loop that  does not required knots, pulling the suture down to the footprint with a  reverse mattress suture type stitch with FiberWire.  Three of these were  placed, with excellent to the side just past the tuberosity anchors put  in place.  These are nitinol, which is titanium and will not effect MRI  scan.  There were no knots, therefore.   PROCEDURE:  With adequate anesthesia obtained using endotracheal  technique, 1 g Ancef given for IV prophylaxis, and the patient was  placed in the supine beach-chair position.  The right shoulder was  prepped and draped in a standard fashion.  After standard prepping and  draping, skin markings were made for anatomic positioning.  I injected  the subacromial space with 20 mL of 0.5% Marcaine with epinephrine to  open it up.  I then entered the shoulder through a  posterior portal.  Anterior portal was established just lateral to the coracoid.  I then  debrided the SLAP lesion.  I then did a biceps tenotomy, letting it  retract, but it did not fully retract, so I further resected.  I then  used the ablator on 1 to smooth, reversed portals, and similar shaving  was carried out, with the ablator used on the glenohumeral surface  lightly.  I then entered the subacromial space through the posterior  portal.  Anterolateral portal was established.  I then shaved the soft  tissue undersurface of the anterior acromion and used the ablator to  cauterize.  A 6 bur was then brought in, and acromioplasty completed to  the roof of the subacromial space in the manner of Caspari.  I then  turned the scope medially sideways, where through an anterior portal I  debrided the distal clavicle 2 shaverbreadths and.  I then viewed the  shoulder through a lateral portal.  I further completed acromioplasty  back to the bicortical bone in the manner of Caspari, and shaved the  bursa.  I then evaluated the rotator cuff.  We then pulled the scope  out, painted with Betadine, and made an incision from the anterior  acromion edge to the anterolateral portal, dissected down through the  deltoid fascia in mini open fashion, and placed retractors.  I then  placed the 3 Tournier anchors with reverse mattress suture passer x3  with FiberWire, and then cinched up the repair back down to the prepared  footprint, with anchors just lateral to the tuberosity.  This completed  a watertight closure.  Irrigation was carried out.  The wound was then  closed in layers with interrupted figure-of-eight of #1 Vicryl on the  deltoid fascia, 2-0 Vicryl on the subcutaneous,  Monocryl closure, with  Steri-Strips.  We closed the portals with 4-0 nylon.  A bulky sterile  compressive dressing was applied with sling.  The patient, having  tolerated the procedure well, was awakened and taken to the  recovery  room in satisfactory condition,  to be discharged per outpatient  routine, given Percocet for pain, and told to call the office for  appointment for recheck tomorrow.           ______________________________  V. Charlesetta Shanks, M.D.     VEP/MEDQ  D:  01/11/2008  T:  01/11/2008  Job:  [id number]   cc:   Otho Darner, M.D.  Fax: [phone]   Sharin Grave, MD

## 2010-10-25 ENCOUNTER — Other Ambulatory Visit: Payer: Self-pay | Admitting: *Deleted

## 2010-10-25 DIAGNOSIS — E7800 Pure hypercholesterolemia, unspecified: Secondary | ICD-10-CM

## 2010-10-25 DIAGNOSIS — E785 Hyperlipidemia, unspecified: Secondary | ICD-10-CM

## 2010-10-25 DIAGNOSIS — E1139 Type 2 diabetes mellitus with other diabetic ophthalmic complication: Secondary | ICD-10-CM

## 2010-10-25 MED ORDER — SIMVASTATIN 40 MG PO TABS: 40.0000 mg | ORAL_TABLET | Freq: Every day | ORAL | Status: DC

## 2010-10-25 MED ORDER — METFORMIN HCL ER 750 MG PO TB24: 750.0000 mg | ORAL_TABLET | Freq: Every day | ORAL | Status: DC

## 2010-10-25 NOTE — Op Note (Signed)
NAME:  Gabriela Burke, HARKINS                    ACCOUNT NO.:  [id number]   MEDICAL RECORD NO.:  [id number]                   PATIENT TYPE:  AMB   LOCATION:  DSC                                  FACILITY:  [facility]   PHYSICIAN:  Gabriela Burke. Gabriela Burke, M.D.                DATE OF BIRTH:  12-18-1950   DATE OF PROCEDURE:  10/24/2003  DATE OF DISCHARGE:                                 OPERATIVE REPORT   PREOPERATIVE DIAGNOSIS:  Left carpal tunnel syndrome and left middle finger  trigger.   POSTOPERATIVE DIAGNOSIS:  Left carpal tunnel syndrome and left middle finger  trigger.   PROCEDURE:  Left carpal tunnel release and left middle finger trigger  release.   SURGEON:  Gabriela Burke. Gabriela Burke, M.D.   ANESTHESIA:  IV regional.   ESTIMATED BLOOD LOSS:  Minimal.   FLUID REPLACEMENT:  700 mL of crystalloid.   DRAINS PLACED:  None.   TOURNIQUET TIME:  27 minutes.   INDICATIONS FOR PROCEDURE:  This is a 60 year old woman with severe  bilateral carpal tunnel syndrome, a right trigger thumb and a left middle  finger trigger.  She desires elective catheter and trigger finger release to  decrease pain and increase function, well aware of the risks and benefits of  surgery.   DESCRIPTION OF PROCEDURE:  The patient was identified by the arm band and  taken to the operating room at [facility].  Appropriate  anesthetic monitors were attached and IV regional anesthesia induced into  the left hand and distal forearm using a forearm tourniquet.  The left hand  was prepped and draped in the usual sterile fashion from the fingertips to  the tourniquet.  We began the procedure by making a palmar midline incision  starting out at the wrist flexion crease just the ulnar side of the thenar  crease, cutting through the skin and subcutaneous tissues.  We went  distally.  We made a small incision in the palmar fascia of the distal  aspect of the wound which is about 3-4 cm in length and then the carpal  tunnel with the [facility] elevator keeping it the palmar side of the median  nerve.  We then cut down on the [facility] elevator, performing a catheter which  was extended into the forearm fascia with black handled scissors under  direct vision.  The wound was then irrigated out with normal saline solution  and small bleeders identified and cauterized with the bipolar.  We then  directed our attention to the left middle finger trigger and over the  palpable nodule onto the A1 pulley.  A transverse incision was made just to  the distal aspect of the flexor crease about 1.5 cm to 2 cm in length.  This  went through the skin.  We then used the tenotomy scissors to dissect down  to the A1 pulley keeping the neurovascular structures protected medially and  laterally.  Once we  identified the pulley a rectangular section was removed  with the 15 blade performing the trigger finger release.  We then took the  finger through a full range of motion and confirmed there was no further  triggering.  At this point, both wounds were once again irrigated out with  normal saline solution and the skin only was closed  with a running 5-0 nylon suture to both wounds.  A dressing of Xeroform, 4 x  4 dressings, sponges, Webril and an Ace wrap were applied.  The tourniquet  was let down.  The patient was then taken to the recovery room without  difficulty.                                               Gabriela Burke. Gabriela Burke, M.D.    Ovid Curd  D:  10/24/2003  T:  10/24/2003  Job:  [id number]

## 2010-10-25 NOTE — Op Note (Signed)
Gabriela Burke, Gabriela Burke          ACCOUNT NO.:  [id number]   MEDICAL RECORD NO.:  [id number]          PATIENT TYPE:  AMB   LOCATION:  ENDO                         FACILITY:  [facility]   PHYSICIAN:  Petra Kuba, M.D.    DATE OF BIRTH:  08/04/1950   DATE OF PROCEDURE:  10/14/2004  DATE OF DISCHARGE:                                 OPERATIVE REPORT   PROCEDURE:  Colonoscopy.   INDICATIONS FOR PROCEDURE:  Family history of colon polyps, due for colonic  screening, some bright red blood per rectum.  Consent was signed after  risks, benefits, methods and options were thoroughly discussed in the  office.   MEDICATIONS:  Demerol 75 and Versed 7.   PROCEDURE:  Rectal inspection pertinent for external hemorrhoids, small.  Digital exam was negative.  Video pediatric adjustable colonoscope was  inserted, easily advanced around the colon to the cecum.  No abdominal  pressure or position changes was needed.  No abnormality was seen on  insertion.  Cecum was identified by the appendiceal orifice and the  ileocecal valve.  In fact, the scope was inserted a short ways in the  terminal ileum which was normal.  Photo documentation was obtained.  The  scope was slowly withdrawn. Prep was adequate.  There was some liquid stool  that required washing and suctioning on slow withdrawal through the colon.  No abnormalities were seen but a tortuous sigmoid.  Specifically no polyps,  tumors, masses or diverticula.  Once back in the rectum, anorectal pull  through and retroflexion confirmed some small hemorrhoids.  Scope was  straightened and readvanced a short ways up the left side of the colon.  Air  was suctioned and scope removed.  Patient tolerated the procedure well and  there was no obvious immediate complication.   ENDOSCOPIC DIAGNOSES:  1.  Internal and external small hemorrhoids.  2.  Tortuous sigmoid.  3.  Otherwise normal to the terminal ileum.   PLAN:  Happy to see back p.r.n., otherwise  return care to the family  practice service for the customary healthcare maintenance to include yearly  rectals and guaiacs and repeat  colon screening in five years.      MEM/MEDQ  D:  10/14/2004  T:  10/14/2004  Job:  [id number]   cc:   Dr. Darrow Bussing [facility]

## 2010-11-07 ENCOUNTER — Inpatient Hospital Stay (INDEPENDENT_AMBULATORY_CARE_PROVIDER_SITE_OTHER)
Admission: RE | Admit: 2010-11-07 | Discharge: 2010-11-07 | Disposition: A | Discharge status: Home / Self Care | Payer: BC Managed Care – PPO | Source: Ambulatory Visit | Attending: Emergency Medicine | Admitting: Emergency Medicine

## 2010-11-07 DIAGNOSIS — J029 Acute pharyngitis, unspecified: Secondary | ICD-10-CM

## 2010-11-07 LAB — POCT RAPID STREP A: Streptococcus, Group A Screen (Direct): NEGATIVE

## 2010-12-09 ENCOUNTER — Encounter: Payer: Self-pay | Admitting: Family Medicine

## 2010-12-09 ENCOUNTER — Ambulatory Visit (INDEPENDENT_AMBULATORY_CARE_PROVIDER_SITE_OTHER): Payer: BC Managed Care – PPO | Admitting: Family Medicine

## 2010-12-09 VITALS — BP 150/80 | HR 72 | Wt 159.5 lb

## 2010-12-09 DIAGNOSIS — E1139 Type 2 diabetes mellitus with other diabetic ophthalmic complication: Secondary | ICD-10-CM

## 2010-12-09 DIAGNOSIS — R413 Other amnesia: Secondary | ICD-10-CM

## 2010-12-09 DIAGNOSIS — E7800 Pure hypercholesterolemia, unspecified: Secondary | ICD-10-CM

## 2010-12-09 DIAGNOSIS — J309 Allergic rhinitis, unspecified: Secondary | ICD-10-CM

## 2010-12-09 DIAGNOSIS — I10 Essential (primary) hypertension: Secondary | ICD-10-CM

## 2010-12-09 LAB — LDL CHOLESTEROL, DIRECT: Direct LDL: 98 mg/dL

## 2010-12-09 LAB — POCT GLYCOSYLATED HEMOGLOBIN (HGB A1C): Hemoglobin A1C: 6.6

## 2010-12-09 MED ORDER — SIMVASTATIN 40 MG PO TABS: 40.0000 mg | ORAL_TABLET | Freq: Every day | ORAL | Status: DC

## 2010-12-09 MED ORDER — LISINOPRIL 10 MG PO TABS: 10.0000 mg | ORAL_TABLET | Freq: Every day | ORAL | Status: DC

## 2010-12-09 MED ORDER — FLUTICASONE PROPIONATE 50 MCG/ACT NA SUSP: 2.0000 | Freq: Every day | NASAL | Status: DC

## 2010-12-09 MED ORDER — METFORMIN HCL ER 750 MG PO TB24: 750.0000 mg | ORAL_TABLET | Freq: Every day | ORAL | Status: DC

## 2010-12-09 MED ORDER — HYDROCHLOROTHIAZIDE 12.5 MG PO TABS
12.5000 mg | ORAL_TABLET | Freq: Every day | ORAL | Status: DC
Start: 2010-12-09 — End: 2011-12-15

## 2010-12-09 NOTE — Patient Instructions (Signed)
It was nice seeing you today.  Your blood pressure is somewhat elevated today.  I would like for you to come back in two weeks to have your blood pressure rechecked and we will test your memory at that time.  I have sent your refills to the [facility] on [address] and I will let you know the results of your labs when they return.  I you have questions feel free to call our office.  Take Care!

## 2010-12-10 LAB — COMPREHENSIVE METABOLIC PANEL
ALT: <8 U/L (ref 0–35)
AST: 18 U/L (ref 0–37)
Albumin: 4.3 g/dL (ref 3.5–5.2)
Alkaline Phosphatase: 98 U/L (ref 39–117)
BUN: 8 mg/dL (ref 6–23)
CO2: 29 mEq/L (ref 19–32)
Calcium: 9.7 mg/dL (ref 8.4–10.5)
Chloride: 103 mEq/L (ref 96–112)
Creat: 0.8 mg/dL (ref 0.50–1.10)
Glucose, Bld: 75 mg/dL (ref 70–99)
Potassium: 3.9 mEq/L (ref 3.5–5.3)
Sodium: 142 mEq/L (ref 135–145)
Total Bilirubin: 0.4 mg/dL (ref 0.3–1.2)
Total Protein: 7.2 g/dL (ref 6.0–8.3)

## 2010-12-10 LAB — CBC
HCT: 40.2 % (ref 36.0–46.0)
Hemoglobin: 12.6 g/dL (ref 12.0–15.0)
MCH: 27.8 pg (ref 26.0–34.0)
MCHC: 31.3 g/dL (ref 30.0–36.0)
MCV: 88.5 fL (ref 78.0–100.0)
Platelets: 245 10*3/uL (ref 150–400)
RBC: 4.54 MIL/uL (ref 3.87–5.11)
RDW: 15.1 % (ref 11.5–15.5)
WBC: 4.3 10*3/uL (ref 4.0–10.5)

## 2010-12-18 ENCOUNTER — Encounter: Payer: Self-pay | Admitting: Family Medicine

## 2010-12-18 DIAGNOSIS — F0390 Unspecified dementia without behavioral disturbance: Secondary | ICD-10-CM | POA: Insufficient documentation

## 2010-12-18 NOTE — Assessment & Plan Note (Signed)
Mainly short term memory loss.  Will do MMSE when she returns for BP recheck.  Could be related to increased stress.

## 2010-12-18 NOTE — Assessment & Plan Note (Signed)
A1C shows good control of diabetes, will not change medications.  Continue ACE-I.  Follow up in 6 months.

## 2010-12-18 NOTE — Progress Notes (Signed)
  Subjective:    Patient ID: Gabriela Burke, female    DOB: 1950-09-29, 60 y.o.   MRN: [id number]  HPI Patient here to follow up on  1.Diabetes:  Doing well with diabetes medication.  Does not check sugar that often.  Stays fairly active at work.  Eats a wide variety of foods, mostly healthy diet.   2. Hypertension:  Blood pressure has been doing ok, tolerating medications well.  Needs refills on meds.  Has been occasional headaches that she describes as tightness around her head.  Light does bother her at times.  Denies numbness or weakness.   Would like to discuss  1. Memory loss:  Has had increased memory loss over the past year.  States she often forgets where she puts stuff.  Denies forgetting how to get places when driving or problems with long term memory.  Has had increased stress because she looks after her brother who is handicapped.    Review of Systems Denies vision changes, nausea, vomiting, chest pain, shortness of breath    Objective:   Physical Exam  Constitutional: She is oriented to person, place, and time. She appears well-developed and well-nourished. No distress.  HENT:  Head: Normocephalic and atraumatic.  Eyes: Conjunctivae are normal. Pupils are equal, round, and reactive to light.  Cardiovascular: Normal rate, regular rhythm and normal heart sounds.   Pulmonary/Chest: Effort normal and breath sounds normal.  Musculoskeletal: She exhibits no edema.  Neurological: She is alert and oriented to person, place, and time.  Psychiatric: She has a normal mood and affect. Her behavior is normal. Judgment and thought content normal.          Assessment & Plan:

## 2010-12-18 NOTE — Assessment & Plan Note (Signed)
BP mildly elevated has been out of some of her meds, will refill today.  Check liver and kidney function and have her return in two weeks for recheck of BP on meds.   Red flags reviewed.

## 2010-12-25 ENCOUNTER — Ambulatory Visit (INDEPENDENT_AMBULATORY_CARE_PROVIDER_SITE_OTHER): Payer: BC Managed Care – PPO | Admitting: Family Medicine

## 2010-12-25 ENCOUNTER — Encounter: Payer: Self-pay | Admitting: Family Medicine

## 2010-12-25 DIAGNOSIS — I10 Essential (primary) hypertension: Secondary | ICD-10-CM

## 2010-12-25 DIAGNOSIS — R413 Other amnesia: Secondary | ICD-10-CM

## 2010-12-25 NOTE — Assessment & Plan Note (Signed)
Lab work from previous visit, WNL.  Blood pressure under much better control on her medications.  Red flags reviewed
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
## 2010-12-25 NOTE — Assessment & Plan Note (Addendum)
MMSE with difficulty with recall and serial 7's.  Seems to have moderate difficulty with short term memory.  I feel like this is likely stress related at this time but will keep in mind other considerations including alzheimer's or vascular dementia given her diabetes and hypertension.  Will continue to follow clinically, if worsening will repeat MMSE and refer to geriatric clinic

## 2011-03-07 LAB — DIFFERENTIAL
Basophils Absolute: 0
Basophils Relative: 1
Eosinophils Absolute: 0.1
Eosinophils Relative: 2
Lymphocytes Relative: 39
Lymphs Abs: 1.7
Monocytes Absolute: 0.3
Monocytes Relative: 8
Neutro Abs: 2.1
Neutrophils Relative %: 51

## 2011-03-07 LAB — BASIC METABOLIC PANEL
BUN: 7
CO2: 31
Calcium: 10
Chloride: 103
Creatinine, Ser: 0.88
GFR calc Af Amer: >60
GFR calc non Af Amer: >60
Glucose, Bld: 84
Potassium: 3.7
Sodium: 142

## 2011-03-07 LAB — CBC
HCT: 41
Hemoglobin: 13.5
MCHC: 32.8
MCV: 88
Platelets: 231
RBC: 4.66
RDW: 14.3
WBC: 4.2

## 2011-03-07 LAB — GLUCOSE, CAPILLARY
Glucose-Capillary: 124 — ABNORMAL HIGH
Glucose-Capillary: 90
Glucose-Capillary: 99

## 2011-03-07 LAB — URINALYSIS, ROUTINE W REFLEX MICROSCOPIC
Bilirubin Urine: NEGATIVE
Glucose, UA: NEGATIVE
Hgb urine dipstick: NEGATIVE
Ketones, ur: NEGATIVE
Nitrite: NEGATIVE
Protein, ur: NEGATIVE
Specific Gravity, Urine: 1.015
Urobilinogen, UA: 0.2
pH: 6.5

## 2011-03-07 LAB — URINE CULTURE
Colony Count: NO GROWTH
Culture: NO GROWTH
Special Requests: NEGATIVE

## 2011-03-07 LAB — APTT: aPTT: 28

## 2011-03-07 LAB — PROTIME-INR
INR: 1
Prothrombin Time: 12.9

## 2011-12-15 ENCOUNTER — Encounter (HOSPITAL_COMMUNITY): Payer: Self-pay | Admitting: *Deleted

## 2011-12-15 ENCOUNTER — Emergency Department (HOSPITAL_COMMUNITY)
Admission: EM | Admit: 2011-12-15 | Discharge: 2011-12-15 | Disposition: A | Discharge status: Home / Self Care | Payer: BC Managed Care – PPO | Attending: Emergency Medicine | Admitting: Emergency Medicine

## 2011-12-15 DIAGNOSIS — S335XXA Sprain of ligaments of lumbar spine, initial encounter: Secondary | ICD-10-CM | POA: Insufficient documentation

## 2011-12-15 DIAGNOSIS — Y92410 Unspecified street and highway as the place of occurrence of the external cause: Secondary | ICD-10-CM | POA: Insufficient documentation

## 2011-12-15 DIAGNOSIS — I10 Essential (primary) hypertension: Secondary | ICD-10-CM | POA: Insufficient documentation

## 2011-12-15 DIAGNOSIS — Z7982 Long term (current) use of aspirin: Secondary | ICD-10-CM | POA: Insufficient documentation

## 2011-12-15 DIAGNOSIS — M546 Pain in thoracic spine: Secondary | ICD-10-CM | POA: Insufficient documentation

## 2011-12-15 DIAGNOSIS — M545 Low back pain, unspecified: Secondary | ICD-10-CM | POA: Insufficient documentation

## 2011-12-15 DIAGNOSIS — S239XXA Sprain of unspecified parts of thorax, initial encounter: Secondary | ICD-10-CM | POA: Insufficient documentation

## 2011-12-15 DIAGNOSIS — Z91040 Latex allergy status: Secondary | ICD-10-CM | POA: Insufficient documentation

## 2011-12-15 DIAGNOSIS — E785 Hyperlipidemia, unspecified: Secondary | ICD-10-CM | POA: Insufficient documentation

## 2011-12-15 DIAGNOSIS — E119 Type 2 diabetes mellitus without complications: Secondary | ICD-10-CM | POA: Insufficient documentation

## 2011-12-15 DIAGNOSIS — Z90710 Acquired absence of both cervix and uterus: Secondary | ICD-10-CM | POA: Insufficient documentation

## 2011-12-15 MED ORDER — METAXALONE 400 MG HALF TABLET: 400.0000 mg | ORAL_TABLET | Freq: Three times a day (TID) | ORAL | Status: DC

## 2011-12-15 MED ORDER — METAXALONE 400 MG HALF TABLET
400.0000 mg | ORAL_TABLET | Freq: Once | ORAL | Status: AC
  Administered 2011-12-15: 400 mg via ORAL
  Filled 2011-12-15: qty 1

## 2011-12-15 MED ORDER — TRAMADOL HCL 50 MG PO TABS
50.0000 mg | ORAL_TABLET | Freq: Once | ORAL | Status: AC
  Administered 2011-12-15: 50 mg via ORAL
  Filled 2011-12-15: qty 1

## 2011-12-15 MED ORDER — TRAMADOL HCL 50 MG PO TABS: 50.0000 mg | ORAL_TABLET | Freq: Four times a day (QID) | ORAL | Status: DC | PRN

## 2011-12-15 NOTE — ED Notes (Signed)
PT ambulated with a  Steady gait; VSS; A&Ox3; no signs of distress; respiraitons even and unlabored; skin warm and dry. No questions at this time.
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
## 2011-12-15 NOTE — ED Provider Notes (Signed)
Medical screening examination/treatment/procedure(s) were performed by non-physician practitioner and as supervising physician I was immediately available for consultation/collaboration.  Dioselina Brumbaugh K Linker, MD 12/15/11 2221 

## 2011-12-25 ENCOUNTER — Telehealth: Payer: Self-pay | Admitting: Family Medicine

## 2011-12-25 NOTE — Telephone Encounter (Signed)
Returned call to patient.  Offered patient work-in appt for this afternoon on [facility] overflow clinic.  Patient declined and scheduled appt for tomorrow morning with crosscover clinic.  Gaylene Brooks, RN

## 2011-12-25 NOTE — Telephone Encounter (Signed)
Twisted back after she stepped wrong yesterday and wants to come in today

## 2011-12-26 ENCOUNTER — Ambulatory Visit (INDEPENDENT_AMBULATORY_CARE_PROVIDER_SITE_OTHER): Payer: BC Managed Care – PPO | Admitting: Family Medicine

## 2011-12-26 VITALS — BP 128/82 | HR 90 | Temp 98.1°F | Ht 64.5 in | Wt 159.0 lb

## 2011-12-26 DIAGNOSIS — M545 Low back pain, unspecified: Secondary | ICD-10-CM

## 2011-12-26 MED ORDER — METFORMIN HCL ER 750 MG PO TB24: 750.0000 mg | ORAL_TABLET | Freq: Every day | ORAL | Status: DC

## 2011-12-26 MED ORDER — ACETAMINOPHEN 500 MG PO TABS: 1000.0000 mg | ORAL_TABLET | Freq: Four times a day (QID) | ORAL | Status: DC | PRN

## 2011-12-26 MED ORDER — CYCLOBENZAPRINE HCL 10 MG PO TABS: 10.0000 mg | ORAL_TABLET | Freq: Two times a day (BID) | ORAL | Status: AC | PRN

## 2011-12-26 MED ORDER — SIMVASTATIN 40 MG PO TABS: 40.0000 mg | ORAL_TABLET | Freq: Every day | ORAL | Status: DC

## 2011-12-26 MED ORDER — IBUPROFEN 200 MG PO TABS: 600.0000 mg | ORAL_TABLET | Freq: Four times a day (QID) | ORAL | Status: AC | PRN

## 2011-12-26 MED ORDER — LISINOPRIL 10 MG PO TABS: 10.0000 mg | ORAL_TABLET | Freq: Every day | ORAL | Status: DC
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
## 2011-12-26 NOTE — Patient Instructions (Addendum)
Gabriela Burke,  Thank you for coming in today. Your back pain is muscular strain. For this please do the following: 1. Get tylenol 500 mg OTC: take 2 tabs every 6 hours as needed for pain. 2. Get ibuprofen 200 mg OTC: take 3 tabs every 6 hours as needed for pain.  3. Pick up flexeril prescription this is a muscle relaxer: take one tab twice daily as needed for pain.  4. Use heating pad x 20 minutes every 3-4 hrs as needed.   Call and come in if pain is not at least 50% better in 4 days. You develop weakness, incontinence of stool or urine.   Dr. Armen Pickup   Back Exercises Back exercises help treat and prevent back injuries. The goal is to increase your strength in your belly (abdominal) and back muscles. These exercises can also help with flexibility. Start these exercises when told by your doctor. HOME CARE Back exercises include: Pelvic Tilt.  Lie on your back with your knees bent. Tilt your pelvis until the lower part of your back is against the floor. Hold this position 5 to 10 sec. Repeat this exercise 5 to 10 times.  Knee to Chest.  Pull 1 knee up against your chest and hold for 20 to 30 seconds. Repeat this with the other knee. This may be done with the other leg straight or bent, whichever feels better. Then, pull both knees up against your chest.  Sit-Ups or Curl-Ups.  Bend your knees 90 degrees. Start with tilting your pelvis, and do a partial, slow sit-up. Only lift your upper half 30 to 45 degrees off the floor. Take at least 2 to 3 seonds for each sit-up. Do not do sit-ups with your knees out straight. If partial sit-ups are difficult, simply do the above but with only tightening your belly (abdominal) muscles and holding it as told.  Hip-Lift.  Lie on your back with your knees flexed 90 degrees. Push down with your feet and shoulders as you raise your hips 2 inches off the floor. Hold for 10 seconds, repeat 5 to 10 times.  Back Arches.  Lie on your stomach. Prop yourself up  on bent elbows. Slowly press on your hands, causing an arch in your low back. Repeat 3 to 5 times.  Shoulder-Lifts.  Lie face down with arms beside your body. Keep hips and belly pressed to floor as you slowly lift your head and shoulders off the floor.  Do not overdo your exercises. Be careful in the beginning. Exercises may cause you some mild back discomfort. If the pain lasts for more than 15 minutes, stop the exercises until you see your doctor. Improvement with exercise for back problems is slow.  Document Released: 06/28/2010 Document Revised: 05/15/2011 Document Reviewed: 06/28/2010 [facility] Patient Information 2012 [place], [place].

## 2011-12-26 NOTE — Assessment & Plan Note (Addendum)
A: acute low back pain without radicular signs. History and exam consistent with MSK pain. No evidence of malignancy or nerve impingement.  P:  -Conservative therapy and f/u per AVS 1. Get tylenol 500 mg OTC: take 2 tabs every 6 hours as needed for pain. 2. Get ibuprofen 200 mg OTC: take 3 tabs every 6 hours as needed for pain.  3. Pick up flexeril prescription this is a muscle relaxer: take one tab twice daily as needed for pain.  4. Use heating pad x 20 minutes every 3-4 hrs as needed.   Call and come in if pain is not at least 50% better in 4 days. You develop weakness, incontinence of stool or urine.

## 2012-01-05 ENCOUNTER — Other Ambulatory Visit: Payer: Self-pay | Admitting: Family Medicine

## 2012-01-05 DIAGNOSIS — I10 Essential (primary) hypertension: Secondary | ICD-10-CM

## 2012-01-05 MED ORDER — HYDROCHLOROTHIAZIDE 12.5 MG PO TABS: 12.5000 mg | ORAL_TABLET | Freq: Every day | ORAL | Status: DC

## 2012-01-20 ENCOUNTER — Telehealth: Payer: Self-pay | Admitting: Family Medicine

## 2012-01-20 NOTE — Telephone Encounter (Signed)
Fwd. To Dr.Matthews for review. .Dade Rodin  

## 2012-01-20 NOTE — Telephone Encounter (Signed)
Sister of patient is concerned that her sister won't discuss the real problems.

## 2012-01-20 NOTE — Telephone Encounter (Signed)
Sister of patient is calling because she is concerned that her sister won't discuss her short term memory problems with the MD.  She is scheduled to come in tomorrow and because the sister is off of work this week, she is really pushing to get the necessary referrals and appt this week.  She would like Dr. Ashley Royalty to call her back.

## 2012-01-21 ENCOUNTER — Encounter: Payer: Self-pay | Admitting: Family Medicine

## 2012-01-21 ENCOUNTER — Ambulatory Visit (INDEPENDENT_AMBULATORY_CARE_PROVIDER_SITE_OTHER): Payer: BC Managed Care – PPO | Admitting: Family Medicine

## 2012-01-21 VITALS — BP 139/81 | HR 80 | Ht 64.5 in | Wt 155.0 lb

## 2012-01-21 DIAGNOSIS — R413 Other amnesia: Secondary | ICD-10-CM

## 2012-01-21 DIAGNOSIS — H579 Unspecified disorder of eye and adnexa: Secondary | ICD-10-CM

## 2012-01-21 DIAGNOSIS — E1139 Type 2 diabetes mellitus with other diabetic ophthalmic complication: Secondary | ICD-10-CM

## 2012-01-21 LAB — POCT GLYCOSYLATED HEMOGLOBIN (HGB A1C): Hemoglobin A1C: 6.3

## 2012-01-21 MED ORDER — FLUOXETINE HCL 20 MG PO TABS: 20.0000 mg | ORAL_TABLET | Freq: Every day | ORAL | Status: DC

## 2012-01-21 NOTE — Patient Instructions (Signed)
Thank you for coming in today, it was good to see you I would like for you to give the prozac a try to see if this helps with things I will see you back in two weeks to see how you are doing.

## 2012-01-22 LAB — COMPREHENSIVE METABOLIC PANEL
ALT: 9 U/L (ref 0–35)
AST: 22 U/L (ref 0–37)
Albumin: 4.2 g/dL (ref 3.5–5.2)
Alkaline Phosphatase: 111 U/L (ref 39–117)
BUN: 8 mg/dL (ref 6–23)
CO2: 31 mEq/L (ref 19–32)
Calcium: 9.3 mg/dL (ref 8.4–10.5)
Chloride: 106 mEq/L (ref 96–112)
Creat: 0.77 mg/dL (ref 0.50–1.10)
Glucose, Bld: 91 mg/dL (ref 70–99)
Potassium: 3.7 mEq/L (ref 3.5–5.3)
Sodium: 143 mEq/L (ref 135–145)
Total Bilirubin: 0.7 mg/dL (ref 0.3–1.2)
Total Protein: 7.3 g/dL (ref 6.0–8.3)

## 2012-01-22 LAB — LDL CHOLESTEROL, DIRECT: Direct LDL: 112 mg/dL — ABNORMAL HIGH

## 2012-01-27 ENCOUNTER — Encounter: Payer: Self-pay | Admitting: Family Medicine

## 2012-01-27 NOTE — Assessment & Plan Note (Signed)
A1c 6.3, good control of diabetes.  COntinue current medications.  Check d-ldl, Cmet

## 2012-01-27 NOTE — Assessment & Plan Note (Signed)
Still with complaint of memory issues. MMSE stable, with most points lost in recall.  She is under quite a bit of stress with positive screening for depression.  I discussed with her giving an antidepressant a try to see if this may help improve all of her symptoms, including memory/cognition

## 2012-01-27 NOTE — Progress Notes (Signed)
  Subjective:    Patient ID: Gabriela Burke, female    DOB: [birth year], 61 y.o.   MRN: [id number]  HPI  1.  Memory issues:  Here today with sister to discuss memory issues.  Sister feels like memory issues are getting worse.  Patient agrees she has some memory issues, however she is not sure that they have worsened.  Memory issues are short term, such as remembering where keys are.  She states she is not misplacing paychecks anymore, denies getting lost or leaving appliances on.  She does endorse continued high stress levels at home.  Her typical day is: Wake up at 3am to help take care of brother Works until [employer], comes home and cares for brother. Goes to bed typically around 7-7:30.  She states that she sleeps well.  However she does endorse anhedonia, decreased appetite, and lack of energy.  2.  Diabetes:  Glucose well controlled at home.  She is compliant with her medications.  She is taking ACE-I.  Reports no problems with current medications.   Review of Systems Per HPI    Objective:   Physical Exam  Constitutional: She is oriented to person, place, and time. She appears well-nourished. No distress.  HENT:  Head: Normocephalic and atraumatic.  Neck: Neck supple.  Cardiovascular: Normal rate and regular rhythm.   Pulmonary/Chest: Effort normal and breath sounds normal.  Neurological: She is alert and oriented to person, place, and time.  Psychiatric:       Seems very irritable, with some mild psychomotor slowing.  Her thought content seems normal.     Mini Mental State Exam  Orientation (one point each, total 10 points)  1. What is the year? 1  2. What is the season? 1  3. What is the month? 1  4. What day of the week is it today? 1  5. What is today's date? 1  6. What country are we in now? 1  7. What state are we in now? 1  8. What city/town are we in now? 1  9. What is the name of building/place we are in now? 1  10. What ward/floor/room are we in now? 1    Registration (One point for each object, total 3 points)  Able to repeat three objects? 3  Attention and Calculation (Total 5 points)  Serial 7's [id number]) or Spell World Backwards (Subtract one point for each wrong number or letter) 3  Recall (One point for each object, total 3 points)  Able to recall three objects from registration 1 Language (One point for each, total 8 points)  1. Can name pen or pencil 1  2. Can name watch 1  3. Can repeat the phrase "No ifs, ands or buts" 1  4. Follows verbal directions of "Takes paper in right hand, folds paper in half, places paper on floor" 1  5. Can read and follow written direction of "Close Your Eyes" 1  6. Able to write a sentence containing a subject and a verb and make sense. 1  Visuospatial orientation (One point if both objects have 5 sides and intersect, total 1 point)  Can copy 5 sided figures 1  Total 26 (25 points or more=normal score)         Assessment & Plan:

## 2012-01-28 ENCOUNTER — Encounter: Payer: Self-pay | Admitting: Medical

## 2012-01-28 ENCOUNTER — Ambulatory Visit (INDEPENDENT_AMBULATORY_CARE_PROVIDER_SITE_OTHER): Payer: BC Managed Care – PPO | Admitting: Medical

## 2012-01-28 VITALS — BP 110/70 | HR 76 | Temp 98.0°F | Resp 16 | Ht 64.0 in | Wt 153.0 lb

## 2012-01-28 DIAGNOSIS — R109 Unspecified abdominal pain: Secondary | ICD-10-CM

## 2012-01-28 DIAGNOSIS — F438 Other reactions to severe stress: Secondary | ICD-10-CM

## 2012-01-28 DIAGNOSIS — R300 Dysuria: Secondary | ICD-10-CM

## 2012-01-28 DIAGNOSIS — R413 Other amnesia: Secondary | ICD-10-CM

## 2012-01-28 DIAGNOSIS — F430 Acute stress reaction: Secondary | ICD-10-CM

## 2012-01-28 LAB — POCT URINALYSIS DIPSTICK
Bilirubin, UA: NEGATIVE
Blood, UA: NEGATIVE
Glucose, UA: NEGATIVE
Ketones, UA: NEGATIVE
Leukocytes, UA: NEGATIVE
Nitrite, UA: NEGATIVE
Protein, UA: NEGATIVE
Spec Grav, UA: 1.01
Urobilinogen, UA: NEGATIVE
pH, UA: 5

## 2012-01-28 NOTE — Progress Notes (Signed)
Subjective:   HPI  Gabriela Burke is a 61 y.o. female who presents as a new patient.  Her sister is here with her today who urged her to come in due to belly pain, possible medication interactions.   She normally sees Dr. Ashley Royalty her PCM.  Her sister brought her in here today.     Gabriela Burke notes that she lives wither her brother.  She looks after him.  He is in his 61s, schizophrenic, but takes care of most of his ADLs.  She notes that she works full time as a Advertising copywriter for [employer], but otherwise stays at home most of the time. She notes that she would like to have a break every now and then where her sister watches the brother for a few hours on a given day to give Gabriela Burke a break.  She normally sees Dr. Ashley Royalty for routine care, chronic disease f/u.  Her medications have all been the same for years now, except Prozac which Dr. Ashley Royalty added last visit few weeks ago.  She mentioned some abdominal discomfort, and her sister recommended she come here for opinion on whether her medications were causing her pain.  From the start of the conversation, there seems to be tension and frustration between Gabriela Burke and her sister.  Gabriela Burke says that her sister tries to interrupt when she is talking.  She says her sister is being pushy and overbearing, not letting Gabriela Burke be independent in her own home.  She mentioned concerns about memory loss, mixing up her medications in her weekly dispensing container, possible hoarding behavior, and letting a fig tree outside her home over grow the door.  Gabriela Burke says she works full time and takes care of her brother and doesn't have adequate time to get to everything but says she can handle her medication dispensing and organization, cleans her house just like she cleans at work, and denies hoarding.  She did agree for other family to take care of the fig tree issue.    No other aggravating or relieving factors.    No other c/o.  The  following portions of the patient's history were reviewed and updated as appropriate: allergies, current medications, past family history, past medical history, past social history, past surgical history and problem list.  Past Medical History  Diagnosis Date  . HTN (hypertension)   . HLD (hyperlipidemia)   . DM (diabetes mellitus)     Allergies  Allergen Reactions  . Latex    Review of Systems ROS reviewed and was negative other than noted in HPI or above.    Objective:   Physical Exam  General appearance: alert, no distress, WD/WN, AA female Psych: agitated at her sister, but pleasant to me, answers questions appropriately, A&Ox3 Neck: supple, no lymphadenopathy, no thyromegaly, no masses Heart: RRR, normal S1, S2, no murmurs Lungs: CTA bilaterally, no wheezes, rhonchi, or rales Abdomen: +bs, soft, non tender, non distended, no masses, no hepatomegaly, no splenomegaly Pulses: 2+ symmetric, upper and lower extremities, normal cap refill   Assessment and Plan :     Encounter Diagnoses  Name Primary?  . Memory disturbance Yes  . Acute stress reaction   . Dysuria   . Abdominal pain    Plan: UA unremarkable, no obvious UTI.    Today's visit was somewhat atypical.  I ended up trying to mediate the argument between the 2 sisters and reach a compromise.   Ms. Batta is frustrated that her sister  is disregarding her independence, is treating her symptoms as if they are way worse than they appear, not giving her the benefit of the doubt, and not letting her run her household.  I did help Gabriela Burke understand and appreciate that her sister is trying to help and is concerned for Ms. [place], but we discussed a fine line between caring and compassionate vs imposing and overstepping one's bounds.  I recommended Gabriela Burke go back and see her primary care provider about her concerns over Metformin and possible GI side effects that seems to be interfering with her compliance,  discuss her memory concerns, and consider neurology evaluation.  Her sister will start keeping their brother for a few hours on saturdays to help Gabriela Burke clean her house and have a minute to relax and have some independent time at home.  otherwise sister will try and refrain from being too involved in the decisions on how Gabriela Burke runs her household.  Recheck with [place] or her PCM in 2wk.

## 2012-01-30 ENCOUNTER — Telehealth: Payer: Self-pay | Admitting: Family Medicine

## 2012-01-30 NOTE — Telephone Encounter (Signed)
Per Kristian Covey PA-C I fax a copy of the patients last OV note to Dr. Anastasio Auerbach. CLS

## 2012-01-30 NOTE — Telephone Encounter (Signed)
Message copied by Janeice Robinson on Fri Jan 30, 2012  8:27 AM ------      Message from: [place], [place]      Created: Fri Jan 30, 2012  8:07 AM       pls send copy of my OV note to her primary care provider, Dr.Matthews

## 2012-02-04 ENCOUNTER — Ambulatory Visit: Payer: BC Managed Care – PPO | Admitting: Family Medicine

## 2012-02-24 ENCOUNTER — Other Ambulatory Visit: Payer: Self-pay | Admitting: Family Medicine

## 2012-07-02 ENCOUNTER — Other Ambulatory Visit: Payer: Self-pay | Admitting: Family Medicine

## 2012-07-02 MED ORDER — FLUOXETINE HCL 20 MG PO TABS: 20.0000 mg | ORAL_TABLET | Freq: Every day | ORAL | Status: DC

## 2012-07-07 ENCOUNTER — Ambulatory Visit: Payer: BC Managed Care – PPO | Admitting: Family Medicine

## 2012-07-12 ENCOUNTER — Encounter: Payer: Self-pay | Admitting: Family Medicine

## 2012-07-12 ENCOUNTER — Ambulatory Visit (INDEPENDENT_AMBULATORY_CARE_PROVIDER_SITE_OTHER): Payer: BC Managed Care – PPO | Admitting: Family Medicine

## 2012-07-12 VITALS — BP 146/77 | HR 62 | Ht 64.0 in | Wt 146.0 lb

## 2012-07-12 DIAGNOSIS — R413 Other amnesia: Secondary | ICD-10-CM

## 2012-07-12 LAB — VITAMIN B12: Vitamin B-12: 500 pg/mL (ref 211–911)

## 2012-07-12 LAB — TSH: TSH: 1.967 u[IU]/mL (ref 0.350–4.500)

## 2012-07-12 NOTE — Patient Instructions (Signed)
Thank you for coming in today, it was good to see you I am concerned about your memory issues.  I am going to refer you to a neurologist for further work up. I would like to check some lab work on you as well. I will see you back in one month.

## 2012-07-13 LAB — RPR

## 2012-07-15 NOTE — Progress Notes (Signed)
  Subjective:    Patient ID: Gabriela Burke, female    DOB: 05/08/1951, 62 y.o.   MRN: [id number]  HPI  1. Memory loss:  Patient here with her sister because they are concerned about worsening memory loss.  First brought to my attention in July 2012 but had noted slight improvement in memory at visit in August 2013.  Now worsening again to the point where she is having difficulty managing finances and getting home at times.  She has had more occurences of driving somewhere and being unable to find her way back home.  She will get her checks cash and forget what she does with the money and has accused her siblings from stealing from her.  Her longterm memory has been intact.  Her sister is very concerned because she sometimes forgets to pay her bills and she is worried about her losing her home.  Denies and tremor, difficulty with gait, urinary or stool incontinence  Review of Systems Per HPI    Objective:   Physical Exam  Constitutional: She appears well-developed and well-nourished. No distress.  HENT:  Head: Normocephalic and atraumatic.  Eyes: Conjunctivae normal are normal.  Neurological: She is alert. No cranial nerve deficit. She exhibits normal muscle tone. Coordination normal.       Oriented to person and place only    Mini Mental State Exam  Orientation (one point each, total 10 points)  1. What is the year? 1  2. What is the season? 1  3. What is the month? 1  4. What day of the week is it today? 0  5. What is today's date? 0  6. What country are we in now? 1  7. What state are we in now? 1  8. What city/town are we in now? 1  9. What is the name of building/place we are in now? 1  10. What ward/floor/room are we in now? 1  Registration (One point for each object, total 3 points)  Able to repeat three objects? 3  Attention and Calculation (Total 5 points)  Serial 7's [id number]) or Spell World Backwards (Subtract one point for each wrong number or letter)  1 Recall (One point for each object, total 3 points)  Able to recall three objects from registration 0  Language (One point for each, total 8 points)  1. Can name pen or pencil 1  2. Can name watch 1  3. Can repeat the phrase "No ifs, ands or buts" 1  4. Follows verbal directions of "Takes paper in right hand, folds paper in half, places paper on floor" 1  5. Can read and follow written direction of "Close Your Eyes" 1  6. Able to write a sentence containing a subject and a verb and make sense. 1  Visuospatial orientation (One point if both objects have 5 sides and intersect, total 1 point)  Can copy 5 sided figures 1  Total 19 (25 points or more=normal score)         Assessment & Plan:

## 2012-07-15 NOTE — Assessment & Plan Note (Addendum)
Worsening memory loss with MMSE 26-->19.  Prozac has not made much of a difference.  Will check for reversible causes of dementia and refer to neurology, given her age and fairly rapid progression.  Discussed starting aricept but she wants to hold off for now.

## 2012-08-11 ENCOUNTER — Encounter: Payer: Self-pay | Admitting: Family Medicine

## 2012-08-11 ENCOUNTER — Ambulatory Visit (INDEPENDENT_AMBULATORY_CARE_PROVIDER_SITE_OTHER): Payer: BC Managed Care – PPO | Admitting: Family Medicine

## 2012-08-11 VITALS — BP 154/78 | HR 74 | Ht 64.0 in | Wt 146.0 lb

## 2012-08-11 DIAGNOSIS — R413 Other amnesia: Secondary | ICD-10-CM

## 2012-08-11 DIAGNOSIS — E1139 Type 2 diabetes mellitus with other diabetic ophthalmic complication: Secondary | ICD-10-CM

## 2012-08-11 MED ORDER — FLUOXETINE HCL 20 MG PO TABS: 40.0000 mg | ORAL_TABLET | Freq: Every day | ORAL | Status: DC

## 2012-08-11 NOTE — Patient Instructions (Signed)
Thank you for coming in today, it was good to see you I would like for you to increase your fluoxetine to 40mg . I will place a referral to neurology for you. Please return to see me in 2-3 weeks.

## 2012-08-15 NOTE — Assessment & Plan Note (Signed)
Reports memory loss is stable to improved and feels like it is worse with stress.  Still with some sypmtoms of depression on Phq-9, will increase prozac.  Lab workup for dementia negative.  Will place referral to neuro as I think other causes still need to be ruled out.
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
## 2012-08-25 ENCOUNTER — Ambulatory Visit: Payer: Self-pay | Admitting: Neurology

## 2012-09-01 ENCOUNTER — Ambulatory Visit: Payer: BC Managed Care – PPO | Admitting: Family Medicine

## 2012-09-27 ENCOUNTER — Encounter: Payer: Self-pay | Admitting: Home Health Services

## 2012-09-27 NOTE — Progress Notes (Signed)
Spoke with Gabriela Burke.  Informed patient that results from retinal scan 07-12-12 came back negative for diabetic retinopathy.  Informed patient of 12 month follow up retinal scan.  Pt understood.

## 2012-09-28 ENCOUNTER — Encounter: Payer: Self-pay | Admitting: Family Medicine

## 2012-09-28 ENCOUNTER — Ambulatory Visit (INDEPENDENT_AMBULATORY_CARE_PROVIDER_SITE_OTHER): Payer: BC Managed Care – PPO | Admitting: Family Medicine

## 2012-09-28 VITALS — BP 131/82 | HR 71 | Ht 64.0 in | Wt 147.0 lb

## 2012-09-28 DIAGNOSIS — E1139 Type 2 diabetes mellitus with other diabetic ophthalmic complication: Secondary | ICD-10-CM

## 2012-09-28 DIAGNOSIS — R413 Other amnesia: Secondary | ICD-10-CM

## 2012-09-28 LAB — POCT GLYCOSYLATED HEMOGLOBIN (HGB A1C): Hemoglobin A1C: 6.2

## 2012-09-28 NOTE — Assessment & Plan Note (Signed)
Well controlled, continue metformin.  F/u 3 months.

## 2012-09-28 NOTE — Progress Notes (Signed)
  Subjective:    Patient ID: Gabriela Burke, female    DOB: [birth year], 62 y.o.   MRN: [id number]  HPI 1. DM: CHRONIC DIABETES  Disease Monitoring  Blood Sugar Ranges: 120-130 fasting  Polyuria: no   Visual problems: no   Medication Compliance: yes  Medication Side Effects  Hypoglycemia: no   Preventitive Health Care  Eye Exam: Plans to have done  Diet pattern: Generally healthy, typically cooks meals from scratch.  NO added salt, low carb, avoids fried foods/ fast food  Exercise: Takes stairs at work daily, walking occasionally.   2. Memory issues:  Memory issues continue but she only notices them when she is stressed out around her sister.  She feels like her sister is constantly "fussing" at her about something and criticizing her.  She has no issues when she is at work or at home alone.  She is able to shop for herself and prepares meals for her and her brother without difficulty. She had referral to neurology but does not recall them calling to make and appointment.    Review of Systems Per HPI    Objective:   Physical Exam  Constitutional: She is oriented to person, place, and time. She appears well-nourished. No distress.  HENT:  Head: Normocephalic and atraumatic.  Cardiovascular: Normal rate and regular rhythm.   Pulmonary/Chest: Effort normal and breath sounds normal.  Musculoskeletal: She exhibits no edema.  Neurological: She is alert and oriented to person, place, and time. No cranial nerve deficit. Coordination and gait normal.          Assessment & Plan:

## 2012-09-28 NOTE — Assessment & Plan Note (Signed)
Stable at this time.  Checked on referral to neuro, had appointment scheduled on 3/19, she thinks her sister probably answered the phone and made the appointment.  Given number to call back and reschedule appt.  Declines starting aricept.  Stress seems to play a significant role in her functioning.  If she is not around her sister seems to be high functioning.  She can complete all IADL's and ADL's.  She is able to complete crossword puzzles in book she has with her today without any difficulty

## 2012-09-28 NOTE — Patient Instructions (Addendum)
Thank you for coming in today, it was good to see you Your diabetes is well controlled Call the neurology office to reschedule an appointment Follow up with me after you have seen the neurologist.

## 2012-11-18 ENCOUNTER — Ambulatory Visit: Payer: BC Managed Care – PPO

## 2012-12-16 ENCOUNTER — Other Ambulatory Visit: Payer: Self-pay

## 2013-01-04 ENCOUNTER — Ambulatory Visit (INDEPENDENT_AMBULATORY_CARE_PROVIDER_SITE_OTHER): Payer: BC Managed Care – PPO | Admitting: Family Medicine

## 2013-01-04 ENCOUNTER — Encounter: Payer: Self-pay | Admitting: Family Medicine

## 2013-01-04 VITALS — BP 158/83 | HR 80 | Temp 98.0°F | Wt 152.0 lb

## 2013-01-04 DIAGNOSIS — E7800 Pure hypercholesterolemia, unspecified: Secondary | ICD-10-CM

## 2013-01-04 DIAGNOSIS — I10 Essential (primary) hypertension: Secondary | ICD-10-CM

## 2013-01-04 DIAGNOSIS — R413 Other amnesia: Secondary | ICD-10-CM

## 2013-01-04 DIAGNOSIS — F320 Major depressive disorder, single episode, mild: Secondary | ICD-10-CM

## 2013-01-04 MED ORDER — LISINOPRIL 10 MG PO TABS: 10.0000 mg | ORAL_TABLET | Freq: Every day | ORAL | Status: DC

## 2013-01-04 MED ORDER — FLUOXETINE HCL 20 MG PO TABS: 20.0000 mg | ORAL_TABLET | Freq: Every day | ORAL | Status: DC

## 2013-01-04 MED ORDER — SIMVASTATIN 40 MG PO TABS: 40.0000 mg | ORAL_TABLET | Freq: Every day | ORAL | Status: DC

## 2013-01-04 MED ORDER — HYDROCHLOROTHIAZIDE 12.5 MG PO TABS: 12.5000 mg | ORAL_TABLET | Freq: Every day | ORAL | Status: DC

## 2013-01-04 MED ORDER — METFORMIN HCL ER 750 MG PO TB24: 750.0000 mg | ORAL_TABLET | Freq: Every day | ORAL | Status: DC

## 2013-01-04 NOTE — Assessment & Plan Note (Signed)
I refilled Zocor 40 mg qd.

## 2013-01-04 NOTE — Patient Instructions (Addendum)
Alzheimer's Disease Alzheimer's disease is a breaking down of the brain that sometimes happens with aging. It often affects memory, thinking, and speaking. It also affects how you get along with people and job performance. Often the changes come on slowly and are not very noticeable. The changes may be silent and hidden for a long time, even from family and friends. Mood and personality changes often cause the family to seek medical care. CAUSES  Alzheimer's disease is the leading cause of dementia. Dementia is a reduced ability of the working of the brain. Alzheimer's is one type of dementia and is caused by small changes in the brain. There are many causes of Alzheimer's disease. DIAGNOSIS  There are no specific tests for Alzheimer's disease, other than doing a biopsy(tissue sample) of the brain. This is not usually done. Physical examination and history often provide the best clues for a diagnosis. Often the diagnosis may be made over time, with more observation.  TREATMENT  There is no specific treatment for Alzheimer's disease. Your caregivers will discuss the particular problems you are dealing with or may deal with. They can help direct you to other caregivers who can help in the care of Alzheimer's patients.  Document Released: 03/05/2005 Document Revised: 08/18/2011 Document Reviewed: 10/26/2006 [employer] Patient Information 2014 [employer].  

## 2013-01-04 NOTE — Assessment & Plan Note (Signed)
Currently not suicidal, not a danger to self or others. I restarted her on Prozac at lower dose of 20 mg daily. She is instructed to follow up with her PCP in 2-4 wks for reassessment.

## 2013-01-04 NOTE — Assessment & Plan Note (Signed)
MMSE score of 20,I am however not convinced she has real dementia. Memory loss likely related to depression and stress. I reviewed her chart,she had normal TSH,B12, RPR and HIV screening test done in Feb 2014. CT in 2007 was normal. Patient had been on 40 mg Prozac in the past which she stopped.  I will restart on Prozac low dose ( 20 mg) and titrate up. Patient advised to follow up in 4 wks for reassessment, if no improvement may obtain CT/MRI brain.

## 2013-01-04 NOTE — Progress Notes (Signed)
Patient ID: Gabriela Burke, female   DOB: 1950-10-03, 62 y.o.   MRN: [id number] Subjective:  Memory and concentration problem:  Gabriela Burke is a 62 y.o. female here for evaluation of memory problems. She is accompanied by no one. Primary caregiver is patient. Patient lives with her brother whom she care for since he has schizophrenia and he is paraplegic. The family and the patient identify problems with alzheimers in her mother,she was diagnosed in her 26s.. Family and patient report problems with confusion especially when she is stressed and angry. Family and patient are concerned about confusion which is intermittent,she cooks herself,, however, they are not concerned about medication errors, wandering, driving, cooking and inability to maintain adequate nutrition. Medication administration: patient self medicates. She had hx of depression for which she was supposed to be on Prozac but stopped medication for no reason. She is more concern of confusion and poor concentration problem which is intermittent and related to stress and anger.  HTN: Off her BP medication today,she is out of her medication and need refill. Hypercholesterolemia: Need medication refill.  The following portions of the patient's history were reviewed and updated as appropriate: allergies, past family history, past medical history, past social history, past surgical history and problem list.  Review of Systems Pertinent items are noted in HPI.    Objective:    BP 158/83  Pulse 80  Temp(Src) 98 F (36.7 C) (Oral)  Wt 152 lb (68.947 kg)  BMI 26.08 kg/m2  Physical Exam  Nursing note and vitals reviewed. Constitutional: She is oriented to person, place, and time. She appears well-developed. No distress.  HENT:  Head: Atraumatic.  Eyes: EOM are normal. Pupils are equal, round, and reactive to light.  Neck: Neck supple.  Cardiovascular: Normal rate, regular rhythm and normal heart sounds.   No murmur  heard. Pulmonary/Chest: Effort normal and breath sounds normal. No respiratory distress. She has no wheezes.  Abdominal: Soft. Bowel sounds are normal. She exhibits no distension. There is no tenderness.  Musculoskeletal: Normal range of motion.  Neurological: She is alert and oriented to person, place, and time. She has normal strength and normal reflexes. No cranial nerve deficit or sensory deficit. She displays a negative Romberg sign.  Psychiatric: Her speech is normal. Her mood appears not anxious. Her affect is not angry and not inappropriate. She is not agitated, not aggressive, not hyperactive and not actively hallucinating. She does not exhibit a depressed mood. She is attentive.   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

## 2013-01-04 NOTE — Assessment & Plan Note (Signed)
I refilled her BP medication. F/u with PCP for reassessment.

## 2013-01-20 ENCOUNTER — Encounter: Payer: Self-pay | Admitting: Family Medicine

## 2013-01-20 ENCOUNTER — Ambulatory Visit (INDEPENDENT_AMBULATORY_CARE_PROVIDER_SITE_OTHER): Payer: BC Managed Care – PPO | Admitting: Family Medicine

## 2013-01-20 VITALS — BP 124/78 | HR 84 | Ht 64.0 in | Wt 157.0 lb

## 2013-01-20 DIAGNOSIS — R413 Other amnesia: Secondary | ICD-10-CM

## 2013-01-20 DIAGNOSIS — F320 Major depressive disorder, single episode, mild: Secondary | ICD-10-CM

## 2013-01-20 DIAGNOSIS — E1139 Type 2 diabetes mellitus with other diabetic ophthalmic complication: Secondary | ICD-10-CM

## 2013-01-20 LAB — POCT GLYCOSYLATED HEMOGLOBIN (HGB A1C): Hemoglobin A1C: 6.5

## 2013-01-20 NOTE — Progress Notes (Signed)
  Subjective:    Patient ID: Gabriela Burke, female    DOB: Jan 25, 1951, 62 y.o.   MRN: [id number]  HPI  62 year old F who presents for follow up of memory problems. She was recently seen by Dr. Lum Babe in July 2014 and noted that the patient has an MMSE score of 20/30. However, Dr. Lum Babe was concerned that stress and depression was confounding this. She had a PHQ depression screening of 5 (mild depression).  The patient has previously been on fluoxetine, and was started on Prozac 20 mg one month ago. She notes that she is not depressed, but has lots of stress. Current stressors include caring for her brother is schizophrenic, financial stress, and a strained relationship with one of her sisters. She feels like her stress is "something she can deal with." She states that we she is stressed, she would sit in her house and cry. I asked when the last time this occurred and she said. "A while, maybe it's because I'm taking the medicine." Currently, she is able to work daily at [employer] and clean her house and care for her brother. She is most frustrated that she does not remember then names of people and places as well as she used to.  Denies any negative side effects of medication.    Current Outpatient Prescriptions on File Prior to Visit  Medication Sig Dispense Refill  . acetaminophen (TYLENOL) 500 MG tablet Take 2 tablets (1,000 mg total) by mouth every 6 (six) hours as needed for pain.  30 tablet  0  . aspirin 81 MG EC tablet Take 81 mg by mouth daily.        Marland Kitchen FLUoxetine (PROZAC) 20 MG tablet Take 1 tablet (20 mg total) by mouth daily.  30 tablet  1  . hydrochlorothiazide (HYDRODIURIL) 12.5 MG tablet Take 1 tablet (12.5 mg total) by mouth daily. Take 1 tab by mouth every morning  30 tablet  1  . lisinopril (PRINIVIL,ZESTRIL) 10 MG tablet Take 1 tablet (10 mg total) by mouth daily.  30 tablet  1  . metFORMIN (GLUCOPHAGE-XR) 750 MG 24 hr tablet Take 1 tablet (750 mg total) by mouth daily with  breakfast.  30 tablet  1  . simvastatin (ZOCOR) 40 MG tablet Take 1 tablet (40 mg total) by mouth at bedtime.  30 tablet  1   No current facility-administered medications on file prior to visit.     Review of Systems     Objective:   Physical Exam BP 124/78  Pulse 84  Ht 5\' 4"  (1.626 m)  Wt 157 lb (71.215 kg)  BMI 26.94 kg/m2  Gen: well appearing, 62 AAF, non distressed, pleasant  Neuro: no focal deficits Psych: alert and oriented x 3, normal behavior, thought content, and speech pattern     Assessment & Plan:  > 15 minutes spent with direct patient care

## 2013-01-20 NOTE — Patient Instructions (Addendum)
Ms. Krah,   I appreciate you coming in today. Let's keep the fluoxetine dose at 20 mg, and we will continue to follow up your stress level. Also, please exercise to help reduce your stress and get some fresh air.   Follow up with me in one month to talk about diabetes.   Sincerely,   Dr. Clinton Sawyer

## 2013-01-21 NOTE — Assessment & Plan Note (Addendum)
Assessment: Patient with memory loss that is not impairing her ability to care for herself, her disabled brother or perform her job at [employer]. 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 I agree with this concern and think it is too soon to repeat MMSE or PHQ-9.  Plan: Continue fluoxetine 20 mg for another month and perform repeat PHQ-9. If that is improved then continue fluoxetine and repeat MMSE in 6 months from original time. Given normal b12, TSH, neg RPR, neg HIV in Feb 2014, no need for further studies. Given lack of focal neuro deficits, no need for imaging at this time.

## 2013-01-21 NOTE — Assessment & Plan Note (Addendum)
A: mild depression, stable or improving, patient not threat to herself or others P: continue fluoxetine 20 mg daily, recheck PHQ-9 next month; patient encouraged to exercise regularly

## 2013-01-31 ENCOUNTER — Encounter: Payer: Self-pay | Admitting: Family Medicine

## 2013-01-31 ENCOUNTER — Ambulatory Visit (INDEPENDENT_AMBULATORY_CARE_PROVIDER_SITE_OTHER): Payer: BC Managed Care – PPO | Admitting: Family Medicine

## 2013-01-31 VITALS — BP 162/80 | HR 68 | Temp 98.0°F | Ht 64.0 in | Wt 156.0 lb

## 2013-01-31 DIAGNOSIS — F411 Generalized anxiety disorder: Secondary | ICD-10-CM

## 2013-01-31 DIAGNOSIS — F320 Major depressive disorder, single episode, mild: Secondary | ICD-10-CM

## 2013-01-31 DIAGNOSIS — R413 Other amnesia: Secondary | ICD-10-CM

## 2013-01-31 MED ORDER — FLUOXETINE HCL 20 MG PO TABS: 20.0000 mg | ORAL_TABLET | Freq: Every day | ORAL | Status: DC

## 2013-01-31 NOTE — Patient Instructions (Addendum)
Dear Ms. Warda,   I am glad that we saw each other today. I recommend that you meet with our psychologist, Dr. Spero Geralds, for help dealing with your stress.  You can schedule an appointment with her by calling her directly at [phone]. Also, I need you to start taking the medication called fluoxetine each day. Please place it in your glove compartment or somewhere that you remember to take it each day.   Please follow up with me in 4 weeks at your convenience.   Sincerely,   Dr. Clinton Sawyer
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
## 2013-02-01 NOTE — Assessment & Plan Note (Addendum)
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

## 2013-02-01 NOTE — Assessment & Plan Note (Addendum)
Assessment: Patient with significant depression, but also has good behavior coping skills such as prayer and confiding in co-workers, she is willing to speak with a psychologist or counselor and willing to start daily fluoxetine Plan: - Restart fluoxetine - Contact Dr. Pascal Lux - Agreed on strategy to keep medication in car and take daily with lunch - F/u in 4 weeks

## 2013-02-03 ENCOUNTER — Ambulatory Visit: Payer: BC Managed Care – PPO | Admitting: Family Medicine

## 2013-03-02 ENCOUNTER — Ambulatory Visit (INDEPENDENT_AMBULATORY_CARE_PROVIDER_SITE_OTHER): Payer: BC Managed Care – PPO | Admitting: Family Medicine

## 2013-03-02 ENCOUNTER — Telehealth: Payer: Self-pay | Admitting: Psychology

## 2013-03-02 ENCOUNTER — Encounter: Payer: Self-pay | Admitting: Family Medicine

## 2013-03-02 VITALS — BP 145/80 | HR 90 | Ht 64.0 in | Wt 158.0 lb

## 2013-03-02 DIAGNOSIS — Z23 Encounter for immunization: Secondary | ICD-10-CM

## 2013-03-02 DIAGNOSIS — F320 Major depressive disorder, single episode, mild: Secondary | ICD-10-CM

## 2013-03-02 NOTE — Patient Instructions (Signed)
It was nice to see you today. I think that you need to develop the routine for taking your fluoxetine to see if we will know that it works. ALso, I would like for you to contact Dr. Pascal Lux.   I recommend that you meet with our psychologist, Dr. Spero Geralds, for help dealing with your stress.  You can schedule an appointment with her by calling her directly at [phone].  Please come back to for a physical within the next few weeks. There are lots of things we need to for your health.   Sincerely,   Dr. Clinton Sawyer

## 2013-03-02 NOTE — Telephone Encounter (Signed)
Gabriela Burke called to schedule an appointment.  It was hard to figure out what she was asking exactly - she was reading off of a paper and seemed confused about a phone call she received that told her to come here for an appointment.  I couldn't see that she missed any appointments but she did have one today with Dr. Clinton Sawyer.  Reviewed his notes and opted to schedule her in beh med clinic at the earliest possible point - October 6th at 3:00.    I told her the following: -  If she is unable to make the appointment she needs to call me. -  If she misses the appointment without a phone call, I won't be able to schedule her back in my clinic. She voiced an understanding and was able to repeat the appointment date and time back to me.    Of note - she works early morning to early afternoon (around 1:00).

## 2013-03-02 NOTE — Progress Notes (Addendum)
  Subjective:    Patient ID: Gabriela Burke, female    DOB: 11-Dec-1950, 62 y.o.   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 The patient presents for followup of depression. The patient was last seen for this problem on August 25 and at that time was instructed to increase her fluoxetine to 40 mg daily and contact our clinical psychologist for appointment.  Today she states "my wife stinks and my family stinks." She also notes that she is very fatigued and worn down and having difficulty caring for her brother with schizophrenia. Additionally, she has a nephew who lives in her home who does not be rather have a job and creates conflict with the patient. I acknowledges that these were all the problems that were bothering her previously and asked if they had improved. She says that they have not. She says that she deals with them by talking to me (her physician) and singing Christian hymns. She uses her faith to improve her mood.   The patient denies taking fluoxetine 40 mg daily as prescribed. Additionally she has not contacted our clinical psychologist Spero Geralds. These 2 strategies were the points of emphasis from her previous visit. I asked why she was unable to do these things, and she says because she cannot remember. However when asked, she does not forget to give her brother medications when they are due and does not forget her duties around the house or at work. She recognizes that she does not take care of herself much takes care of other people. When asked specifically what she thinks that I can do to help [place] her primary care physician, she does not have a specific answer.   Review of Systems Positive for right shoulder pain Negative for suicidal ideation, homicidal ideation    Objective:   Physical Exam BP 145/80  Pulse 90  Ht 5\' 4"  (1.626 m)  Wt 158 lb (71.668 kg)  BMI 27.11 kg/m2  Gen: well  appearing, elderly AAF,distressed  Neuro: no focal deficits  Psych: alert and oriented x 3, denies SI/HI, intermittently tearful, difficult to redirect at times but no flight of ideas or pressured speech, no evidence of reaction to internal stimuli     Assessment & Plan:  > 15 minutes spent with direct patient care

## 2013-03-02 NOTE — Assessment & Plan Note (Signed)
Assessment: persistent depression without adherence to any strategies that we previously discussed, reason for this is unclear given that she is quite capable of taking care of her disabled brother and her duties at work, therefore did not believe she's to forgetting to take her medication, but she does not offer any explanation otherwise Plan: patient given continued encouragement to take care of herself just she takes care of others, specifically she needs to take the medication daily each time that she washes her face, she needs to call our clinical psychologist; the number to the clinical psychologist was given to the patient, placed in her cell phone, and patient was directed to call the psychologist from our clinic before she leaves; follow up in 2 weeks

## 2013-03-14 ENCOUNTER — Ambulatory Visit: Payer: BC Managed Care – PPO | Admitting: Psychology

## 2013-03-14 NOTE — Telephone Encounter (Signed)
Patient did not show for initial behavioral medicine appointment.  Will let Dr. Clinton Sawyer know.

## 2013-03-21 ENCOUNTER — Ambulatory Visit (INDEPENDENT_AMBULATORY_CARE_PROVIDER_SITE_OTHER): Payer: BC Managed Care – PPO | Admitting: Family Medicine

## 2013-03-21 ENCOUNTER — Encounter: Payer: Self-pay | Admitting: Family Medicine

## 2013-03-21 VITALS — BP 157/76 | HR 77 | Temp 98.1°F | Ht 66.0 in | Wt 157.0 lb

## 2013-03-21 DIAGNOSIS — Z9119 Patient's noncompliance with other medical treatment and regimen: Secondary | ICD-10-CM

## 2013-03-21 DIAGNOSIS — E119 Type 2 diabetes mellitus without complications: Secondary | ICD-10-CM

## 2013-03-21 DIAGNOSIS — Z91148 Patient's other noncompliance with medication regimen for other reason: Secondary | ICD-10-CM | POA: Insufficient documentation

## 2013-03-21 DIAGNOSIS — Z9114 Patient's other noncompliance with medication regimen: Secondary | ICD-10-CM

## 2013-03-21 DIAGNOSIS — Z23 Encounter for immunization: Secondary | ICD-10-CM

## 2013-03-21 DIAGNOSIS — E1139 Type 2 diabetes mellitus with other diabetic ophthalmic complication: Secondary | ICD-10-CM

## 2013-03-21 LAB — LIPID PANEL
Cholesterol: 223 mg/dL — ABNORMAL HIGH (ref 0–200)
HDL: 57 mg/dL (ref >39)
LDL Cholesterol: 144 mg/dL — ABNORMAL HIGH (ref 0–99)
Total CHOL/HDL Ratio: 3.9 Ratio
Triglycerides: 110 mg/dL (ref <150)
VLDL: 22 mg/dL (ref 0–40)

## 2013-03-21 NOTE — Progress Notes (Signed)
Patient ID: Gabriela Burke, female   DOB: Jan 23, 1951, 62 y.o.   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 acetaminophen (TYLENOL) 500 MG tablet Take 2 tablets (1,000 mg total) by mouth every 6 (six) hours as needed for pain.  30 tablet  0  . aspirin 81 MG EC tablet Take 81 mg by mouth daily.        Marland Kitchen FLUoxetine (PROZAC) 20 MG tablet Take 1 tablet (20 mg total) by mouth daily.  30 tablet  1  . hydrochlorothiazide (HYDRODIURIL) 12.5 MG tablet Take 1 tablet (12.5 mg total) by mouth daily. Take 1 tab by mouth every morning  30 tablet  1  . lisinopril (PRINIVIL,ZESTRIL) 10 MG tablet Take 1 tablet (10 mg total) by mouth daily.  30 tablet  1  . metFORMIN (GLUCOPHAGE-XR) 750 MG 24 hr tablet Take 1 tablet (750 mg total) by mouth daily with breakfast.  30 tablet  1  . simvastatin (ZOCOR) 40 MG tablet Take 1 tablet (40 mg total) by mouth at bedtime.  30 tablet  1   No current facility-administered medications on file prior to visit.     Review of Systems:  A comprehensive review of systems was negative.     Objective:   Physical Exam: BP 157/76  Pulse 77  Temp(Src) 98.1 F (36.7 C) (Oral)  Ht 5\' 6"  (1.676 m)  Wt 157 lb (71.215 kg)  BMI 25.35 kg/m2  General: alert, well appearing, and in no distress Lungs: CTA-B Eyes:  fundoscopic exam normal Cardiovascular: RRR, nl S1 and S2, no carotid bruits Feet: warm, good capillary refill, negative monofilament testing  Labs:   Diabetic Labs:  Lab Results  Component Value Date   HGBA1C 6.5 01/20/2013   HGBA1C 6.2 09/28/2012   HGBA1C 6.3 01/21/2012   Lab Results  Component Value Date   MICROALBUR neg 01/08/2006   LDLCALC 86 07/25/2009   CREATININE 0.77 01/21/2012   Last microalbumin: Lab Results  Component Value Date   MICROALBUR neg 01/08/2006        Assessment & Plan:  > 25 minutes spent evaluating patient and coordinating care

## 2013-03-21 NOTE — Assessment & Plan Note (Addendum)
Assessment: patient presents unique challenge in often stating that she does not want to take meds and also states that if she is stressed then she cannot remember to take medications, however I have suggested pill boxes, which she says do not help her and she also does not take her SSRI to help with stress Plan: I encouarged to take only aspirin and lisinopril for now to assess how she handles fewer meds, f/u in 1 month

## 2013-03-21 NOTE — Patient Instructions (Signed)
It was nice to see you today. I want you to do the following things.   1. Take aspirin each day and take lisinopril for your blood pressure  2. Have your eye doctor send me the results of your evaluation.   I will let you know the results of the cholesterol and urine tests. Please follow up 1 month. You need a Pap smear also, which we can do at your next visit.   Sincerely,   Dr. Clinton Sawyer

## 2013-03-21 NOTE — Assessment & Plan Note (Signed)
Assessment: Well controlled despite non  compliance with medications, likely due to healthy diet and active lifestyle Plan:  - Hold metformin since benefit likely very minimal since A1C 6.5 and patient does not want medication - Check cholesterol today and assess CV risk in order to inform patient about using statin - Check urine microalbumin  - Given pneumonia vaccine today - F/u A1C in November

## 2013-03-22 LAB — MICROALBUMIN / CREATININE URINE RATIO
Creatinine, Urine: 130.9 mg/dL
Microalb Creat Ratio: 7.1 mg/g (ref 0.0–30.0)
Microalb, Ur: 0.93 mg/dL (ref 0.00–1.89)

## 2013-03-23 ENCOUNTER — Encounter: Payer: Self-pay | Admitting: Family Medicine

## 2013-04-21 ENCOUNTER — Ambulatory Visit: Payer: BC Managed Care – PPO | Admitting: Family Medicine

## 2013-05-03 ENCOUNTER — Telehealth: Payer: Self-pay

## 2013-05-03 NOTE — Telephone Encounter (Signed)
Called patient's sister back and stated clearly that I could not discuss patient with her due to HIPPA.  I suggested she attend an office visit with her sister.  Pt agreeable.  Romney Compean, Darlyne Russian, CMA

## 2013-05-03 NOTE — Telephone Encounter (Signed)
Sister calls very concerned about patient's health. She states that is having severe memory issues and now is having hoarding issues. She is unable to keep up with her bills. Sister would like to speak to MD/nurse about this matter. Patient has made numerous appts for her memory loss but does not keep most of them. Please call Darden Amber at [phone].

## 2013-05-04 NOTE — Telephone Encounter (Signed)
I agree with advice provided by CMA and look forward to visit with patient and her sister.

## 2013-05-19 ENCOUNTER — Ambulatory Visit (INDEPENDENT_AMBULATORY_CARE_PROVIDER_SITE_OTHER): Payer: BC Managed Care – PPO | Admitting: Family Medicine

## 2013-05-19 ENCOUNTER — Encounter: Payer: Self-pay | Admitting: Family Medicine

## 2013-05-19 VITALS — BP 155/90 | HR 69 | Temp 98.1°F | Ht 64.0 in | Wt 153.0 lb

## 2013-05-19 DIAGNOSIS — R413 Other amnesia: Secondary | ICD-10-CM

## 2013-05-19 DIAGNOSIS — F0390 Unspecified dementia without behavioral disturbance: Secondary | ICD-10-CM

## 2013-05-19 DIAGNOSIS — E1139 Type 2 diabetes mellitus with other diabetic ophthalmic complication: Secondary | ICD-10-CM

## 2013-05-19 LAB — POCT GLYCOSYLATED HEMOGLOBIN (HGB A1C): Hemoglobin A1C: 6.3

## 2013-05-19 NOTE — Progress Notes (Signed)
   Subjective:    Patient ID: Gabriela Burke, female    DOB: 07-09-1950, 62 y.o.   MRN: [id number]  HPI  62 year old F with DM and HTN who presents with her sister for evaluation of memory issues. She presents today with her sister.   Memory Problems - Initially evaluated July 2014. MMSE 20/30. Vit B12, RPR, HIV, TSH WNL. 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 She does not take medications regularly, is having difficulty remembering to pay bills, and also gets lost occasionally. Her sister is her with her for the first time. Her sister is very concerned about her health and would like to be the healthcare POA to help Gabriela Burke pay bills. She notes that Gabriela Burke has missed numerous payments on bills and lost money. This a  [place] source of distress for the patient's sister.   Sister: Gabriela Burke [phone] cell, [phone] home  Review of Systems See HPI    Objective:   Physical Exam BP 155/90  Pulse 69  Temp(Src) 98.1 F (36.7 C) (Oral)  Ht 5\' 4"  (1.626 m)  Wt 153 lb (69.4 kg)  BMI 26.25 kg/m2  Gen: well appearing middle aged female Neuro: CN II-XII grossly intact, no focal deficits      Assessment & Plan:

## 2013-05-19 NOTE — Patient Instructions (Signed)
Ms. Halbleib,   It was nice to see you. Thank you for bringing in your sister so we could get ot know each other. We need to work on strategies to help you in your home and with bill payments. I will contact you and your sister while I work on some of this information.   Sincerely,   Dr. Clinton Sawyer

## 2013-05-22 NOTE — Assessment & Plan Note (Addendum)
A: active memory loss over several years without focal deficits concerning for alzheimer's dementia with possible component of vascular given risk factors for strokes with HTN, HLD, DM P:  - Will make referral for home health CT - Will contact social work regarding setting up POA - Not medication changes since pt does not remember to take current medications

## 2013-05-25 ENCOUNTER — Telehealth: Payer: Self-pay | Admitting: Clinical

## 2013-05-25 NOTE — Telephone Encounter (Signed)
Clinical Child psychotherapist (CSW) received a returned call from pt sister. CSW provided sister with information regarding HCPOA and financial POA. CSW informed sister that CSW could provide sister with a HCPOA packet & encouraged sister and pt to read it over and have it notarized (if pt is able to complete.) CSW also provided sister with the contact information for the [employer] firm to assist with the financial POA/guardianship if need be. Sister appreciative of information.  Theresia Bough, MSW, LCSW [phone]

## 2013-05-25 NOTE — Telephone Encounter (Signed)
Clinical Child psychotherapist (CSW) left a message for pt sister to provide resources regarding becoming HCPOA for pt.  Theresia Bough, MSW, LCSW [phone]

## 2013-06-24 ENCOUNTER — Telehealth: Payer: Self-pay | Admitting: Family Medicine

## 2013-06-24 NOTE — Telephone Encounter (Signed)
Brooke called from [place] and needs the pt health history, med list faxed to them at [phone]. jw

## 2013-06-27 NOTE — Telephone Encounter (Signed)
Issue of home health discussed at previous visit. Pt may be confused. Please let her know that in order to get home health set up for her, then we need to involve [facility], and part of that involvement means sharing medical information. If she still does not consent, then please let me know and I will call her.

## 2013-06-27 NOTE — Telephone Encounter (Signed)
called pt in order to get permission. She reports, that she has no idea why we would receive a call like that. Did not give permission. Will fwd. To PCP for advise.  I do see an order for [facility] in chart. [name], Gerrit Heck

## 2013-06-28 NOTE — Telephone Encounter (Signed)
Called pt's house again. Pt is not there. Nephew answered the phone. Asked him to have pt call us please. Waiting for call back.  [name], Gerrit Heck

## 2013-07-14 ENCOUNTER — Telehealth: Payer: Self-pay | Admitting: *Deleted

## 2013-07-14 NOTE — Telephone Encounter (Signed)
Sister called back and stated that she needed to talk with provider asap.  At her wit's end about what to do.  Please call at earliest convenience.

## 2013-07-14 NOTE — Telephone Encounter (Signed)
Received call from pt's sister Gwendolyn.  She has concerns that pt is not taking her medication correctly and that she is misplacing the medication.  She also stated she needed assistance with financial support to help with sister's bills.  Gwendolyn stated that she thinks that pt disease is getting worse and pt needs someone to help at home.  Pt's brother is living with her and can't her.  Sister is concerned that pt is still driving and can't remember where is going or thinking she is going to work.  Gwendolyn would like to speak with pt's doctor and a Education officer, museum ASAP.  She wants someone to call her today.   Derl Barrow, RN

## 2013-07-15 NOTE — Telephone Encounter (Signed)
Tried again.  No answer.

## 2013-07-15 NOTE — Telephone Encounter (Signed)
Spoke with patient's sister who is seeking guardianship through the court system. She is most concerned about her sister's inability to manage finances. She does not think that the patient is a danger to herself or anyone else. I will call the patient to arrange a home visit for 07/21/13.

## 2013-07-15 NOTE — Telephone Encounter (Signed)
Returned call. No answer. Left message. Will try again later. If she calls back, please page me at [phone]. Thank you.

## 2013-07-19 NOTE — Telephone Encounter (Signed)
I spoke with the pt and told her that I would like to see her in light of recent conversations with her sister. She was agreeable. She said that she is amenable to a home visit at 11:00 AM on Thursday 2/12.

## 2013-07-21 ENCOUNTER — Telehealth: Payer: Self-pay | Admitting: Family Medicine

## 2013-07-21 NOTE — Telephone Encounter (Signed)
I called the patient to confirm that she was home, because we arranged a home visit for 11 AM this morning. Her brother answered and stated that she was not home. She is supposed to return at [facility]. I tried her cell phone but did not get through. I tried to reach her sister to see if there is an alternative number but got no answer.

## 2013-08-23 ENCOUNTER — Other Ambulatory Visit: Payer: Self-pay | Admitting: *Deleted

## 2013-08-24 ENCOUNTER — Other Ambulatory Visit: Payer: Self-pay | Admitting: Family Medicine

## 2013-08-24 MED ORDER — HYDROCHLOROTHIAZIDE 12.5 MG PO TABS: 12.5000 mg | ORAL_TABLET | Freq: Every day | ORAL | Status: AC

## 2013-08-24 MED ORDER — LISINOPRIL 10 MG PO TABS: 10.0000 mg | ORAL_TABLET | Freq: Every day | ORAL | Status: DC

## 2013-08-24 MED ORDER — HYDROCHLOROTHIAZIDE 12.5 MG PO TABS: 12.5000 mg | ORAL_TABLET | Freq: Every day | ORAL | Status: DC

## 2013-09-19 ENCOUNTER — Telehealth: Payer: Self-pay | Admitting: Family Medicine

## 2013-09-19 NOTE — Telephone Encounter (Signed)
Pt's sister needs for Dr. Maricela Bo to contact her concerning pt at [phone].

## 2013-09-20 NOTE — Telephone Encounter (Signed)
Gwendolyn (sister) called back and she wants Dr.Williamson to know a few things about her sister. Pt does not pay her bills and hides her money. [name] is very frustrated, because when ever she pays the bills, the pt does not pay her back. She is worried about pt getting her tax return back and not paying bills. She worries about pt will end up with no place to stay. Patient talks about the past all the time and does not want to accept help. She takes care of her brother (21 years old) and has been taking care of him for a long time. Pt has upcoming appt 09/26/13 with Dr.Williamson. [name] will try her best to get her to keep the appt. I told Gwendolyn that I would send her message to Dr.Williamson, but he is not in the office for a few days. Maybe we can ask the social worker for help? Not sure, if pt would accept this. Fwd to PCP and N.Wilson for review. Thanks. Mauricia Area

## 2013-09-20 NOTE — Telephone Encounter (Signed)
Called pt's sister. LMVM to call back. Mauricia Area

## 2013-09-21 NOTE — Telephone Encounter (Signed)
If the patient is alert and oriented there is nothing that can be done.Marland KitchenMarland KitchenIf the sister has fears about patient (or brothers) safety/ability to care for herself  then she can contact Adult Protective Services [phone] however unless the patient lacks capacity there is no case here.Marland KitchenMarland Kitchen

## 2013-09-21 NOTE — Telephone Encounter (Signed)
Left message for pt's sister. Told her that I will call back tomorrow to discuss current issues and plan for improvement.

## 2013-09-21 NOTE — Telephone Encounter (Signed)
Pt's sister, Gabriela Burke, returned my call and discussed case with me. Her sister continues to have difficulty managing money, including losing money, forgetting to pay bills, and not signing up for health insurance. Furthermore, the patient is having trouble taking care of her home (like mowing the lawn) and taking care of her brother, [name]. Gabriela Burke (DOB 06/23[birth year]40), who has schizophrenia. The patient's sister has applied for guardianship and is waiting to hear back from the the courts.   The sister does not feel like [name] is an immediate danger to herself or others. Therefore, I told her that a possible option is to file a concern with adult protective services. We will discuss this on Monday 4/20 at 3:30 PM.

## 2013-09-26 ENCOUNTER — Encounter: Payer: Self-pay | Admitting: Family Medicine

## 2013-09-26 ENCOUNTER — Ambulatory Visit (INDEPENDENT_AMBULATORY_CARE_PROVIDER_SITE_OTHER): Payer: BC Managed Care – PPO | Admitting: Family Medicine

## 2013-09-26 VITALS — BP 151/76 | HR 76 | Temp 98.1°F | Ht 64.0 in | Wt 156.0 lb

## 2013-09-26 DIAGNOSIS — R413 Other amnesia: Secondary | ICD-10-CM

## 2013-09-26 NOTE — Progress Notes (Signed)
   Subjective:    Patient ID: Gabriela Burke, female    DOB: 1950/11/30, 63 y.o.   MRN: [id number]  HPI  63 year old pt following up for dementia. Presents with her sister Gabriela Burke.  Memory Deficit: Initially evaluated July 2014. MMSE 20/30. Vit B12, RPR, HIV, TSH WNL.  She has a well documented history of memory loss (see previous visit notes and telephone calls). Currently having greater trouble functioning at home including house care and paying bills. She cannot remember to take medications. Her younger sister, Gabriela Burke, has been paying bills, because the patient forgets to do so or loses her money after she gets paid. 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 Her sister has applied for guardianship through the court system and is still waiting for an answer. Today, the patient again recognizes that her memory problems are prevalent, but she associates them with stress. She states that her stress is increased by her sister Gabriela Burke (who is in the room for the interview), and the patient becomes confrontational each time that her sister suggests that she is trying to help. When asked specific questions about taking medicines or paying bills, the patient answers "yes" that she is able to complete these tasks. However, she cannot remember the name of any medications that she takes and cannot remember when she pays her bills. She does admit to forgetting things around the house, but believes that her brother, [name]. Dalbert Batman (DOB 1/06/13/40) can help her remember.   Current Outpatient Prescriptions on File Prior to Visit  Medication Sig Dispense Refill  . acetaminophen (TYLENOL) 500 MG tablet Take 2 tablets (1,000 mg total) by mouth every 6 (six) hours as needed for pain.  30 tablet  0  . aspirin 81 MG EC tablet Take 81 mg by mouth daily.        Marland Kitchen FLUoxetine (PROZAC) 20 MG tablet Take 1 tablet (20 mg total) by mouth daily.  30 tablet  1  .  hydrochlorothiazide (HYDRODIURIL) 12.5 MG tablet Take 1 tablet (12.5 mg total) by mouth daily. Take 1 tab by mouth every morning  30 tablet  5  . lisinopril (PRINIVIL,ZESTRIL) 10 MG tablet Take 1 tablet (10 mg total) by mouth daily.  30 tablet  5  . metFORMIN (GLUCOPHAGE-XR) 750 MG 24 hr tablet Take 1 tablet (750 mg total) by mouth daily with breakfast.  30 tablet  1  . simvastatin (ZOCOR) 40 MG tablet Take 1 tablet (40 mg total) by mouth at bedtime.  30 tablet  1   No current facility-administered medications on file prior to visit.   meds - Patient not taking any medications  Review of Systems Negative for head trauma, recent illness, substance abuse    Objective:   Physical Exam BP 151/76  Pulse 76  Temp(Src) 98.1 F (36.7 C) (Oral)  Ht 5\' 4"  (1.626 m)  Wt 156 lb (70.761 kg)  BMI 26.76 kg/m2  Gen: eldlery AAF, appears stated age, mild distress Psych: normal affect, but quick to anger when asked specific questions; Mini Cog - 0/3 recall and could not draw clock      Assessment & Plan:   > 30 minutes

## 2013-09-27 ENCOUNTER — Telehealth: Payer: Self-pay | Admitting: Family Medicine

## 2013-09-27 NOTE — Telephone Encounter (Signed)
Attempted to call [place]. No answer.

## 2013-09-27 NOTE — Assessment & Plan Note (Addendum)
A: Dementia, most consistent with alzheimer's type P:  - Given that patient is resistant to assistance from her sister and is having difficulty completing tasks that need to be done to maintain her home and care for her brother, I believe it best to contact adult protective services to see if they will perform a home assessment  - I do not feel that she is an immediate threat to herself or others, so I cannot suggest that she be hospitalized for clearance - I will provide a letter to her sister, Pricilla Handler, in support of her guardianship  - No medication warranted, as she cannot remember to take them  - F/u in 1 month

## 2013-09-29 NOTE — Telephone Encounter (Signed)
Thank you for the update. Let me know if I can assist in anyway. [name], MSW, [place]

## 2013-09-29 NOTE — Telephone Encounter (Signed)
Left message with sister, Pricilla Handler, letting her know that I have contact Adult Protective Services and that I'm waiting for response. Additionally, I informed her that I left a letter for her at the front desk.
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
## 2013-09-29 NOTE — Telephone Encounter (Signed)
Left message for social services to return my call on pager.

## 2013-10-03 ENCOUNTER — Encounter: Payer: Self-pay | Admitting: Family Medicine

## 2013-10-03 NOTE — Progress Notes (Signed)
Sister, Pricilla Handler, stopped by and has questions to ask you concerning her sister and getting a medical power of attorney.  Please call her at [phone].

## 2013-10-05 ENCOUNTER — Telehealth: Payer: Self-pay | Admitting: Family Medicine

## 2013-10-05 NOTE — Progress Notes (Signed)
Pt's sister called. No answer. Message left.

## 2013-10-05 NOTE — Telephone Encounter (Signed)
Ms Elonda Husky returned dr Fayrene Fearing call

## 2013-10-12 ENCOUNTER — Telehealth: Payer: Self-pay | Admitting: Family Medicine

## 2013-10-12 NOTE — Telephone Encounter (Signed)
Left message for sister. Will try to call later.

## 2013-10-12 NOTE — Telephone Encounter (Signed)
I spoke with patient who is still trying to work through the court system to get guardianship of her sister. She did not pick up the letter that I wrote on her behalf. Therefore, I will make sure that it is available for pick up tomorrow morning.

## 2013-10-12 NOTE — Telephone Encounter (Signed)
Sister called and would like the doctor to do a home visit with Gabriela Burke. jw

## 2013-10-12 NOTE — Telephone Encounter (Signed)
Will fwd to MD.  Lazaro Arms, CMA

## 2013-10-24 ENCOUNTER — Telehealth: Payer: Self-pay | Admitting: Family Medicine

## 2013-10-24 NOTE — Telephone Encounter (Signed)
I left a message for the case worker, Arville Go, at [employer], to let her know that I do have concerns about the patient's safety due to her dementia. However, I do not think that she is in immediate danger or being harmed. I left message for her to call back to the clinic as needed.

## 2013-10-24 NOTE — Telephone Encounter (Signed)
Gabriela Burke from Adult Protective Services calling to speak with Dr. Maricela Bo. Would like to know if MD has any concerns for the safety of this patient. Please call Arville Go at [phone].

## 2013-11-04 ENCOUNTER — Encounter: Payer: Self-pay | Admitting: Family Medicine

## 2013-11-04 NOTE — Progress Notes (Unsigned)
Sister, Pricilla Handler, would you to give her a call today when you have the time.    [phone]

## 2013-11-07 NOTE — Progress Notes (Unsigned)
Patient ID: Gabriela Burke, female   DOB: 10-23-1950, 63 y.o.   MRN: [id number] Pt sister called. Ms. Gabriela Burke stated that she was not granted guardianship in court, because her sister was not deemed to be incompetent. No documents of the proceedings were given. Ms. Gabriela Burke states that she is still worried about Ms. Gabriela Burke's ability to pay her bills or take care of her brother, Gabriela Burke. I stated that I would call Gabriela Burke at [employer] to update her and send a letter to Gabriela Burke's MD. Ms. Gabriela Burke was agreeable to this plan.

## 2013-11-24 ENCOUNTER — Telehealth: Payer: Self-pay | Admitting: Family Medicine

## 2013-11-30 NOTE — Telephone Encounter (Signed)
Called Pt to schedule for A1C and LDL lab work as part of DM care. Please make appointment upon returned call.

## 2013-12-08 ENCOUNTER — Encounter (HOSPITAL_COMMUNITY): Payer: Self-pay | Admitting: Emergency Medicine

## 2013-12-08 ENCOUNTER — Emergency Department (HOSPITAL_COMMUNITY)
Admission: EM | Admit: 2013-12-08 | Discharge: 2013-12-08 | Disposition: A | Discharge status: Home / Self Care | Payer: BC Managed Care – PPO | Attending: Emergency Medicine | Admitting: Emergency Medicine

## 2013-12-08 DIAGNOSIS — Z79899 Other long term (current) drug therapy: Secondary | ICD-10-CM | POA: Insufficient documentation

## 2013-12-08 DIAGNOSIS — R55 Syncope and collapse: Secondary | ICD-10-CM

## 2013-12-08 DIAGNOSIS — E785 Hyperlipidemia, unspecified: Secondary | ICD-10-CM | POA: Insufficient documentation

## 2013-12-08 DIAGNOSIS — I10 Essential (primary) hypertension: Secondary | ICD-10-CM | POA: Insufficient documentation

## 2013-12-08 DIAGNOSIS — Z91040 Latex allergy status: Secondary | ICD-10-CM | POA: Insufficient documentation

## 2013-12-08 DIAGNOSIS — E119 Type 2 diabetes mellitus without complications: Secondary | ICD-10-CM | POA: Insufficient documentation

## 2013-12-08 LAB — URINALYSIS, ROUTINE W REFLEX MICROSCOPIC
Bilirubin Urine: NEGATIVE
Glucose, UA: NEGATIVE mg/dL
Ketones, ur: NEGATIVE mg/dL
Leukocytes, UA: NEGATIVE
Nitrite: NEGATIVE
Protein, ur: NEGATIVE mg/dL
Specific Gravity, Urine: 1.016 (ref 1.005–1.030)
Urobilinogen, UA: 0.2 mg/dL (ref 0.0–1.0)
pH: 6 (ref 5.0–8.0)

## 2013-12-08 LAB — URINE MICROSCOPIC-ADD ON

## 2013-12-08 LAB — CBG MONITORING, ED: Glucose-Capillary: 90 mg/dL (ref 70–99)

## 2013-12-08 NOTE — ED Notes (Signed)
Pt escorted home by neighbor Levada Dy

## 2013-12-08 NOTE — ED Notes (Signed)
Call Levada Dy for ride home [phone]
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
## 2013-12-12 ENCOUNTER — Telehealth: Payer: Self-pay | Admitting: Family Medicine

## 2013-12-12 NOTE — Telephone Encounter (Signed)
Sister calls, concerned that patient is getting worse with memories issues. Would like to speak to MD. Please advise.

## 2013-12-13 NOTE — Telephone Encounter (Signed)
Patient sister states patient was seen at the ED and also experiencing severe memory loss.She has never been seen by Dr Raeford Razor the new PCP.After a long conversation with patients sister,she's seemed very frustrated and concered because she refuses to take her medication or to be seen.I informed sister that she need to establish care with new PCP and also follow up ED visit.She's requesting advise on what to do.Please advise.Thank you.Caroline Longie, Lewie Loron

## 2013-12-13 NOTE — Telephone Encounter (Signed)
Patient needs to establish care with myself before I can provide advice for a treatment plan.   [name], MD PGY-2, [facility] 12/13/2013, 11:59 AM

## 2013-12-14 NOTE — Telephone Encounter (Signed)
[place]

## 2013-12-14 NOTE — Telephone Encounter (Deleted)
Sister was advised to have patient schedule an appointment with her new PCP.She states that patient refuses to take medication or to be seen.Axie Hayne, Lewie Loron

## 2013-12-14 NOTE — Telephone Encounter (Signed)
Tried reach patient's sister Pricilla Handler @[phone].Unable to leave message,voicemail is full.I'll wait until she calls back.Gabriela Burke, Gabriela Burke

## 2014-01-02 ENCOUNTER — Ambulatory Visit (INDEPENDENT_AMBULATORY_CARE_PROVIDER_SITE_OTHER): Payer: BC Managed Care – PPO | Admitting: Family Medicine

## 2014-01-02 ENCOUNTER — Encounter: Payer: Self-pay | Admitting: Family Medicine

## 2014-01-02 VITALS — BP 122/62 | HR 94 | Temp 98.3°F | Wt 153.0 lb

## 2014-01-02 DIAGNOSIS — R413 Other amnesia: Secondary | ICD-10-CM

## 2014-01-02 DIAGNOSIS — M545 Low back pain, unspecified: Secondary | ICD-10-CM

## 2014-01-02 NOTE — Progress Notes (Signed)
Subjective:    Patient ID: Gabriela Burke, female    DOB: Jan 18, 1951, 63 y.o.   MRN: [id number]  HPI Gabriela Burke is here for back pain and worsening memory loss with associated depression, anxiety, and agitation.  She is accompanied by her neighbor who provides history.   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  She was started on fluoxetine at that time but hasn't taken any of the medication secondary to not remembering. 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 She still drives her brother to his appointments. She was evaluated by [facility] but discharged as they felt her problems were psychiatric in nature. MMSE performed roughly a year ago 20/30. Patient was found to be competent and her sister (Gabriela Burke) was not granted guardianship in court.   Patient she reports worsening depresion, anxiety, aggistation and memory loss that is associated with stressful situations.  Her stressful situation are associated with her family members, her job and taking care of her brother. Cyring and not remembering things that have occurred within a couple of days. She has been found outside her house but doesn't remember leaving.  She misses appts with no known membory.  Doesn't take medicine on a regular basis. She drives her brother and doesn't have any problem with that because that is her normal routine. Anyting outside of that, she has problems remembering.  Doesn't remember how the indicinent with her sister started.  She has forgotten to pay bills.   BACK PAIN  Location: lower back Quality: sharp Onset: 2-3 months  Worse with: strain and picking up things  Better with: sleep Radiation: none Best sitting/standing/leaning forward: nothing  Mechanical: (Strain, herniated disc, Osteoporosis, spinal stenosis,  fractures, scoliosis, kyphosis, lordosis)  Trauma: none Motor Deficit: none Steroid use: no Age: >60 years of age yes  Non mechanical: (Neoplastic, Infection, Inflammatory, Paget's)  Red Flags:  Fecal/urinary incontinence: no  Numbness/Weakness: no  Fever/chills/sweats: yes, sweats but nothing new.   Night pain: yes, but same pain from the day  Unexplained weight loss: no  No relief with bedrest: no  h/o cancer/immunosuppression: no  IV drug use: no  PMH of osteoporosis or chronic steroid use: unknown    Current Outpatient Prescriptions on File Prior to Visit  Medication Sig Dispense Refill  . hydrochlorothiazide (HYDRODIURIL) 12.5 MG tablet Take 1 tablet (12.5 mg total) by mouth daily. Take 1 tab by mouth every morning  30 tablet  5  . lisinopril (PRINIVIL,ZESTRIL) 10 MG tablet Take 1 tablet (10 mg total) by mouth daily.  30 tablet  5   No current facility-administered medications on file prior to visit.    Review of Systems See HPI     Objective:   Physical Exam BP 122/62  Pulse 94  Temp(Src) 98.3 F (36.8 C) (Oral)  Wt 153 lb (69.4 kg) Gen: NAD, alert, cooperative with exam,  CV: RRR, good S1/S2, no murmur, no edema, capillary refill brisk  Resp: CTABL, no wheezes, non-labored Skin: no rashes, normal turgor  Neuro: 5/5 strength in UE/LE b/l, normal sensation in LE, normal grip strength,  Psych: normal affect, appears annoyed when having to be re-directed, patient perseverating on her brother and need constant redirection, she is alert, can recall her daily routine consisting of taking care of her brother but unsure  of which medications she has taken last.        Assessment & Plan:

## 2014-01-02 NOTE — Assessment & Plan Note (Signed)
Appears to be muscle strain. 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  Negative for any red flags in history or exam.  - given home modalities.  - rest, ice and heat
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
## 2014-01-02 NOTE — Patient Instructions (Signed)
Thank you for coming in,   I am making a referral to geriatric clinic.   I am making a referral to home health for an in home evaluation.    Please feel free to call with any questions or concerns at any time, at [phone]. --Dr. Raeford Razor

## 2014-01-03 ENCOUNTER — Telehealth: Payer: Self-pay | Admitting: Clinical

## 2014-01-03 NOTE — Telephone Encounter (Signed)
CSW contacted pt and caregiver Gabriela Burke [phone] to inform them that pt does not qualify for [facility] services because she is not home bound. CSW confirmed that pt works and continues to drive. Levada Dy appreciative of information.  [name], MSW, [place]

## 2014-01-10 ENCOUNTER — Ambulatory Visit: Payer: BC Managed Care – PPO

## 2014-01-24 ENCOUNTER — Ambulatory Visit (INDEPENDENT_AMBULATORY_CARE_PROVIDER_SITE_OTHER): Payer: BC Managed Care – PPO | Admitting: Family Medicine

## 2014-01-24 ENCOUNTER — Encounter: Payer: Self-pay | Admitting: Family Medicine

## 2014-01-24 ENCOUNTER — Telehealth: Payer: Self-pay | Admitting: *Deleted

## 2014-01-24 VITALS — BP 135/79 | HR 64 | Temp 97.7°F | Wt 146.0 lb

## 2014-01-24 DIAGNOSIS — F0390 Unspecified dementia without behavioral disturbance: Secondary | ICD-10-CM | POA: Diagnosis not present

## 2014-01-24 NOTE — Patient Instructions (Signed)
Will schedule an brain MRI to look for causes of dementia Will have you follow up with Dr Raeford Razor to go over results and recommendations.    Tax adviser  Address :[place] : [place] Zip : [place] Website : [email or web address] Contact Email : [email or web address] Office Phone : [phone] Information Phone : [phone] Special Notes : [facility]- [phone] [place]- [phone]  Senior Resources of [place] can provide information on local resources including:   Information and Referral (Senior InfoLine) providing seniors, family members, caregivers and others access to information and assistance for the elderly.  Home Delivered Meals (Meals on Wheels)  [place] for those ages 39 and over are offered daytime supervised programs targeted to meet their social, educational, physical and recreational interests. A lunch is also served to those who meet the eligibility criteria.  Caregivers - Volunteer-based program designed to provide seniors with 1-3 hours of service per week. Services include friendly visiting, assistance with grocery shopping, errands, general transportation, etc.  Medical Transportation: Transportation to medical appointments for seniors ages 20 and over who do not receive Medicaid. Funds are occasionally available to transport disabled individuals under 60 who do not receive Medicaid.  [place] (S.H.I.I.P.): Volunteers are trained by the [place]  S.H.I.I.P. office, to provide free information, counseling and education to [employer] beneficiaries and their family members about: Medicare, Medicare Supplements, [place], and [place] Prescription Drug Plans.  Nutritional Supplement Program: Ensure, Ensure Plus, and Glucerna products are sold at reduced prices. Simply  fill out an application.    [place]  Address :[place] : [place] : [place] Zip : [place] Website : [email or web address] Contact Email : [email or web address] Office Phone : [phone] Information Phone : [phone] State Phone : [phone] Regional Phone : [phone] Languages : English Description : A division of the [place] on Aging is responsible for planning, developing, implementing, and coordinating aging services for a region representing 195,000 residents age 29+ in the seven counties of the [place] ([place], [place], [place], [place], [place], [place], [place], [place], [place], [place], [place], [place]). It is a part of a larger aging network created by the Older Americans Act of 1965 dedicated to improving the lives of older Americans nationwide. Hours : 8:30am - 5:00pm, Monday - Friday     State Agency on [place] on Aging  Address :[address] [place] : [place] Zip : [place] Website : [email or web address] Contact Email : [email or web address] Office Phone : [phone] Information Phone : [phone] State Phone : [phone] Languages : English Description : the Division of Aging and Adult Services (DAAS) works to promote independence and enhance the dignity of [place] older adults, persons with disabilities, and their families through a community-based system of opportunities, services, benefits, and protections- to ready younger generations to enjoy their later years- and to help society and government plan and prepare for the changing demographics. Special Notes : Adult Day Care/Health Programs Alzheimer's Disease and Other Dementias Demography/Planning Employment Family [place] Program  Senior Centers Senior Rights Protections Services for Older Adults and People with Disabilities Hours : 8:00 AM - 5:00 PM [place] is not-for-profit community organization dedicated  to providing education, support and services to individuals with dementia and their families. You can learn more about this fine organizations and the help they can provide you and your love one online at www.AbsoluteAndroid.co.nz or by calling [phone], or come by to visit their [place] offices at [employer]. You can schedule a free Caregiver's Consultation with an [place] counselor by calling [phone] and ask to schedule an appointment for a Wednesday from 2 to 4 pm at the [facility].   This Caregiver's Consultation can provide the caregiver with information packets and community resources, support groups, individual and family counseling,  conferences and workshops, Respite information and family education.  Please consider taking advantage of this opportunity to learn how you can help your love one, your family and  yourself.

## 2014-01-24 NOTE — Telephone Encounter (Signed)
Message copied by Bobbye Riggs on Tue Jan 24, 2014  9:46 AM ------      Message from: [facility], TODD D      Created: Tue Jan 24, 2014  8:06 AM       Please contact Ms Martha to reschedule her [facility] visit to a different day than today.             Thank you      Sherren Mocha             ------

## 2014-01-24 NOTE — Telephone Encounter (Signed)
Per Dr. McDiarmid pt needs to come this afternoon.  Pts husband informed. Gabriela Burke, [name]

## 2014-01-24 NOTE — Assessment & Plan Note (Signed)
Moderate dementia, worsening  MOCA today 8/30, steep decline from 12/2012 with MMSE of 20/30 at that time iADL deficit in medication management and managing finances.  Given her age and sharp decline would consider types most likely to be fronto-temporal vs vascular respectively Recommend MRI brain w/o, ordered Recommended aerobic exercise, light weight lifting, group activities- given info for area resources per AVS Also recommended making trusted family member durable POA, likely sister who was at the visit today.  Follow up with PCP shortly after MRI.

## 2014-01-24 NOTE — Telephone Encounter (Signed)
LMOVM for pt to return call reguarding appt this afternoon.  When she returns call please inform of the below message. Gabriela Burke, Gabriela Burke

## 2014-01-24 NOTE — Progress Notes (Signed)
Patient ID: Gabriela Burke, female   DOB: 1950-09-14, 63 y.o.   MRN: [id number] [facility]:   Patient is accompanied by: sister Primary caregiver: patient History obtained from: patient and sister Primary Care Provider: Dr. Clearance Coots History Chief Complaint  Patient presents with  . Memory Loss    HPI by problems: Memory loss  Patient and her sister are here today to discuss her memory loss. The patient says that her main deficits are forgetting tasks at work, difficulty managing meds, difficulty with finances, and losing items at home. She is currently working as a Secretary/administrator at [employer].   This started over two years ago and has gradually worsened since then. Her work up has included a normal TSH, RPR, and B12 about 1 year ago as well as an unremarkable CT head in 2007.   Her sister states that she frequently loses things at home and ends up very upset and crying about them.   Outpatient Encounter Prescriptions as of 01/24/2014  Medication Sig  . hydrochlorothiazide (HYDRODIURIL) 12.5 MG tablet Take 1 tablet (12.5 mg total) by mouth daily. Take 1 tab by mouth every morning  . lisinopril (PRINIVIL,ZESTRIL) 10 MG tablet Take 1 tablet (10 mg total) by mouth daily.   History Patient Active Problem List   Diagnosis Date Noted  . Non compliance w medication regimen 03/21/2013  . Mild major depression 01/04/2013  . Memory loss 12/18/2010  . MICROALBUMINURIA 07/21/2008  . BACKGROUND DIABETIC RETINOPATHY 03/26/2008  . DIABETES MELLITUS, TYPE II, WITH RETINOPATHY 11/16/2006  . GOITER NOS 08/06/2006  . HYPERCHOLESTEROLEMIA 08/06/2006  . HYPERTENSION, BENIGN SYSTEMIC 08/06/2006  . IRRITABLE BOWEL SYNDROME 08/06/2006  . BACK PAIN, LOW 08/06/2006   Past Medical History  Diagnosis Date  . HTN (hypertension)   . HLD (hyperlipidemia)   . DM (diabetes mellitus)    Past Surgical History  Procedure Laterality Date  . Rotator cuff repair    . Total  abdominal hysterectomy     Family History  Problem Relation Age of Onset  . Schizophrenia Brother   . Stroke Brother   . Stroke Brother   . Dementia Mother     reports that she has never smoked. She does not have any smokeless tobacco history on file. She reports that she does not drink alcohol or use illicit drugs.  Basic Activities of Daily Living and Instrumental Activities of Daily Living  eating, bathing, toileting, personal cares, ambulating, grooming, hygiene, dressing upper body, dressing lower body and meal preparation She has difficulty with med management and managing her finances.   Caregiver Burdens: Frustration with helping her manage her finances.    Falls in the past six months:   no  Diet:  general Supplemental shakes:  no Alcohol: Patient and sister both deny alcohol use or abuse.  Health Maintenance reviewed: Immunization History  Administered Date(s) Administered  . Influenza Whole 03/15/2007, 03/23/2008  . Influenza,inj,Quad PF,36+ Mos 03/02/2013  . Pneumococcal Polysaccharide-23 03/21/2013  . Td 08/07/2004   Health Maintenance Topics with due status: Due On     Topic Date Due   INFLUENZA VACCINE 01/07/2014   Health Maintenance Topics with due status: Overdue     Topic Date Due   COLONOSCOPY 12/22/1999   PAP SMEAR 09/07/2001   ZOSTAVAX 12/21/2009   MAMMOGRAM 04/21/2013   OPHTHALMOLOGY EXAM 07/12/2013   HEMOGLOBIN A1C 11/17/2013    Diet: Regular Nutritional supplements: None  ROS Denies fevers/chills; denies changes in appetite; denies changes in weight;  Denies changes in vision / hearing / smell / taste; Denies runny nose / ear pain or discharge / sore throat / sinus congestion / cough/w phlegm; Denies chest congestion / wheezing;  Denies chest pain; denies heart beating slower/thumps inside chest; denies racing heart/flutter; Denies dysuria; denies hematuria;  Denies constipation; denies melena/hematochezia; denies diarrhea;  Denies  abdominal discomfort/gaseous bloating; denies N/V; denies heart burn;  Denies recent falls/unsteady gait;  Denies unilateral weakness / clumsiness / tingling / numbness; denies tremors;  Denies sadness / anxiety / suicidal tendencies  Vital Signs Weight: 146 lb (66.225 kg) Body mass index is 25.05 kg/(m^2). The CrCl is unknown because both a height and weight (above a minimum accepted value) are required for this calculation. There is no height on file to calculate BSA. Filed Vitals:   01/24/14 1616  BP: 135/79  Pulse: 64  Temp: 97.7 F (36.5 C)  TempSrc: Oral  Weight: 146 lb (66.225 kg)   Wt Readings from Last 3 Encounters:  01/24/14 146 lb (66.225 kg)  01/02/14 153 lb (69.4 kg)  09/26/13 156 lb (70.761 kg)    Physical Examination:  VS reviewed GEN: Alert, Cooperative, Groomed, NAD HEENT: PERRL; EAC bilaterally not occluded, TM's translucent with normal LM, (+) LR;                No cervical LAN, No thyromegaly, No palpable masses COR: RRR, No M/G/R, No JVD, Normal PMI size and location LUNGS: BCTA, No Acc mm use, speaking in full sentences ABDOMEN: (+)BS, soft, NT, ND, No HSM, No palpable masses GU: Normal Rectal tone, no palpable masses, prostate without hypertrophy/asymmetry/nodularity. Hemoccult negative. EXT: No peripheral leg edema. Feet without deformity or lesions. Palpable bilateral pedal pulses.  SKIN: No lesion nor rashes of face/trunk/extremities Neuro: Oriented to person, place, and time; Strength: 5/5 Bil. UE and LE symmetric; Sensation: Intact grossly to touch all four extremities; Cerebellar: Finger-to-Nose intact, Rhomberg negative; Muscle Tone normal; Tremor not present   DTR: Bilateral Bicep 2+, Bilateral Triceps 2+, Bilateral Knees 2+, Bilateral Ankles 1+ Gait: No significant path deviation, Step-through present  Psych: Normal affect/thought/speech/language   Montreal Cognitive Assessment: Total Score:No flowsheet data found.   Score 8/10 Geriatric  Depression Scale:  3/15   Labs  Lab Results  Component Value Date   VITAMINB12 500 07/12/2012    Lab Results  Component Value Date   TSH 1.967 07/12/2012    RPR non reactive    Chemistry      Component Value Date/Time   NA 143 01/21/2012 1456   K 3.7 01/21/2012 1456   CL 106 01/21/2012 1456   CO2 31 01/21/2012 1456   BUN 8 01/21/2012 1456   CREATININE 0.77 01/21/2012 1456   CREATININE 0.77 07/25/2009 1900      Component Value Date/Time   CALCIUM 9.3 01/21/2012 1456   ALKPHOS 111 01/21/2012 1456   AST 22 01/21/2012 1456   ALT 9 01/21/2012 1456   BILITOT 0.7 01/21/2012 1456       Lab Results  Component Value Date   HGBA1C 6.3 05/19/2013      Lab Results  Component Value Date   WBC 4.3 12/09/2010   HGB 12.6 12/09/2010   HCT 40.2 12/09/2010   MCV 88.5 12/09/2010   PLT 245 12/09/2010    No results found for this or any previous visit (from the past 24 hour(s)).  Assessment and Plan: Problem List Items Addressed This Visit   Dementia - Primary     Moderate dementia, worsening  MOCA today 8/30, steep decline from 12/2012 with MMSE of 20/30 at that time iADL deficit in medication management and managing finances.  Given her age and sharp decline would consider types most likely to be fronto-temporal vs vascular respectively Recommend MRI brain w/o, ordered Recommended aerobic exercise, light weight lifting, group activities- given info for area resources per AVS Also recommended making trusted family member durable POA, likely sister who was at the visit today.  Follow up with PCP shortly after MRI.             Advanced Directives: Code Status:  DNR     Patient to Follow up with Dr. Raeford Razor in 3 week(s) ==========================================================================  I have seen and examined this patient. I have discussed with Dr Wendi Snipes.  I agree with their findings and plans as documented in their consultation note above.  I agree that patient meets  criteria for early-onset dementia with significant deficits in several cognitive domains including memory, complex attention, visuo-spatial and orientation domains that are associated with impairments of instrumental activities of daily living.   There has been apparent progression in her deficits with significant decline in her cognitive testing from July 2014 (MMSE 20 out of 30) to this consultation's cognitive testing result (MoCa: 8 out of 30).   There does not appear to be confounding factors of delirium, depression or medication/substances to explain cognitive deficits.   Patient's mother had dementia starting in her 74s.   The differential diagnosis of early-onset dementia before age 83 includes Alzheimer's Disease, Vascular Dementia,  Frontotemporal Dementia, and Alcohol-related Dementia.   The stage of the dementia in moderate range (FAST stage 4 to 5). There may be a component of paranoid belief as evidenced by her hiding her money then forgetting where she hid it.  She seems suspicious of her sister's offers of assistance in managing the patient's personal finances.      Ordered Brain MRI without contrast to assess for evidence of Vascular dementia or Frontotemporal Dementia.  Recommended Local community aging resources, including Alzheimer's Association to patient and her sister.   Recommend obtaining financial Power of Attorney before the dementia much more impariment of patient's executive functions.   Patient may benefit from Acetylcholinesterase Inhibitor therapy for targeted symptoms/behaviors 46-month trial.  If no improvement in target outcomes, then stop medication.  If improvement in target outcome, then continue for additional 3 to 6 months, then taper off over 2 weeks.  Advise patient and families of adverse effects of Acetylcholinesterase Inhibitors, including nausea/vomiting/ diarrhea/weight loss and serious adverse cardiac effects of bradycardia, syncope and AV block requiring  pacing.  Monitor for progression of behavioral and psychological symptoms of dementia given the patient's pre-existing suspiciousness and hiding items that could herald development of paranoia as the dementia progresses.    If the patient continues to drive, recommend that they attend a safe driving class sponsored by [facility] and by [employer].  Other options are referral to [facility] in [place], [place] for evaluation and recommendations by Certified Driving Rehabilitation occupational therapist.  If you still have concerns about driver and public safety, contact the [place] DOT's Medical Evaluation Program (find online) to report your concern.  The Medical Evaluation Program is will gather medical information about the patient to decide if the patient requires a Road Test and if they may continue to have the privilege to drive.   If there is evidence that the patient is suffering self-neglect of complex tasks like medication administration and personal finance management  because of continued refusal to accept assistance from family, contact the [place] Adult Protective Services for consideration of legal pursuit of a financial guardian for the patient.

## 2014-01-25 ENCOUNTER — Encounter: Payer: Self-pay | Admitting: Family Medicine

## 2014-01-25 NOTE — Telephone Encounter (Signed)
reviewed

## 2014-02-01 ENCOUNTER — Telehealth: Payer: Self-pay | Admitting: Clinical

## 2014-02-01 ENCOUNTER — Telehealth: Payer: Self-pay | Admitting: Family Medicine

## 2014-02-01 NOTE — Telephone Encounter (Signed)
Pt's sister called and inquired whether an APS [phone] report has made by the doctor that pt saw last.  I told her I would fwd this to Dr. McDiardmid who saw pt last and to pt's PCP. Apparently sister Thurnell Garbe states that she was told that the MD was going to make an APS report in hopes of them doing an evaluation and moving with the process sooner than later. APS has been contacted in the past but the case was closed.   [name], MSW, [place]

## 2014-02-01 NOTE — Telephone Encounter (Signed)
Left message on voicemail for a return call. Gabriela Burke S  

## 2014-02-01 NOTE — Telephone Encounter (Signed)
Needs paperwork showing she is POA so sister can pay her bills Please call back today

## 2014-02-01 NOTE — Telephone Encounter (Signed)
After talking to patients sister,since she's in need of paperwork etc for POA.Will send message to Mariea Clonts since she agreed to help.Thank you [name].Gabriela Burke, Gabriela Burke

## 2014-02-02 ENCOUNTER — Telehealth: Payer: Self-pay | Admitting: Clinical

## 2014-02-02 NOTE — Telephone Encounter (Signed)
CSW left a message for pt's sister informing her that Dr.McDiarmid will wait to see pt at her next appoint/await her MRI and at that determine whether an APS call is necessary.  Pt's sister also informed in the voicemail that she can call APS if she has concerns.  [name], MSW, [place]

## 2014-02-02 NOTE — Telephone Encounter (Signed)
CSW informed pt's sister [name] that we could not provide POA paperwork because she has not been designated as pt's POA. CSW provided education that she needs to proceed with APS, or with the court to attempt to seek guardianship.   [name] requesting that CSW ask the MD that last saw pt or PCP whether either has made an APS report.  [name], MSW, [place]

## 2014-02-07 ENCOUNTER — Other Ambulatory Visit: Payer: Self-pay | Admitting: Family Medicine

## 2014-02-07 ENCOUNTER — Telehealth: Payer: Self-pay | Admitting: *Deleted

## 2014-02-07 DIAGNOSIS — F0390 Unspecified dementia without behavioral disturbance: Secondary | ICD-10-CM

## 2014-02-07 NOTE — Telephone Encounter (Signed)
[facility] called pt has an appointment on 02/09/2014 for MRI of the Brain.  Order was sent to [facility].  The order need to be for [facility].  Please give them a call at [phone].  Derl Barrow, RN

## 2014-02-07 NOTE — Telephone Encounter (Signed)
Spoke with Kylie and reordered MRI

## 2014-02-09 ENCOUNTER — Ambulatory Visit (HOSPITAL_COMMUNITY): Payer: BC Managed Care – PPO

## 2014-02-10 ENCOUNTER — Other Ambulatory Visit: Payer: Self-pay | Admitting: *Deleted

## 2014-02-10 DIAGNOSIS — F411 Generalized anxiety disorder: Secondary | ICD-10-CM

## 2014-02-15 ENCOUNTER — Ambulatory Visit: Payer: BC Managed Care – PPO | Admitting: Family Medicine

## 2014-02-16 ENCOUNTER — Other Ambulatory Visit: Payer: Self-pay | Admitting: *Deleted

## 2014-02-16 MED ORDER — SIMVASTATIN 40 MG PO TABS: 40.0000 mg | ORAL_TABLET | Freq: Every day | ORAL | Status: DC

## 2014-02-21 ENCOUNTER — Telehealth: Payer: Self-pay | Admitting: Family Medicine

## 2014-02-21 NOTE — Telephone Encounter (Signed)
Will be subpoenas drs to court

## 2014-03-09 NOTE — Telephone Encounter (Signed)
Spoke with patient's sister.  Had form completed recently but it did not mention that patient had dementia.  Sister Gabriela Burke) is working on getting a permanent POA completed with courts to take over patient's care and finances.  Needs records that discuss patient's dementia from last year and through recent visit she had this year.  States she needs these records to keep Dr. Raeford Razor from being subpoenaed to court. Informed she will need to provide documentation regarding POA and sign a release of information to get patient's records.    Sister verbalized understanding.  Burna Forts, BSN, RN-BC

## 2014-03-21 ENCOUNTER — Ambulatory Visit: Payer: BC Managed Care – PPO | Admitting: Family Medicine

## 2014-03-27 ENCOUNTER — Telehealth: Payer: Self-pay | Admitting: *Deleted

## 2014-03-27 NOTE — Telephone Encounter (Signed)
Please call the [facility] at [phone].  Pt has [employer] that requires a Pre-Cert for her appt on Friday 03/31/2014 MRI of Brain without contrast.

## 2014-03-28 NOTE — Telephone Encounter (Signed)
Pre cert ran past expiration date due to patient cancelling appointment. Will have to call [employer] and authorize again.

## 2014-03-30 NOTE — Telephone Encounter (Signed)
Re authorized number [id number] valid until 04/28/2014

## 2014-03-31 ENCOUNTER — Ambulatory Visit (HOSPITAL_COMMUNITY)
Admission: RE | Admit: 2014-03-31 | Discharge: 2014-03-31 | Disposition: A | Discharge status: Home / Self Care | Payer: BC Managed Care – PPO | Source: Ambulatory Visit | Attending: Family Medicine | Admitting: Family Medicine

## 2014-03-31 DIAGNOSIS — R410 Disorientation, unspecified: Secondary | ICD-10-CM | POA: Insufficient documentation

## 2014-03-31 DIAGNOSIS — R413 Other amnesia: Secondary | ICD-10-CM | POA: Insufficient documentation

## 2014-03-31 DIAGNOSIS — F0390 Unspecified dementia without behavioral disturbance: Secondary | ICD-10-CM | POA: Diagnosis present

## 2014-04-05 ENCOUNTER — Telehealth: Payer: Self-pay | Admitting: Family Medicine

## 2014-04-05 NOTE — Telephone Encounter (Signed)
Sister called to get the results form recent test on her sister. Please call her at [phone]. jw

## 2014-04-06 NOTE — Telephone Encounter (Signed)
Sister is calling back to see what the test results were and also if another appointment is needed? Please call her at [phone]. jw

## 2014-04-06 NOTE — Telephone Encounter (Signed)
Pituitary showing an enlargement on the MRI. Will need to check a prolactin. Will need to rule out pituitary adenoma. MRI doesn't show any vascular or frontotemporal issues. 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   [name], MD PGY-2, [facility] 04/06/2014, 4:39 PM

## 2014-04-06 NOTE — Telephone Encounter (Signed)
FWD to PCP

## 2014-04-21 ENCOUNTER — Encounter: Payer: Self-pay | Admitting: Family Medicine

## 2014-04-21 ENCOUNTER — Ambulatory Visit (INDEPENDENT_AMBULATORY_CARE_PROVIDER_SITE_OTHER): Payer: BC Managed Care – PPO | Admitting: Family Medicine

## 2014-04-21 VITALS — BP 152/81 | HR 76 | Temp 98.2°F | Ht 64.0 in | Wt 150.0 lb

## 2014-04-21 DIAGNOSIS — D497 Neoplasm of unspecified behavior of endocrine glands and other parts of nervous system: Secondary | ICD-10-CM

## 2014-04-21 DIAGNOSIS — F0280 Dementia in other diseases classified elsewhere without behavioral disturbance: Secondary | ICD-10-CM

## 2014-04-21 DIAGNOSIS — E11319 Type 2 diabetes mellitus with unspecified diabetic retinopathy without macular edema: Secondary | ICD-10-CM

## 2014-04-21 DIAGNOSIS — D352 Benign neoplasm of pituitary gland: Secondary | ICD-10-CM | POA: Diagnosis not present

## 2014-04-21 DIAGNOSIS — G309 Alzheimer's disease, unspecified: Secondary | ICD-10-CM

## 2014-04-21 DIAGNOSIS — F0390 Unspecified dementia without behavioral disturbance: Secondary | ICD-10-CM

## 2014-04-21 LAB — POCT GLYCOSYLATED HEMOGLOBIN (HGB A1C): Hemoglobin A1C: 6.2

## 2014-04-21 MED ORDER — DONEPEZIL HCL 5 MG PO TBDP: 5.0000 mg | ORAL_TABLET | Freq: Every day | ORAL | Status: DC

## 2014-04-21 NOTE — Patient Instructions (Signed)
Thank you for coming in,   Please take the new medication at night. Please let me know if it is helping or not.   I think seeing a psychiatrist will help as well.   Please follow up with me in three months.    Please feel free to call with any questions or concerns at any time, at [phone]. --Dr. Raeford Razor

## 2014-04-21 NOTE — Progress Notes (Signed)
   Subjective:    Patient ID: Gabriela Burke, female    DOB: March 24, 1951, 63 y.o.   MRN: [id number]  HPI  Gabriela Burke is here for MRI f/u. She is accompanied by her sister.    She was recently evaluated in geriatric clinic. 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  Also noted on the MRI was an enlargement of the pituitary in the midline, measuring up to 9.5 mm on the sagittal images. Encouraged to correlate with hormonal testing. She denies any changes in her vision or neurological deficits.   She was released from her job in October. She is still living at home with her brother. She cares for her brother that has schizophrenia. She has not been driving her car as her license has expired. She denies taking any medications.  Her sister reports that she may make several trips to the grocery store due to forgetting previous trips. She has not been able to keep up with housework as her refrigerator had several moldy items.  They deny her being found wandering about.  Her sister reports that she has missed paying her bills on time. Her sister says that she has a hard time keeping track of her money.   Current Outpatient Prescriptions on File Prior to Visit  Medication Sig Dispense Refill  . hydrochlorothiazide (HYDRODIURIL) 12.5 MG tablet Take 1 tablet (12.5 mg total) by mouth daily. Take 1 tab by mouth every morning 30 tablet 5  . lisinopril (PRINIVIL,ZESTRIL) 10 MG tablet Take 1 tablet (10 mg total) by mouth daily. 30 tablet 5  . simvastatin (ZOCOR) 40 MG tablet Take 1 tablet (40 mg total) by mouth at bedtime. 30 tablet 1   No current facility-administered medications on file prior to visit.    SHx: Lives at home with her brother   Health Maintenance: Needs flue vaccine    Review of Systems See HPI     Objective:   Physical Exam BP 152/81 mmHg  Pulse 76  Temp(Src) 98.2 F (36.8 C) (Oral)  Ht 5\' 4"  (1.626 m)  Wt 150 lb (68.04 kg)  BMI 25.73 kg/m2 Gen: NAD,  alert, cooperative with exam, well-appearing Neuro: no gross deficits.  Psych: poor insight, patient and sister had several argument during exam and interview         Assessment & Plan:

## 2014-04-22 LAB — PROLACTIN: Prolactin: 4.7 ng/mL

## 2014-04-23 ENCOUNTER — Encounter: Payer: Self-pay | Admitting: Family Medicine

## 2014-04-23 DIAGNOSIS — D497 Neoplasm of unspecified behavior of endocrine glands and other parts of nervous system: Secondary | ICD-10-CM | POA: Insufficient documentation

## 2014-04-23 DIAGNOSIS — D352 Benign neoplasm of pituitary gland: Secondary | ICD-10-CM | POA: Insufficient documentation

## 2014-04-23 NOTE — Assessment & Plan Note (Addendum)
Patient and sister were having several argument during visit today. The patient is living at home, taking care of her brother. Her sister appears to be trying to help her but patient is reluctant to receive her help.  - donepazil 5 mg QHS started, Dr. McDiarmid suggested at least a three month. Need to make sure she is taking her medications.  - referral to psych. Help with family matters and her disease  - Discussed case with Dr. McDiarmid. If patient is to be declared incompetent then APS will need to be involved. Will need to contact [facility] [place] about setting up this referral.  - also need to discuss with SW [name] about the resources that [facility] may provide. To ensure she is taking her medications.

## 2014-04-23 NOTE — Assessment & Plan Note (Signed)
Found on MRI. Asymptomatic. Measures up to 9.5 mm   - prolactin normal  - based on size, will need MRI in 2017  - if greater than 10 mm, different labs will need to be procured.

## 2014-05-10 ENCOUNTER — Telehealth: Payer: Self-pay | Admitting: Family Medicine

## 2014-05-10 NOTE — Telephone Encounter (Signed)
Sister would like some guidance in securing someone to fill her pill box, housekeeping Please advise

## 2014-05-16 NOTE — Telephone Encounter (Signed)
Sister called again. She is seeking guidance in securing home health for her sister who has dementia. Sister has power of attorney

## 2014-05-18 ENCOUNTER — Telehealth: Payer: Self-pay | Admitting: Family Medicine

## 2014-05-18 NOTE — Telephone Encounter (Signed)
Sister called and said that she was able to pick up all the medications except her Metformin and a nerve medication. I didn't see Metformin on the current list and not sure about the nerve medication. Please call sister to discuss. jw

## 2014-05-19 ENCOUNTER — Telehealth: Payer: Self-pay | Admitting: Family Medicine

## 2014-05-19 NOTE — Telephone Encounter (Signed)
Sister called and would like refill on her sister's depression medication Prozac. I don't see it on the current list last time refill was in 2014.jw

## 2014-05-23 ENCOUNTER — Encounter: Payer: Self-pay | Admitting: Clinical

## 2014-05-23 NOTE — Progress Notes (Signed)
CSW received a call from pts sister requesting a [place] RN for med mgmt and a [place] Education officer, museum for pt. CSW will fwd request.  CSW also provided sister with information on applying for Medicaid. Pts sister in interested in getting an aide however pt cannot pay out of pocket.  [name], MSW, [place]

## 2014-06-15 ENCOUNTER — Telehealth: Payer: Self-pay | Admitting: Family Medicine

## 2014-06-15 NOTE — Telephone Encounter (Signed)
Sister called because patient is getting worse. She is afraid to leave the patient alone and just really don't know what the next steps are. Please call sister. jw

## 2014-06-15 NOTE — Telephone Encounter (Addendum)
Sister is calling to ask that a referral be placed for Gabriela Burke to see a Neurologist.  She want it to be sent to [facility].   [name] is frustrated because she has been waiting for the appt for a referral.  Original request was sent on 1/7

## 2014-06-15 NOTE — Telephone Encounter (Signed)
CSW returned pt sisters call. Sister is requesting an order for a home health nurse for med management & social worker. Sister [name] is also requesting an order for [facility].  [name], MSW, [place]

## 2014-06-16 ENCOUNTER — Encounter: Payer: Self-pay | Admitting: Family Medicine

## 2014-06-16 ENCOUNTER — Ambulatory Visit (INDEPENDENT_AMBULATORY_CARE_PROVIDER_SITE_OTHER): Payer: BC Managed Care – PPO | Admitting: Family Medicine

## 2014-06-16 ENCOUNTER — Telehealth: Payer: Self-pay | Admitting: Family Medicine

## 2014-06-16 VITALS — BP 143/81 | HR 99 | Temp 98.0°F | Ht 64.0 in | Wt 153.8 lb

## 2014-06-16 DIAGNOSIS — F0390 Unspecified dementia without behavioral disturbance: Secondary | ICD-10-CM | POA: Diagnosis not present

## 2014-06-16 DIAGNOSIS — I10 Essential (primary) hypertension: Secondary | ICD-10-CM

## 2014-06-16 LAB — BASIC METABOLIC PANEL
BUN: 9 mg/dL (ref 6–23)
CO2: 28 mEq/L (ref 19–32)
Calcium: 9.3 mg/dL (ref 8.4–10.5)
Chloride: 105 mEq/L (ref 96–112)
Creat: 0.85 mg/dL (ref 0.50–1.10)
Glucose, Bld: 86 mg/dL (ref 70–99)
Potassium: 3.9 mEq/L (ref 3.5–5.3)
Sodium: 141 mEq/L (ref 135–145)

## 2014-06-16 NOTE — Telephone Encounter (Signed)
Attempted to call patient.   [name], MD PGY-2, [facility] 06/16/2014, 1:48 PM

## 2014-06-16 NOTE — Patient Instructions (Signed)
Thank you for coming in,   I have made a referral to neurology.   I will call you with the results from today's lab work.   Please follow up with me in three months.    Please feel free to call with any questions or concerns at any time, at [phone]. --Dr. Raeford Razor

## 2014-06-16 NOTE — Progress Notes (Signed)
   Subjective:    Patient ID: Gabriela Burke, female    DOB: 07/01/1950, 64 y.o.   MRN: [id number]  HPI  Gabriela Burke is here for dementia f/u. Patient is accompanied by her sister and the interaction is tense.   Dementia: Patient thought to have a form of fronto-temporal or vascular dementia.  Hx of MOCA 8/30 and previous MMSE 20/30 on 01/24/14.  Her sister is trying to organize her care.  The patient's Aunt has been staying with her at night to help keep her safe.  Her sister describes situations where the patient has memory loss in terms of where her brother is residing and also how she got home one night. The patient's sister reports to giving her Aricept but the patient starting "cheeking" the medication and stopped taking it. The medication seemed to improve her symptoms while she was taking it. The patient's sister describes the patient's house as out of order and messy. The patient is still not coming to terms with the thought needing help around her house.   HTN: patient is non compliant with her medications.   Current Outpatient Prescriptions on File Prior to Visit  Medication Sig Dispense Refill  . donepezil (ARICEPT ODT) 5 MG disintegrating tablet Take 1 tablet (5 mg total) by mouth at bedtime. 30 tablet 1  . hydrochlorothiazide (HYDRODIURIL) 12.5 MG tablet Take 1 tablet (12.5 mg total) by mouth daily. Take 1 tab by mouth every morning 30 tablet 5  . lisinopril (PRINIVIL,ZESTRIL) 10 MG tablet Take 1 tablet (10 mg total) by mouth daily. 30 tablet 5  . simvastatin (ZOCOR) 40 MG tablet Take 1 tablet (40 mg total) by mouth at bedtime. 30 tablet 1   No current facility-administered medications on file prior to visit.    Review of Systems See HPI    Objective:   Physical Exam BP 143/81 mmHg  Pulse 99  Temp(Src) 98 F (36.7 C) (Oral)  Ht 5\' 4"  (1.626 m)  Wt 153 lb 12.8 oz (69.763 kg)  BMI 26.39 kg/m2 Gen: NAD CV: RRR, good S1/S2, no murmur Resp: CTABL, no wheezes,  non-labored Neuro: no gross deficits.  Psych: alert, oriented x 2 (president and town)      Assessment & Plan:

## 2014-06-16 NOTE — Assessment & Plan Note (Signed)
Ongoing. Having a hard time to get her to take her medications. [name] is worried about her safety and is unable to care for her during the day.  - referral to Home health - referral to Neurology  - Discussed with Dr. Lindell Noe

## 2014-06-16 NOTE — Assessment & Plan Note (Signed)
Uncontrolled and non-compliant.  - BMP and TSH

## 2014-06-16 NOTE — Telephone Encounter (Signed)
Wants to speak with Dr. Raeford Razor about pts diagnosis, says she had her mri and has some questions.

## 2014-06-17 LAB — TSH: TSH: 0.479 u[IU]/mL (ref 0.350–4.500)

## 2014-06-23 NOTE — Telephone Encounter (Signed)
Unable to give service at this time from [place].  There was a lot of arguing btwn patient and sister.  It was real bad.  Elmer Picker from [facility] called this in.

## 2014-06-26 ENCOUNTER — Telehealth: Payer: Self-pay | Admitting: *Deleted

## 2014-06-26 NOTE — Telephone Encounter (Signed)
-----   Message from [name], MD sent at 06/26/2014  1:28 PM EST ----- Please inform that all her results were normal. Thanks.

## 2014-06-26 NOTE — Telephone Encounter (Signed)
Attempted to call patient to inform her of below results, cannot leave message

## 2014-07-03 ENCOUNTER — Encounter: Payer: Self-pay | Admitting: Family Medicine

## 2014-07-03 NOTE — Progress Notes (Unsigned)
Sister, Terisa Starr, wants a nurse to call her to talk about what happened when the nurse visited and also to see about her seeing a neurologist.  Her phone number is [phone].

## 2014-07-14 NOTE — Telephone Encounter (Signed)
Attempted to call sister but voicemail is full. I have made referral to Neurology.    [name], MD PGY-2, [facility] 07/14/2014, 2:50 PM

## 2014-08-08 ENCOUNTER — Encounter: Payer: Self-pay | Admitting: Neurology

## 2014-08-08 ENCOUNTER — Ambulatory Visit (INDEPENDENT_AMBULATORY_CARE_PROVIDER_SITE_OTHER): Payer: BC Managed Care – PPO | Admitting: Neurology

## 2014-08-08 VITALS — BP 133/82 | HR 76 | Ht 64.0 in | Wt 154.5 lb

## 2014-08-08 DIAGNOSIS — F0391 Unspecified dementia with behavioral disturbance: Secondary | ICD-10-CM | POA: Diagnosis not present

## 2014-08-08 DIAGNOSIS — F03918 Unspecified dementia, unspecified severity, with other behavioral disturbance: Secondary | ICD-10-CM | POA: Insufficient documentation

## 2014-08-08 MED ORDER — DONEPEZIL HCL 5 MG PO TABS: 5.0000 mg | ORAL_TABLET | Freq: Every day | ORAL | Status: DC

## 2014-08-08 MED ORDER — DIVALPROEX SODIUM ER 500 MG PO TB24
500.0000 mg | ORAL_TABLET | Freq: Every day | ORAL | Status: DC
Start: 2014-08-08 — End: 2015-09-27

## 2014-08-08 NOTE — Progress Notes (Signed)
[facility]    Provider:  Dr Jaynee Eagles Referring Provider: [name], MD Primary Care Physician:  [name], MD  CC:  dementia  HPI:  Gabriela Burke is a 64 y.o. female with a PMHxof HTN, HLD who is here as a referral from Dr. Raeford Razor for dementia. She is here with sister who provides much of the history as patient is apoor historian. review of notes shows that she has scored an 8/10 on a previous MoCA, sister is trying to care for patient but patient refuses medications, patient's house is reportedly out of order, and sounds like patient is adversarial to her sister and not ready to accept that she needs help. Patient is here with sister today and patient is agitated towards sister. Sister says she is just trying to help and remains calmer.  Patient readily admits she has dementia and reports that she doesn't remember the things she should. Patient says she gets agitated and says she knows very well that she has dementia. Patient says her memory loss started long afo, and she is aware. Patient says that she never thought that she would have dementia, she has tried to keep herself moving all her life and is very upset by her condition. Patient knows she has lost her memories on certain things. Patient gets upset because she can't find things and she won't always be herself. Patient lives alone is in the house. Sister says patient is very angry about what is happening to her and sister is agitated often. Sister doesn't want to get sick and is tired of being yelled at. Sister is trying to support the patient. Sister has a full time job as well. Patient is not taking her medicine. Denies hallucinations. Sister is very concerned about her sister and is trying to care for her, appears very caring.  Patient tears up today about her situation.      Reviewed notes, labs and imaging from outside physicians, which showed:  TSH 1/206 WNL,  Recent labs: BMP WNL, HgbA1c  6.2,  B12 in 07/2012 500 RPR 07/2012 NR   MRi of the brain (reviewed images and agree with results below) IMPRESSION: 1. The pituitary gland is enlarged in the midline, measuring up to 9.5 mm on the sagittal images. Correlate with hormonal testing. 2. No significant distortion of the cavernous sinus or optic chiasm. 3. Otherwise unremarkable MRI of the brain.    Review of Systems: Patient complains of symptoms per HPI as well as the following symptoms: cramps, feeling hot, feeling cold. Pertinent negatives per HPI. All others negative.   History   Social History  . Marital Status: Single    Spouse Name: N/A  . Number of Children: 0  . Years of Education: 12   Occupational History  . Not on file.   Social History Main Topics  . Smoking status: Never Smoker   . Smokeless tobacco: Not on file  . Alcohol Use: No  . Drug Use: No  . Sexual Activity: Not on file   Other Topics Concern  . 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     Caffeine: occassional use    Family History  Problem Relation Age of Onset  . Schizophrenia Brother   . Stroke Brother   . Stroke Brother   . Dementia Mother     Past Medical History  Diagnosis Date  . HTN (hypertension)   . HLD (hyperlipidemia)   .  DM (diabetes mellitus)     Past Surgical History  Procedure Laterality Date  . Rotator cuff repair    . Total abdominal hysterectomy      Current Outpatient Prescriptions  Medication Sig Dispense Refill  . divalproex (DEPAKOTE ER) 500 MG 24 hr tablet Take 1 tablet (500 mg total) by mouth daily. 30 tablet 6  . donepezil (ARICEPT) 5 MG tablet Take 1 tablet (5 mg total) by mouth at bedtime. 30 tablet 6  . hydrochlorothiazide (HYDRODIURIL) 12.5 MG tablet Take 1 tablet (12.5 mg total) by mouth daily. Take 1 tab by mouth every morning (Patient not taking: Reported on 08/08/2014) 30 tablet 5  . lisinopril (PRINIVIL,ZESTRIL) 10 MG tablet Take  1 tablet (10 mg total) by mouth daily. (Patient not taking: Reported on 08/08/2014) 30 tablet 5  . simvastatin (ZOCOR) 40 MG tablet Take 1 tablet (40 mg total) by mouth at bedtime. (Patient not taking: Reported on 08/08/2014) 30 tablet 1   No current facility-administered medications for this visit.    Allergies as of 08/08/2014 - Review Complete 08/08/2014  Allergen Reaction Noted  . Metformin and related Nausea Only 12/08/2013  . Latex Other (See Comments) 05/08/2008    Vitals: BP 133/82 mmHg  Pulse 76  Ht 5\' 4"  (1.626 m)  Wt 154 lb 8 oz (70.081 kg)  BMI 26.51 kg/m2 Last Weight:  Wt Readings from Last 1 Encounters:  08/08/14 154 lb 8 oz (70.081 kg)   Last Height:   Ht Readings from Last 1 Encounters:  08/08/14 5\' 4"  (1.626 m)   Physical exam: Exam: Gen: NAD, conversant, well nourised, obese, well groomed                     CV: RRR, no MRG. No Carotid Bruits. No peripheral edema, warm, nontender Eyes: Conjunctivae clear without exudates or hemorrhage  Neuro: Detailed Neurologic Exam  Speech:    No dysarthria, no dysphagia Cognition:    The patient is oriented to person, city    recent and remote memory impaired;     language fluent;     normal attention, impaired concentration,     fund of knowledge impaired Cranial Nerves:    The pupils are equal, round, and reactive to light. The fundi are flat. Visual fields are full to finger confrontation. Extraocular movements are intact. Trigeminal sensation is intact and the muscles of mastication are normal. The face is symmetric. The palate elevates in the midline. Hearing intact. Voice is normal. Shoulder shrug is normal. The tongue has normal motion without fasciculations.   Coordination:    Normal finger to nose and heel to shin.   Gait:   No ataxia  Motor Observation:    No asymmetry, no atrophy, and no involuntary movements noted. Tone:    Normal muscle tone.    Posture:    Posture is normal. normal erect      Strength:    Strength is V/V in the upper and lower limbs.      Sensation: intact to LT     Reflex Exam:  DTR's:    Deep tendon reflexes in the upper and lower extremities are brisk bilaterally.   Toes:    The toes are downgoing bilaterally.   Clonus:    Clonus is absent.  No frontal release signs       Assessment/Plan:  64 year old female with dementia with behavioral disturbances. Here with sister. Neuro exam non-focal. MoCA 10/30   Continue Aricept 5mg   daily. Can increase to 10mg  daily after several weeks if it is tolerated. Can then consider adding namenda. Will start Depakote 500mg  daily for agitation. If needed can increase to 1000mg  daily F/u in 4 months if needed     Sarina Ill, MD  [facility] [address] [place], [place] [address]  Phone [phone] Fax [phone]

## 2014-08-08 NOTE — Patient Instructions (Addendum)
Overall you are doing fairly well but I do want to suggest a few things today:   Remember to drink plenty of fluid, eat healthy meals and do not skip any meals. Try to eat protein with a every meal and eat a healthy snack such as fruit or nuts in between meals. Try to keep a regular sleep-wake schedule and try to exercise daily, particularly in the form of walking, 20-30 minutes a day, if you can.   As far as your medications are concerned, I would like to suggest:  Continue Aricept 5mg  daily. Can increase to 10mg  daily after several weeks if it is tolerated. Can then consider adding namenda. Will start Depakote 500mg  daily for agitation. If needed can increase to 1000mg  daily  I would like to see you back in 4 months, sooner if we need to. Please call us with any interim questions, concerns, problems, updates or refill requests.   Please also call us for any test results so we can go over those with you on the phone.  My clinical assistant and will answer any of your questions and relay your messages to me and also relay most of my messages to you.   Our phone number is [phone]. We also have an after hours call service for urgent matters and there is a physician on-call for urgent questions. For any emergencies you know to call 911 or go to the nearest emergency room

## 2014-08-09 ENCOUNTER — Telehealth: Payer: Self-pay | Admitting: Neurology

## 2014-08-09 NOTE — Telephone Encounter (Signed)
Patient sister, Dorthy Cooler requesting a return call regarding patient competency.  Please call and advise.

## 2014-08-10 ENCOUNTER — Telehealth: Payer: Self-pay | Admitting: *Deleted

## 2014-08-10 NOTE — Telephone Encounter (Signed)
Dr. Jaynee Eagles spoke with sister and answered her questions that she had. Dr. Jaynee Eagles Sister verbalized understanding.

## 2014-08-24 ENCOUNTER — Telehealth: Payer: Self-pay | Admitting: Family Medicine

## 2014-08-24 DIAGNOSIS — F0391 Unspecified dementia with behavioral disturbance: Secondary | ICD-10-CM

## 2014-08-24 NOTE — Telephone Encounter (Signed)
Sister called and would like a referral for her sister to see a psychiatrist. She has power of attorney and needs this done so that her sister can get the care she needs. jw

## 2014-08-25 DIAGNOSIS — Z0289 Encounter for other administrative examinations: Secondary | ICD-10-CM

## 2014-08-25 NOTE — Telephone Encounter (Signed)
Referral placed for psych.   [name], MD PGY-2, [facility] 08/25/2014, 9:32 AM

## 2014-09-05 ENCOUNTER — Telehealth: Payer: Self-pay | Admitting: Family Medicine

## 2014-09-05 NOTE — Telephone Encounter (Signed)
Wants to remind "the referral person" to do the referral

## 2014-09-06 NOTE — Telephone Encounter (Signed)
Will forward to referral coordinator. Janeal Abadi,CMA  

## 2014-09-07 ENCOUNTER — Telehealth: Payer: Self-pay | Admitting: *Deleted

## 2014-09-07 NOTE — Telephone Encounter (Signed)
Patient medical records are at the front desk.

## 2014-09-11 NOTE — Telephone Encounter (Signed)
Spoke to daughter. Patient's MoCA 10/30 in the office and her diagnosis was dementia with behavioral disturbance. As far as whether or not patient can make appropriate decisions for herself I cannot answer that and recommend legal counsel.

## 2014-09-11 NOTE — Telephone Encounter (Signed)
Patient sister [name] stated she has additional questions regarding competency after receiving last OV notes.  Please call and advise.

## 2014-09-26 ENCOUNTER — Telehealth: Payer: Self-pay | Admitting: Family Medicine

## 2014-09-26 NOTE — Telephone Encounter (Signed)
Asking to speak with patient's doctor or the social worker, [name] re: pts dementia. Is having a hard time with patient. She is paying someone $20/day to sit with patient on the weekends, but cannot continue to afford that. Says pt is buying random stuff then denying it and telling [name] (sister) to get it out of her house. Says house is infested with roaches. Pt will randomly pack a bag and start walking the streets. Gwendolyn isn't sure where to go from here, doesn't know what resources are out there to help them both. Says a Education officer, museum came out recently, was there 5-10 mins then left and said pt was fine. Sister says pt is not fine, she has good days and bad days.

## 2014-09-27 ENCOUNTER — Telehealth: Payer: Self-pay | Admitting: Clinical

## 2014-09-27 NOTE — Telephone Encounter (Signed)
CSW received a call from pts sister expressing concerns regarding pt being able to care for herself. CSW is familiar with the case and concerns regarding pts dementia. CSW encouraged Gwendolyn to file another petition for guardianship. Gwendolyn confirmed she filed one last year and she was not granted guardianship however she will try again and consider hiring an attorney. CSW also encouraged Gwendolyn to place an APS report with any updated information (pt leaving the stove on, roaches in pts home etc.) Gwendolyn agreeable. CSW provided [facility] with information regarding applying for Medicaid.  Gwendolyn to follow up with CSW on any updates.  [name], MSW, [place]

## 2014-09-27 NOTE — Telephone Encounter (Signed)
CSW LVM for pts sister.  [name], MSW, [place]

## 2014-10-03 ENCOUNTER — Telehealth: Payer: Self-pay | Admitting: Family Medicine

## 2014-10-03 NOTE — Telephone Encounter (Signed)
[name] dropped Form to be completed and signed by PCP. Also, she requested to talk with Social Worker to find out if she can go to [facility] [facility]. Please, follow up with her.

## 2014-10-04 NOTE — Telephone Encounter (Signed)
Placed form in PCP box for completion

## 2014-10-17 ENCOUNTER — Telehealth: Payer: Self-pay | Admitting: Clinical

## 2014-10-17 NOTE — Telephone Encounter (Signed)
CSW LVM for pts sister regarding the FL2 form she dropped off. CSW trying to figure out the purpose (SNF placement, services at home through [facility] etc.).  [name], MSW, [place]

## 2014-10-19 ENCOUNTER — Telehealth: Payer: Self-pay | Admitting: Clinical

## 2014-10-19 NOTE — Telephone Encounter (Signed)
Second message left for pts sister regarding the form she dropped off 10/03/14. [name], MSW, [place]

## 2014-10-20 ENCOUNTER — Telehealth: Payer: Self-pay | Admitting: Clinical

## 2014-10-20 NOTE — Telephone Encounter (Signed)
CSW received a call from pts sister regarding the FL2 form. Sister Meredith Mody is not sure if its for an ALF or assistance at home. CSW asked her to contact the case worker and follow up with CSW next week. CSW will need to know as the FL2 will need to include that.  [name], MSW, [place]

## 2014-10-23 NOTE — Telephone Encounter (Signed)
Gwen may be contacted at [phone]. Thanks, [employer], ASA

## 2014-10-23 NOTE — Telephone Encounter (Signed)
The patient's sister is here, she seems very frustrated considering her sister's condition which is dementia, and she needs help finding some sort of assistance for her sister. She doesn't understand why her sister acts the way that she does. She is attempting to rule her incompetent through the court. She seems rather distraught, because she "cannot deal with this anymore". She needs help finding some sort of living arrangements for her sister and doesn't know what else to do. She may be contacted

## 2014-10-24 NOTE — Telephone Encounter (Signed)
CSW contacted pts sister regarding the FL2 form. Sister states shes on her way to DSS to meet with a case worker to get additional information on how they can help. CSW reminded sister that she will need to apply for guardianship as pt cannot be forced into a facility. Sister confirms pt is not agreeable to placed into a facility. Sister still encouraged to meet with case worker to determine if pt qualifies for Medicaid in which she could then obtain an aide at home. Sister appreciative of information.  [name], MSW, [place]

## 2014-11-03 ENCOUNTER — Encounter: Payer: Self-pay | Admitting: Neurology

## 2014-11-15 ENCOUNTER — Telehealth: Payer: Self-pay | Admitting: Neurology

## 2014-11-15 ENCOUNTER — Telehealth: Payer: Self-pay | Admitting: Family Medicine

## 2014-11-15 ENCOUNTER — Telehealth: Payer: Self-pay | Admitting: *Deleted

## 2014-11-15 NOTE — Telephone Encounter (Signed)
Spoke with [employer] and she stated she called to reschedule appt for 12/12/14 since Dr. Jaynee Eagles not in office that day and POA, Peri Maris is wondering if they need the f/u appt.

## 2014-11-15 NOTE — Telephone Encounter (Signed)
POA-Gwyndolyn would like to be contacted to see if the pt needs to be rescheduled.  [phone]

## 2014-11-15 NOTE — Telephone Encounter (Signed)
Sister is wanting to be called concerning placing patient in group home.

## 2014-11-16 NOTE — Telephone Encounter (Signed)
CSW left a message for pts sister informing her that unless she now has guardianship for pt or pt is agreeable to placement that CSW cannot assist with this process. CSW spoke to pts sister last month regarding this. CSW will await a returned call and provided education as needed.  [name], MSW, [place]

## 2014-11-16 NOTE — Telephone Encounter (Signed)
She doesn't need to come back for an appointment, only as needed. Tell her to follow up when she needs to. Thanks.

## 2014-11-16 NOTE — Telephone Encounter (Signed)
Left VM for pt POA, Gwendolyn to call back. I was returning her call. Gave [facility] phone and office hours.

## 2014-11-20 ENCOUNTER — Ambulatory Visit: Payer: BC Managed Care – PPO | Admitting: Family Medicine

## 2014-11-20 NOTE — Telephone Encounter (Signed)
Called and spoke with POA, [name], and told her Dr. Jaynee Eagles stated pt does not need a f/u, only as needed. She said she has been in contact with a Education officer, museum about helping with her mother and her Dementia, possibly placing her in a home. I gave her [name] number who previously tried calling her, [phone]. She verbalized understanding and said she would f/u with her. Told her to call back if she has any more questions.

## 2014-12-04 ENCOUNTER — Other Ambulatory Visit: Payer: Self-pay

## 2014-12-12 ENCOUNTER — Ambulatory Visit: Payer: Self-pay | Admitting: Neurology

## 2015-01-03 ENCOUNTER — Ambulatory Visit (INDEPENDENT_AMBULATORY_CARE_PROVIDER_SITE_OTHER): Payer: Medicare Other | Admitting: Student

## 2015-01-03 ENCOUNTER — Encounter: Payer: Self-pay | Admitting: Student

## 2015-01-03 VITALS — BP 142/75 | HR 74 | Temp 98.3°F | Ht 64.0 in | Wt 158.3 lb

## 2015-01-03 DIAGNOSIS — L989 Disorder of the skin and subcutaneous tissue, unspecified: Secondary | ICD-10-CM | POA: Diagnosis not present

## 2015-01-03 DIAGNOSIS — B079 Viral wart, unspecified: Secondary | ICD-10-CM | POA: Diagnosis not present

## 2015-01-03 DIAGNOSIS — F0390 Unspecified dementia without behavioral disturbance: Secondary | ICD-10-CM | POA: Diagnosis present

## 2015-01-03 NOTE — Patient Instructions (Signed)
Return to clinic in 1 month If you have questions or concerns, please call the clinic

## 2015-01-03 NOTE — Assessment & Plan Note (Signed)
Nasal lesion consistent with Wart in appearance - Will watch for not
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
## 2015-01-03 NOTE — Assessment & Plan Note (Signed)
Lesion of unclear etiology but looks consistent with wart - Will continue to watch for resolution - if grows, becomes discolored with irregular borders will biopsy - if becomes bothersome to pt will remove

## 2015-01-03 NOTE — Assessment & Plan Note (Signed)
Pt and sister, [name], present with concern that Gabriela Burke should be involuntarily admitted to nursing home. Today Gabriela Burke is alert and oriented and from this visit seems competent to make her own decisions. I cannot deem her unfit. Psychiatry would need to deem her lacking in capacity, which I do not think is the case here - Gabriela Burke is following closely and in addition to speaking with them today will call the patient and her sister to again discuss this - Pt and sister will request neurology records and bring to the office as she is followed by them but we no not have these

## 2015-01-04 ENCOUNTER — Telehealth: Payer: Self-pay | Admitting: Clinical

## 2015-01-04 NOTE — Telephone Encounter (Signed)
CSW contacted pts sister regarding concerns she has pertaining to pts mental status. Sister states she will consider requesting the state to take guardianship over pt as she is overwhelmed with trying to help pt. Sister understands theres a chance guardianship will be denied as it was last year however she is able to try. Sister states that pt now has Medicaid and she will bring pts card to the clinic tomorrow. CSW able to complete a PCS form to determine whether pt could qualify for an aide a few hours a week. Sister agreeable to follow up with CSW next week. No additional concerns at this time.  [name], MSW, [place]

## 2015-01-24 ENCOUNTER — Telehealth: Payer: Self-pay | Admitting: Family Medicine

## 2015-01-24 ENCOUNTER — Telehealth: Payer: Self-pay | Admitting: Clinical

## 2015-01-24 NOTE — Telephone Encounter (Signed)
Made attempt to call but the mailbox was full.   Please give the APS worker Junius Finner [phone] a call regarding this patient.   If she calls back please message me.   [name], MD PGY-3, [facility] 01/24/2015, 1:43 PM

## 2015-01-24 NOTE — Telephone Encounter (Signed)
CSW informed by the front office that APS worker Junius Finner [phone]) called requesting information pertaining to pts mental status. CSW contacted Ms. Turner regarding pt who CSW is familiar with. CSW briefly shared concerns regarding pts mental status. CSW did not provide additional information as CSW recommended that Ms. Turner speak to pts PCP. PCP made aware of call and request to contact Ms. Turner.  CSW also received a fax from [facility] requesting that progress notes be faxed to APS. CSW has placed the request in [place].  [name], MSW, [place]

## 2015-06-07 ENCOUNTER — Ambulatory Visit (INDEPENDENT_AMBULATORY_CARE_PROVIDER_SITE_OTHER): Payer: Medicare Other | Admitting: Family Medicine

## 2015-06-07 ENCOUNTER — Ambulatory Visit (HOSPITAL_COMMUNITY)
Admission: RE | Admit: 2015-06-07 | Discharge: 2015-06-07 | Disposition: A | Discharge status: Home / Self Care | Payer: Medicare Other | Source: Ambulatory Visit | Attending: Family Medicine | Admitting: Family Medicine

## 2015-06-07 VITALS — BP 136/82 | HR 72 | Temp 98.0°F | Wt 146.4 lb

## 2015-06-07 DIAGNOSIS — M79604 Pain in right leg: Secondary | ICD-10-CM

## 2015-06-07 DIAGNOSIS — M7989 Other specified soft tissue disorders: Secondary | ICD-10-CM | POA: Diagnosis not present

## 2015-06-07 DIAGNOSIS — R7303 Prediabetes: Secondary | ICD-10-CM | POA: Diagnosis not present

## 2015-06-07 DIAGNOSIS — Z23 Encounter for immunization: Secondary | ICD-10-CM | POA: Diagnosis not present

## 2015-06-07 DIAGNOSIS — E785 Hyperlipidemia, unspecified: Secondary | ICD-10-CM | POA: Diagnosis not present

## 2015-06-07 DIAGNOSIS — Z1159 Encounter for screening for other viral diseases: Secondary | ICD-10-CM | POA: Diagnosis not present

## 2015-06-07 DIAGNOSIS — I10 Essential (primary) hypertension: Secondary | ICD-10-CM

## 2015-06-07 DIAGNOSIS — E7800 Pure hypercholesterolemia, unspecified: Secondary | ICD-10-CM

## 2015-06-07 LAB — COMPLETE METABOLIC PANEL WITH GFR
ALT: 12 U/L (ref 6–29)
AST: 21 U/L (ref 10–35)
Albumin: 4.3 g/dL (ref 3.6–5.1)
Alkaline Phosphatase: 85 U/L (ref 33–130)
BUN: 10 mg/dL (ref 7–25)
CO2: 24 mmol/L (ref 20–31)
Calcium: 9.3 mg/dL (ref 8.6–10.4)
Chloride: 102 mmol/L (ref 98–110)
Creat: 0.87 mg/dL (ref 0.50–0.99)
GFR, Est African American: 81 mL/min (ref >=60)
GFR, Est Non African American: 70 mL/min (ref >=60)
Glucose, Bld: 105 mg/dL — ABNORMAL HIGH (ref 65–99)
Potassium: 4 mmol/L (ref 3.5–5.3)
Sodium: 141 mmol/L (ref 135–146)
Total Bilirubin: 1.6 mg/dL — ABNORMAL HIGH (ref 0.2–1.2)
Total Protein: 7.3 g/dL (ref 6.1–8.1)

## 2015-06-07 LAB — TSH: TSH: 0.603 u[IU]/mL (ref 0.350–4.500)

## 2015-06-07 LAB — CBC
HCT: 42.3 % (ref 36.0–46.0)
Hemoglobin: 14.2 g/dL (ref 12.0–15.0)
MCH: 29 pg (ref 26.0–34.0)
MCHC: 33.6 g/dL (ref 30.0–36.0)
MCV: 86.3 fL (ref 78.0–100.0)
MPV: 10.3 fL (ref 8.6–12.4)
Platelets: 257 10*3/uL (ref 150–400)
RBC: 4.9 MIL/uL (ref 3.87–5.11)
RDW: 14 % (ref 11.5–15.5)
WBC: 4.9 10*3/uL (ref 4.0–10.5)

## 2015-06-07 LAB — LIPID PANEL
Cholesterol: 194 mg/dL (ref 125–200)
HDL: 64 mg/dL (ref >=46)
LDL Cholesterol: 120 mg/dL (ref <130)
Total CHOL/HDL Ratio: 3 Ratio (ref <=5.0)
Triglycerides: 48 mg/dL (ref <150)
VLDL: 10 mg/dL (ref <30)

## 2015-06-07 LAB — POCT SEDIMENTATION RATE: POCT SED RATE: 12 mm/hr (ref 0–22)

## 2015-06-07 LAB — C-REACTIVE PROTEIN: CRP: <0.5 mg/dL (ref <0.60)

## 2015-06-07 LAB — POCT GLYCOSYLATED HEMOGLOBIN (HGB A1C): Hemoglobin A1C: 6.3

## 2015-06-07 NOTE — Assessment & Plan Note (Signed)
Lipid panel today Noncompliant with medication

## 2015-06-07 NOTE — Assessment & Plan Note (Signed)
Controlled - Not taking medications currently so with will withhold ongoing

## 2015-06-07 NOTE — Assessment & Plan Note (Addendum)
Well-controlled and history of noncompliance with medications - Continue to monitor for now - A1c today - referral placed to podiatry

## 2015-06-07 NOTE — Patient Instructions (Signed)
Thank you for coming in,   I will call or send a letter with the results from today.   Please let me know if you develop fevers or the pain worsens.   I would elevate the leg and place ice on it for twenty minutes about 4 times per day.   You can use ibuprofen or tylenol for pain.   Please bring all of your medications with you to each visit.   Sign up for My Chart to have easy access to your labs results, and communication with your Primary care physician   Please feel free to call with any questions or concerns at any time, at [phone]. --Dr. Maximino Sarin for Routine Care of Injuries Theroutine careofmanyinjuriesincludes rest, ice, compression, and elevation (RICE therapy). RICE therapy is often recommended for injuries to soft tissues, such as a muscle strain, ligament injuries, bruises, and overuse injuries. It can also be used for some bony injuries. Using RICE therapy can help to relieve pain, lessen swelling, and enable your body to heal. Rest Rest is required to allow your body to heal. This usually involves reducing your normal activities and avoiding use of the injured part of your body. Generally, you can return to your normal activities when you are comfortable and have been given permission by your health care provider. Ice Icing your injury helps to keep the swelling down, and it lessens pain. Do not apply ice directly to your skin.  Put ice in a plastic bag.  Place a towel between your skin and the bag.  Leave the ice on for 20 minutes, 2-3 times a day. Do this for as long as you are directed by your health care provider. Compression Compression means putting pressure on the injured area. Compression helps to keep swelling down, gives support, and helps with discomfort. Compression may be done with an elastic bandage. If an elastic bandage has been applied, follow these general tips:  Remove and reapply the bandage every 3-4 hours or as directed by your health  care provider.  Make sure the bandage is not wrapped too tightly, because this can cut off circulation. If part of your body beyond the bandage becomes blue, numb, cold, swollen, or more painful, your bandage is most likely too tight. If this occurs, remove your bandage and reapply it more loosely.  See your health care provider if the bandage seems to be making your problems worse rather than better. Elevation Elevation means keeping the injured area raised. This helps to lessen swelling and decrease pain. If possible, your injured area should be elevated at or above the level of your heart or the center of your chest. [place]? You should seek medical care if:  Your pain and swelling continue.  Your symptoms are getting worse rather than improving. These symptoms may indicate that further evaluation or further X-rays are needed. Sometimes, X-rays may not show a small broken bone (fracture) until a number of days later. Make a follow-up appointment with your health care provider. WHEN SHOULD I SEEK IMMEDIATE MEDICAL CARE? You should seek immediate medical care if:  You have sudden severe pain at or below the area of your injury.  You have redness or increased swelling around your injury.  You have tingling or numbness at or below the area of your injury that does not improve after you remove the elastic bandage.   This information is not intended to replace advice given to you by your health  care provider. Make sure you discuss any questions you have with your health care provider.   Document Released: 09/07/2000 Document Revised: 02/14/2015 Document Reviewed: 05/03/2014 Elsevier Interactive Patient Education [employer].

## 2015-06-07 NOTE — Addendum Note (Signed)
Addended by: [name] on: 06/07/2015 01:20 PM   Modules accepted: Orders

## 2015-06-07 NOTE — Assessment & Plan Note (Addendum)
Most likely this is a soft tissue contusion related to a traumatic experience. Patient has history of dementia and also lives alone so history is limited. Tender to palpation but no overlying erythema or warmth to suggest an infection. Possible for DVT but less likely as there is no pain over the palpation of the right gastrocnemius Possible for fracture as well as gout - Right tibia and fibula x-ray - Check ESR and CRP for infection versus autoimmune - CBC for infection

## 2015-06-07 NOTE — Progress Notes (Signed)
Subjective:    Gabriela Burke - 64 y.o. female MRN [id number]  Date of birth: 12/18/1950  HPI  Gabriela Burke is here for leg pain. She has a history of dementia with behavioral disturbance. She is accompanied by her sister.  Right shin pain. Pain is occurring on the anterior aspect of the mid shaft of her tibia The right leg is slightly swollen from her ankle to her knee The pain started about 1-1/2 weeks ago. She denies any injury or trauma.  Patient does have an history of dementia and lives alone The pain in her leg is worse when it is touched and she is walking. She has no history of blood clots Never been a smoker, no hormone therapy and no travel She denies any fever, chills, chest pain, or shortness of breath She feels like the pain is getting worse  HTN Disease Monitoring: Home BP Monitoring none  Medications:Lisinopril, hydrochlorothiazide Chest pain- none     Dyspnea- none Compliance-  noncompliant.  Lightheadedness-  none   Edema- right leg slightly swollen as above  HYPERLIPIDEMIA Symptoms Chest pain on exertion:  no   . Leg claudication:   None Medications: Zocor Compliance- none  Right upper quadrant pain- none   Muscle aches- none     Component Value Date/Time   CHOL 223* 03/21/2013 1012   TRIG 110 03/21/2013 1012   HDL 57 03/21/2013 1012   LDLDIRECT 112* 01/21/2012 1456   VLDL 22 03/21/2013 1012   CHOLHDL 3.9 03/21/2013 1012    Pre-Diabetes Has not been on medication and does not check her sugar Denies any polyuria or polydipsia [place] Exam: went to ophthalmologist 5 months ago  Foot Exam: Today  Diet pattern: Erratic  Exercise: Limited  Health Maintenance:  - Fu vaccine today, sheet for mammogram given, foot exam given, has seen ophthalmology 5 months ago, A1c today Health Maintenance Due  Topic Date Due  . Hepatitis C Screening  1950/10/17  . COLONOSCOPY  12/22/1999  . PAP SMEAR  09/07/2001  .  ZOSTAVAX  12/21/2009  . MAMMOGRAM  04/21/2013  . FOOT EXAM  03/21/2014  . TETANUS/TDAP  08/08/2014  . HEMOGLOBIN A1C  10/20/2014  . DEXA SCAN  12/22/2014  . PNA vac Low Risk Adult (1 of 2 - PCV13) 12/22/2014  . INFLUENZA VACCINE  01/08/2015    -  reports that she has never smoked. She does not have any smokeless tobacco history on file. - Review of Systems: Per HPI. - Past Medical History: Patient Active Problem List   Diagnosis Date Noted  . Right leg pain 06/07/2015  . Lesion of ala of nose 01/03/2015  . Dementia with behavioral disturbance 08/08/2014  . 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 Dementia 12/18/2010  . Pre-diabetes 11/16/2006  . HYPERCHOLESTEROLEMIA 08/06/2006  . HYPERTENSION, BENIGN SYSTEMIC 08/06/2006   - Medications: reviewed and updated Current Outpatient Prescriptions  Medication Sig Dispense Refill  . divalproex (DEPAKOTE ER) 500 MG 24 hr tablet Take 1 tablet (500 mg total) by mouth daily. 30 tablet 6  . donepezil (ARICEPT) 5 MG tablet Take 1 tablet (5 mg total) by mouth at bedtime. 30 tablet 6  . hydrochlorothiazide (HYDRODIURIL) 12.5 MG tablet Take 1 tablet (12.5 mg total) by mouth daily. Take 1 tab by mouth every morning (Patient not taking: Reported on 08/08/2014) 30 tablet 5  . lisinopril (PRINIVIL,ZESTRIL) 10 MG tablet Take 1 tablet (10 mg total) by mouth daily. (Patient not taking: Reported on  08/08/2014) 30 tablet 5  . simvastatin (ZOCOR) 40 MG tablet Take 1 tablet (40 mg total) by mouth at bedtime. (Patient not taking: Reported on 08/08/2014) 30 tablet 1   No current facility-administered medications for this visit.     Review of Systems See HPI     Objective:   Physical Exam BP 136/82 mmHg  Pulse 72  Temp(Src) 98 F (36.7 C) (Oral)  Wt 146 lb 6.4 oz (66.407 kg) Gen: NAD, alert, cooperative with exam HEENT: NCAT, extraocular movements intact, clear conjunctiva, supple neck CV: RRR, good S1/S2, no murmur,  Resp: CTABL, no wheezes,  non-labored Skin: no rashes, normal turgor  Neuro: no gross deficits.  Psych: alert  Leg Exam:  Laterality: right Appearance: The mid shaft of her anterior tibia is slightly swollen but no overlying erythema or ecchymosis Edema: Mild compared to left Tenderness: Midshaft of tibia tender to palpation. No tenderness of ankle. Some tenderness on lateral aspect of right lower leg Range of Motion: Normal plantar and dorsiflexion. Normal knee extension and flexion Strength:  Quadricep: 5/5 Hamstring: 5/5 Gait: normal +2 patellar deep tendon reflexes bilaterally Neurovascularly intact     Assessment & Plan:   Right leg pain Most likely this is a soft tissue contusion related to a traumatic experience. Patient has history of dementia and also lives alone so history is limited. Tender to palpation but no overlying erythema or warmth to suggest an infection. Possible for DVT but less likely as there is no pain over the palpation of the right gastrocnemius Possible for fracture as well as gout - Right tibia and fibula x-ray - Check ESR and CRP for infection versus autoimmune - CBC for infection  Pre-diabetes Well-controlled and history of noncompliance with medications - Continue to monitor for now - A1c today  HYPERTENSION, BENIGN SYSTEMIC Controlled - Not taking medications currently so with will withhold ongoing  HYPERCHOLESTEROLEMIA Lipid panel today Noncompliant with medication

## 2015-06-08 ENCOUNTER — Telehealth: Payer: Self-pay | Admitting: Family Medicine

## 2015-06-08 ENCOUNTER — Encounter: Payer: Self-pay | Admitting: Family Medicine

## 2015-06-08 LAB — HEPATITIS C ANTIBODY: HCV Ab: NEGATIVE

## 2015-06-08 NOTE — Telephone Encounter (Signed)
Sister called and really needs help with her sister. She needs to have a physiatrist and neurologist to evaluate her sister. jw
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
## 2015-06-15 ENCOUNTER — Telehealth: Payer: Self-pay | Admitting: Family Medicine

## 2015-06-15 ENCOUNTER — Encounter: Payer: Self-pay | Admitting: Family Medicine

## 2015-06-15 ENCOUNTER — Ambulatory Visit (INDEPENDENT_AMBULATORY_CARE_PROVIDER_SITE_OTHER): Payer: Medicare Other | Admitting: Family Medicine

## 2015-06-15 VITALS — BP 133/80 | HR 85 | Temp 97.6°F | Wt 144.5 lb

## 2015-06-15 DIAGNOSIS — F0390 Unspecified dementia without behavioral disturbance: Secondary | ICD-10-CM

## 2015-06-15 NOTE — Telephone Encounter (Signed)
Spoke with patient's sister.  She is having concerns that patient is in need of a psychiatrist/therapist.  Dr. Tod Persia note have patient come today for same day appointment.  Appointment scheduled for 3:15 PM with Dr. Jerline Pain at that point patient can be referred to [facility].  Will forward to Dr. Jerline Pain.  Derl Barrow, RN

## 2015-06-15 NOTE — Progress Notes (Signed)
Dr. Jerline Burke requested a Gabriela Burke.   Presenting Issue: Dementia, interpersonal difficulties with family members.  Report of symptoms: Patient is forgetful and having trouble performing basic tasks for everyday living.  Duration of CURRENT symptoms: Two years (per sister report)  Impact on function: Sister reports that patient is unable to care for herself, and that patient's house is in [place]. Sister also reports that patient refuses to take medication.  Psychiatric History - Diagnoses: Dementia  Family history of psychiatric issues: [name] reports that brother has schizophrenia, and that patient took care of the brother when she was younger.  Assessment / Plan / Recommendations:[name] expressed that she was extremely frustrated and overwhelmed, and that she does not feel she can take care of patient any longer. Per sister, patient refuses to let people help her with basic tasks such as cleaning or repairing the house. Sister states that rotten food is laying around the house, and that rats and other animals come and eat the food off the floor. Sister also expressed that she is financially providing for the patient, and does not feel she can do so any longer. Patient acknowledges that she is not able to independently perform everyday tasks, but that she feels she must keep trying so that she does not completely lose her independence. She also stated that she feels her sister is overbearing, and that her sister does not respect her opinion or autonomy in any way; patient became tearful while describing this. [place] attempted to facilitate communication between patient and her sister. [name] reported that patient was "putting on a show" by crying, and just trying to get sympathy. [name] insisted that patient should listen to her, and stated that she felt she was going to have to stop caring for her if she was not placed in an assisted living  facility. Gabriela Burke attempted to talk to the patient about assisted living, but patient stated she was unwilling to consider going. Gabriela Burke consulted with Gabriela Burke and Dr. Nori Burke (preceptor). [name] will schedule an appointment with Dr. Raeford Burke (PCP) for next week. Gabriela Burke will email Gabriela Burke (LCSW) and have her contact patient's sister to discuss procedures for a competency evaluation. Gabriela Burke explained this process to patient's sister, who stated that she was willing to continue caring for the patient while this processes unfolds.

## 2015-06-15 NOTE — Patient Instructions (Signed)
Please come back next week to see Dr Raeford Razor.  You will receive a call next week from our social worker, [name].  Take care,  Dr Jerline Pain

## 2015-06-15 NOTE — Progress Notes (Signed)
    Subjective:  Gabriela Burke is a 65 y.o. female who presents to the [facility] today with her sister with a chief complaint of irritable mood and dementia.   HPI:  Irritable Mood / Dementia Patient with history of dementia. Reports that she has been increasingly frustrated and irritated with her family due to the way they treat her while she is at home. Says that they try to limit what she does even when she knows that she can do it. Did have fleeting thoughts of self harm 3 weeks ago that went away. No HI. Reports that she lives at home alone and is able to accomplish her ADLs independently. Feels safe and does not thinking anyone is abusing her.   Please see additional note from Georgina Peer for additional details.   ROS: Per HPI  Objective:  Physical Exam: BP 133/80 mmHg  Pulse 85  Temp(Src) 97.6 F (36.4 C) (Oral)  Wt 144 lb 8 oz (65.545 kg)  Gen: NAD, resting comfortably CV: RRR with no murmurs appreciated Lungs: NWOB, CTAB with no crackles, wheezes, or rhonchi GI: Normal bowel sounds present. Soft, Nontender, Nondistended. MSK: no edema, cyanosis, or clubbing noted Skin: warm, dry Neuro: grossly normal, moves all extremities Psych: Normal affect and thought content  Assessment/Plan:  Dementia Significant relationship strain between patient and caregiver is evident due to patient's dementia. There are no immediate risks to the patient or her family. Sister will continue to provide care for the patient. Patient will follow up next week with PCP. Will also contact our social worker [name] for competency evaluation.   Please see note from Georgina Peer for further details.  Algis Greenhouse. Jerline Pain, [facility] Resident PGY-2 06/15/2015 5:32 PM

## 2015-06-15 NOTE — Telephone Encounter (Signed)
Patient could come in for a same day appointment this afternoon (if available) and meet with Elton Sin who is doing IC today.  Alternatively, she could present to [facility] ED for evaluation.  Finally, Brad could call patient on the phone to assess what she is willing to do.  Will forward to [employer].

## 2015-06-15 NOTE — Telephone Encounter (Signed)
Pt sister is calling and expresses concern for the pt. She is at her "wits-end" and doesn't know what to do. She feels like the pt needs to speak with a psychiatrist or therapist to better assist. Please advise at the earliest convenience. Sending to [facility] and Dr. Gwenlyn Saran for possible advice.  Sadie Reynolds, ASA

## 2015-06-15 NOTE — Assessment & Plan Note (Signed)
Significant relationship strain between patient and caregiver is evident due to patient's dementia. There are no immediate risks to the patient or her family. Sister will continue to provide care for the patient. Patient will follow up next week with PCP. Will also contact our social worker [name] for competency evaluation.   Please see note from Georgina Peer for further details.

## 2015-06-19 ENCOUNTER — Emergency Department (HOSPITAL_COMMUNITY)
Admission: EM | Admit: 2015-06-19 | Discharge: 2015-06-19 | Disposition: A | Discharge status: Home / Self Care | Payer: Medicare Other | Source: Home / Self Care | Attending: Emergency Medicine | Admitting: Emergency Medicine

## 2015-06-19 ENCOUNTER — Encounter (HOSPITAL_COMMUNITY): Payer: Self-pay | Admitting: Emergency Medicine

## 2015-06-19 ENCOUNTER — Emergency Department (INDEPENDENT_AMBULATORY_CARE_PROVIDER_SITE_OTHER): Payer: Medicare Other

## 2015-06-19 ENCOUNTER — Telehealth: Payer: Self-pay | Admitting: Clinical

## 2015-06-19 DIAGNOSIS — S62102A Fracture of unspecified carpal bone, left wrist, initial encounter for closed fracture: Secondary | ICD-10-CM | POA: Diagnosis not present

## 2015-06-19 MED ORDER — HYDROCODONE-ACETAMINOPHEN 5-325 MG PO TABS: 1.0000 | ORAL_TABLET | ORAL | Status: DC | PRN

## 2015-06-19 NOTE — ED Notes (Signed)
Pt reports she slipped on ice last night and inj her left wrist/arm Sx include swelling and pain A&O x4... No acute distress.

## 2015-06-19 NOTE — Telephone Encounter (Signed)
CSW LVM for pt sister. CSW very familiar with this case and has spoke to sister several times last year regarding her concerns. Sister will continue to be advised to contact APS with concerns or pursue guardianship. Sister did inform CSW last year that she petitioned for guardianship however pt was found to have capacity. If pt remains resistant to ALF placement pt cannot be forced to leave her residence.  [name], MSW, [place]

## 2015-06-19 NOTE — Telephone Encounter (Signed)
-----   Message from Burney Gauze sent at 06/15/2015  5:19 PM EST ----- Regarding: Competency Evaluation for Adult with Dementia Hi Akin Yi,  The attached patient has dementia and her sister is helping care for her. The sister states that the patient is unable to care for herself, and that the patient will not allow her to help with basic tasks like cleaning or self-care. Sister states she's at her "wit's end", and cannot continue caring for her sister - she insists that patient be placed in an assisted living center, and the patient is unwilling to go. I explained that she would have to have an evaluation to be placed in assisted living against her will. I do not know the process to initiate this, and the preceptor told me that I should ask you to call them. Would you be able to help with this?  Thanks! Leroy Sea

## 2015-06-19 NOTE — Progress Notes (Signed)
Orthopedic Tech Progress Note Patient Details:  Gabriela Burke 1951/01/04 [id number]  Ortho Devices Type of Ortho Device: Ace wrap, Arm sling, Sugartong splint Ortho Device/Splint Location: LUE Ortho Device/Splint Interventions: Ordered, Application   Braulio Bosch 06/19/2015, 8:05 PM

## 2015-06-19 NOTE — Discharge Instructions (Signed)
Wrist Fracture °A wrist fracture is a break or crack in one of the bones of your wrist. Your wrist is made up of eight small bones at the palm of your hand (carpal bones) and two long bones that make up your forearm (radius and ulna). The goal of treatment is to hold the injured bone in place while it heals. Surgery may or may not be needed to care for your injured wrist.  °HOME CARE °· Keep your injured wrist raised (elevated). Move your fingers as much as you can. °· Do not put pressure on any part of your cast or splint. It may break. °· Use a plastic bag to protect your cast or splint from water while bathing or showering. Do not lower your cast or splint into water. °· Take medicines only as told by your doctor. °· Keep your cast or splint clean and dry. If it gets wet, damaged, or suddenly feels too tight, tell your doctor right away. °· Do not use any tobacco products including cigarettes, chewing tobacco, or electronic cigarettes. Tobacco can slow bone healing. If you need help quitting, ask your doctor. °· Keep all follow-up visits as told by your doctor. This is important. °· Ask your doctor if you should take supplements of calcium and vitamins C and D. °GET HELP IF:  °· Your cast or splint is damaged, breaks, or gets wet. °· You have a fever. °· You have chills. °· You have very bad pain that does not go away. °· You have more swelling (inflammation) than before the cast was put on. °GET HELP RIGHT AWAY IF:  °· Your hand or fingernails on the injured arm turn blue or gray, or feel cold or numb. °· You lose some feeling in the fingers of your injured arm. °MAKE SURE YOU:  °· Understand these instructions. °· Will watch your condition. °· Will get help right away if you are not doing well or get worse. °  °This information is not intended to replace advice given to you by your health care provider. Make sure you discuss any questions you have with your health care provider. °  °Document Released:  11/12/2007 Document Revised: 06/16/2014 Document Reviewed: 12/08/2011 °Elsevier Interactive Patient Education ©2016 Elsevier Inc. ° °

## 2015-06-19 NOTE — ED Notes (Signed)
Ortho has been called.  

## 2015-06-19 NOTE — ED Provider Notes (Signed)
CSN: [id number]     Arrival date & time 06/19/15  1644 History   First MD Initiated Contact with Patient 06/19/15 1903     Chief Complaint  Patient presents with  . Wrist Injury   (Consider location/radiation/quality/duration/timing/severity/associated sxs/prior Treatment) HPI History obtained from patient:   LOCATION:left wrist SEVERITY:3 DURATION:yesterday CONTEXT:slipped and fell on ice QUALITY:ache MODIFYING FACTORS:cold compress, tylenol ASSOCIATED SYMPTOMS:none TIMING:constant OCCUPATION:  Past Medical History  Diagnosis Date  . HTN (hypertension)   . HLD (hyperlipidemia)   . DM (diabetes mellitus) ([place])    Past Surgical History  Procedure Laterality Date  . Rotator cuff repair    . Total abdominal hysterectomy     Family History  Problem Relation Age of Onset  . Schizophrenia Brother   . Stroke Brother   . Stroke Brother   . Dementia Mother    Social History  Substance Use Topics  . Smoking status: Never Smoker   . Smokeless tobacco: None  . Alcohol Use: No   OB History    No data available     Review of Systems ROS +'veright wrist injury  Denies: HEADACHE, NAUSEA, ABDOMINAL PAIN, CHEST PAIN, CONGESTION, DYSURIA, SHORTNESS OF BREATH  Allergies  Metformin and related and Latex  Home Medications   Prior to Admission medications   Medication Sig Start Date End Date Taking? Authorizing Provider  divalproex (DEPAKOTE ER) 500 MG 24 hr tablet Take 1 tablet (500 mg total) by mouth daily. 08/08/14   Melvenia Beam, MD  donepezil (ARICEPT) 5 MG tablet Take 1 tablet (5 mg total) by mouth at bedtime. 08/08/14   Melvenia Beam, MD  hydrochlorothiazide (HYDRODIURIL) 12.5 MG tablet Take 1 tablet (12.5 mg total) by mouth daily. Take 1 tab by mouth every morning Patient not taking: Reported on 08/08/2014 08/24/13   Angelica Ran, MD  lisinopril (PRINIVIL,ZESTRIL) 10 MG tablet Take 1 tablet (10 mg total) by mouth daily. Patient not taking: Reported on 08/08/2014  08/24/13   Angelica Ran, MD  simvastatin (ZOCOR) 40 MG tablet Take 1 tablet (40 mg total) by mouth at bedtime. Patient not taking: Reported on 08/08/2014 02/16/14   [name], MD   Meds Ordered and Administered this Visit  Medications - No data to display  BP 154/87 mmHg  Pulse 75  Temp(Src) 98 F (36.7 C) (Oral)  Resp 16  SpO2 100% No data found.   Physical Exam  Constitutional: She appears well-developed and well-nourished.  Pulmonary/Chest: Effort normal and breath sounds normal.  Musculoskeletal: She exhibits tenderness.       Left wrist: She exhibits decreased range of motion, tenderness, bony tenderness and swelling.       Arms: Nursing note and vitals reviewed.   ED Course  Procedures (including critical care time)  Labs Review Labs Reviewed - No data to display  Imaging Review No results found.   Visual Acuity Review  Right Eye Distance:   Left Eye Distance:   Bilateral Distance:    Right Eye Near:   Left Eye Near:    Bilateral Near:         MDM   1. Wrist fracture, closed, left, initial encounter    Refer to hand surg for care Discussed with Dr. Annie Main.  Sugar tong splint request from [place], [place] 06/21/15 [phone]

## 2015-06-25 ENCOUNTER — Ambulatory Visit (INDEPENDENT_AMBULATORY_CARE_PROVIDER_SITE_OTHER): Payer: Medicare Other | Admitting: Family Medicine

## 2015-06-25 ENCOUNTER — Ambulatory Visit: Payer: Medicare Other | Admitting: Sports Medicine

## 2015-06-25 ENCOUNTER — Encounter: Payer: Self-pay | Admitting: Family Medicine

## 2015-06-25 VITALS — BP 124/71 | HR 72 | Temp 98.3°F | Wt 145.0 lb

## 2015-06-25 DIAGNOSIS — S52611D Displaced fracture of right ulna styloid process, subsequent encounter for closed fracture with routine healing: Secondary | ICD-10-CM

## 2015-06-25 DIAGNOSIS — F03918 Unspecified dementia, unspecified severity, with other behavioral disturbance: Secondary | ICD-10-CM

## 2015-06-25 DIAGNOSIS — F0391 Unspecified dementia with behavioral disturbance: Secondary | ICD-10-CM

## 2015-06-25 NOTE — Patient Instructions (Signed)
Thank you for coming in,   [facility] is the podiatrist.   I will call APS about opening a case.   I will make a referral to a hand surgeon.   Please bring all of your medications with you to each visit.   Sign up for My Chart to have easy access to your labs results, and communication with your Primary care physician   Please feel free to call with any questions or concerns at any time, at [phone]. --Dr. Raeford Razor

## 2015-06-25 NOTE — Progress Notes (Signed)
Subjective:    Gabriela Burke - 65 y.o. female MRN [id number]  Date of birth: September 30, 1950  HPI  Gabriela Burke is here for ED follow up.  Left wrist fracture:  She was seen at the UC and found to have a diplaced fracture of the fip ot he ulnar sytlod process.  She is unable to recall how she how she hurt her hand.  Her guess is it was in the middle of the day.  Reports she was walking up the hill in front of ehr house and she feel.  Reports she fell forward tripping over a rock.  She reports it being witness but cannot provide a name.   Dementia with Behavioral disturbance.  There is a growing concern for her safety from her sister.  This is especially true with the recent fracture and patient cannot provide a detailed history of the events leading up to the fall.  I had called her last week and was unable to reach APS.  [name] has tried to gain guardianship but patient has been deemed to have capacity as of last year.    Health Maintenance:  Health Maintenance Due  Topic Date Due  . COLONOSCOPY  12/22/1999  . PAP SMEAR  09/07/2001  . ZOSTAVAX  12/21/2009  . MAMMOGRAM  04/21/2013  . TETANUS/TDAP  08/08/2014  . DEXA SCAN  12/22/2014  . PNA vac Low Risk Adult (1 of 2 - PCV13) 12/22/2014    -  reports that she has never smoked. She does not have any smokeless tobacco history on file. - Review of Systems: Per HPI. - Past Medical History: Patient Active Problem List   Diagnosis Date Noted  . Right leg pain 06/07/2015  . Lesion of ala of nose 01/03/2015  . Dementia with behavioral disturbance 08/08/2014  . 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 Dementia 12/18/2010  . Pre-diabetes 11/16/2006  . HYPERCHOLESTEROLEMIA 08/06/2006  . HYPERTENSION, BENIGN SYSTEMIC 08/06/2006   - Medications: reviewed and updated Current Outpatient Prescriptions  Medication Sig Dispense Refill  . divalproex (DEPAKOTE ER) 500 MG 24 hr tablet Take 1 tablet (500 mg  total) by mouth daily. 30 tablet 6  . donepezil (ARICEPT) 5 MG tablet Take 1 tablet (5 mg total) by mouth at bedtime. 30 tablet 6  . hydrochlorothiazide (HYDRODIURIL) 12.5 MG tablet Take 1 tablet (12.5 mg total) by mouth daily. Take 1 tab by mouth every morning (Patient not taking: Reported on 08/08/2014) 30 tablet 5  . HYDROcodone-acetaminophen (NORCO/VICODIN) 5-325 MG tablet Take 1 tablet by mouth every 4 (four) hours as needed. 6 tablet 0  . lisinopril (PRINIVIL,ZESTRIL) 10 MG tablet Take 1 tablet (10 mg total) by mouth daily. (Patient not taking: Reported on 08/08/2014) 30 tablet 5  . simvastatin (ZOCOR) 40 MG tablet Take 1 tablet (40 mg total) by mouth at bedtime. (Patient not taking: Reported on 08/08/2014) 30 tablet 1   No current facility-administered medications for this visit.     Review of Systems See HPI     Objective:   Physical Exam BP 124/71 mmHg  Pulse 72  Temp(Src) 98.3 F (36.8 C) (Oral)  Wt 145 lb (65.772 kg) Gen: NAD, alert, cooperative with exam,  Skin: no rashes, normal turgor  Neuro: no gross deficits.  Psych: alert  MSK: splint in place on left arm. Able to move her fingers and sensation intact   Assessment & Plan:   No problem-specific assessment & plan notes found for this encounter.

## 2015-06-26 DIAGNOSIS — S52613A Displaced fracture of unspecified ulna styloid process, initial encounter for closed fracture: Secondary | ICD-10-CM | POA: Insufficient documentation

## 2015-06-26 NOTE — Assessment & Plan Note (Addendum)
There is concern about patient's safety as she had a fall with a fracture.  Sister reports a hoarding behavior and patient has been using a heater.  I spoke with the patient last week and tried to contact APS.  - will try to contact APS again - will refer her to neurology as she has not follow up.   Called APS [phone] but unable to reach anyone at this number. I will continue to try reach them.   [name], MD PGY-3, [facility] 06/26/2015, 2:08 PM

## 2015-06-26 NOTE — Assessment & Plan Note (Signed)
Was evaluated at Urgent care  - refer to hand surgeon.

## 2015-07-16 ENCOUNTER — Ambulatory Visit (INDEPENDENT_AMBULATORY_CARE_PROVIDER_SITE_OTHER): Payer: Medicare Other | Admitting: Sports Medicine

## 2015-07-16 ENCOUNTER — Encounter: Payer: Self-pay | Admitting: Sports Medicine

## 2015-07-16 DIAGNOSIS — M79672 Pain in left foot: Secondary | ICD-10-CM | POA: Diagnosis not present

## 2015-07-16 DIAGNOSIS — M79671 Pain in right foot: Secondary | ICD-10-CM

## 2015-07-16 DIAGNOSIS — E119 Type 2 diabetes mellitus without complications: Secondary | ICD-10-CM | POA: Diagnosis not present

## 2015-07-16 DIAGNOSIS — B351 Tinea unguium: Secondary | ICD-10-CM

## 2015-07-16 NOTE — Progress Notes (Signed)
Patient ID: Gabriela Burke, female   DOB: 04-16-1951, 65 y.o.   MRN: [id number] Subjective: Gabriela Burke is a 65 y.o. female patient with history of type 2 diabetes who presents to office today complaining of long, painful nails  while ambulating in shoes; unable to trim. Patient states that the glucose reading this morning was "fine" unable to give #. Patient denies any new changes in medication or new problems. Patient denies any new cramping, numbness, burning or tingling in the legs.  Patient Active Problem List   Diagnosis Date Noted  . Fracture of ulnar styloid 06/26/2015  . Right leg pain 06/07/2015  . Lesion of ala of nose 01/03/2015  . Dementia with behavioral disturbance 08/08/2014  . 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 Dementia 12/18/2010  . Pre-diabetes 11/16/2006  . HYPERCHOLESTEROLEMIA 08/06/2006  . HYPERTENSION, BENIGN SYSTEMIC 08/06/2006   Current Outpatient Prescriptions on File Prior to Visit  Medication Sig Dispense Refill  . divalproex (DEPAKOTE ER) 500 MG 24 hr tablet Take 1 tablet (500 mg total) by mouth daily. 30 tablet 6  . donepezil (ARICEPT) 5 MG tablet Take 1 tablet (5 mg total) by mouth at bedtime. 30 tablet 6  . hydrochlorothiazide (HYDRODIURIL) 12.5 MG tablet Take 1 tablet (12.5 mg total) by mouth daily. Take 1 tab by mouth every morning (Patient not taking: Reported on 08/08/2014) 30 tablet 5  . HYDROcodone-acetaminophen (NORCO/VICODIN) 5-325 MG tablet Take 1 tablet by mouth every 4 (four) hours as needed. 6 tablet 0  . lisinopril (PRINIVIL,ZESTRIL) 10 MG tablet Take 1 tablet (10 mg total) by mouth daily. (Patient not taking: Reported on 08/08/2014) 30 tablet 5  . simvastatin (ZOCOR) 40 MG tablet Take 1 tablet (40 mg total) by mouth at bedtime. (Patient not taking: Reported on 08/08/2014) 30 tablet 1   No current facility-administered medications on file prior to visit.   Allergies  Allergen Reactions  . Metformin And Related Nausea Only   GI upset   . Latex Other (See Comments)    unknown     Objective: General: Patient is awake, alert, and oriented,in no acute distress. Hx of Dementia with behavior disturbance; Cast on left arm; for fighting per patient   Integument: Skin is warm, dry and supple bilateral. Nails are tender, long, thickened and  dystrophic with subungual debris, consistent with onychomycosis, 1-5 bilateral. No signs of infection. No open lesions or preulcerative lesions present bilateral. Remaining integument unremarkable.  Vasculature:  Dorsalis Pedis pulse 2/4 bilateral. Posterior Tibial pulse  1/4 bilateral.  Capillary fill time <3 sec 1-5 bilateral. Positive hair growth to the level of the digits. Temperature gradient within normal limits. No varicosities present bilateral. No edema present bilateral.   Neurology: The patient has intact sensation measured with a 5.07/10g Semmes Weinstein Monofilament at all pedal sites bilateral . Vibratory sensation intact bilateral with tuning fork. No Babinski sign present bilateral.   Musculoskeletal: No gross pedal deformities noted bilateral. Muscular strength 5/5 in all lower extremity muscular groups bilateral without pain or limitation on range of motion . No tenderness with calf compression bilateral.  Assessment and Plan: Problem List Items Addressed This Visit    None    Visit Diagnoses    Dermatophytosis of nail    -  Primary    Diabetes mellitus without complication (HCC)        Foot pain, bilateral          -Examined patient. -Discussed and educated patient on diabetic foot care, especially with  regards to the vascular, neurological and musculoskeletal systems.  -Stressed the importance of good glycemic control and the detriment of not  controlling glucose levels in relation to the foot. -Mechanically debrided all nails 1-5 bilateral using sterile nail nipper and filed with dremel without incident  -Recommend daily skin creams from dry skin  bilateral -Answered all patient questions -Patient to return in 3 months for at risk foot care -Patient advised to call the office if any problems or questions arise in the  Meantime.  Landis Martins, DPM

## 2015-07-16 NOTE — Telephone Encounter (Signed)
Pt sister is here with pt and is concerned about this issue. Pt sister wold like to speak to someone regarding matter. Sadie Reynolds, ASA

## 2015-07-16 NOTE — Patient Instructions (Signed)
Diabetes and Foot Care Diabetes may cause you to have problems because of poor blood supply (circulation) to your feet and legs. This may cause the skin on your feet to become thinner, break easier, and heal more slowly. Your skin may become dry, and the skin may peel and crack. You may also have nerve damage in your legs and feet causing decreased feeling in them. You may not notice minor injuries to your feet that could lead to infections or more serious problems. Taking care of your feet is one of the most important things you can do for yourself.  HOME CARE INSTRUCTIONS  Wear shoes at all times, even in the house. Do not go barefoot. Bare feet are easily injured.  Check your feet daily for blisters, cuts, and redness. If you cannot see the bottom of your feet, use a mirror or ask someone for help.  Wash your feet with warm water (do not use hot water) and mild soap. Then pat your feet and the areas between your toes until they are completely dry. Do not soak your feet as this can dry your skin.  Apply a moisturizing lotion or petroleum jelly (that does not contain alcohol and is unscented) to the skin on your feet and to dry, brittle toenails. Do not apply lotion between your toes.  Trim your toenails straight across. Do not dig under them or around the cuticle. File the edges of your nails with an emery board or nail file.  Do not cut corns or calluses or try to remove them with medicine.  Wear clean socks or stockings every day. Make sure they are not too tight. Do not wear knee-high stockings since they may decrease blood flow to your legs.  Wear shoes that fit properly and have enough cushioning. To break in new shoes, wear them for just a few hours a day. This prevents you from injuring your feet. Always look in your shoes before you put them on to be sure there are no objects inside.  Do not cross your legs. This may decrease the blood flow to your feet.  If you find a minor scrape,  cut, or break in the skin on your feet, keep it and the skin around it clean and dry. These areas may be cleansed with mild soap and water. Do not cleanse the area with peroxide, alcohol, or iodine.  When you remove an adhesive bandage, be sure not to damage the skin around it.  If you have a wound, look at it several times a day to make sure it is healing.  Do not use heating pads or hot water bottles. They may burn your skin. If you have lost feeling in your feet or legs, you may not know it is happening until it is too late.  Make sure your health care provider performs a complete foot exam at least annually or more often if you have foot problems. Report any cuts, sores, or bruises to your health care provider immediately. SEEK MEDICAL CARE IF:   You have an injury that is not healing.  You have cuts or breaks in the skin.  You have an ingrown nail.  You notice redness on your legs or feet.  You feel burning or tingling in your legs or feet.  You have pain or cramps in your legs and feet.  Your legs or feet are numb.  Your feet always feel cold. SEEK IMMEDIATE MEDICAL CARE IF:   There is increasing redness,   swelling, or pain in or around a wound.  There is a red line that goes up your leg.  Pus is coming from a wound.  You develop a fever or as directed by your health care provider.  You notice a bad smell coming from an ulcer or wound.   This information is not intended to replace advice given to you by your health care provider. Make sure you discuss any questions you have with your health care provider.   Document Released: 05/23/2000 Document Revised: 01/26/2013 Document Reviewed: 11/02/2012 Elsevier Interactive Patient Education 2016 Elsevier Inc.  

## 2015-07-18 ENCOUNTER — Ambulatory Visit (INDEPENDENT_AMBULATORY_CARE_PROVIDER_SITE_OTHER): Payer: Medicare Other | Admitting: Family Medicine

## 2015-07-18 ENCOUNTER — Encounter: Payer: Self-pay | Admitting: Family Medicine

## 2015-07-18 VITALS — BP 133/70 | HR 90 | Temp 98.1°F | Wt 145.3 lb

## 2015-07-18 DIAGNOSIS — S52612D Displaced fracture of left ulna styloid process, subsequent encounter for closed fracture with routine healing: Secondary | ICD-10-CM

## 2015-07-18 DIAGNOSIS — S52502D Unspecified fracture of the lower end of left radius, subsequent encounter for closed fracture with routine healing: Secondary | ICD-10-CM | POA: Diagnosis not present

## 2015-07-18 NOTE — Patient Instructions (Signed)
Thank you for coming in,   I have made a referral to the Orthopedic office.   Please go to the radiology department at [facility] for x-rays. This is located on the first floor.   Sign up for My Chart to have easy access to your labs results, and communication with your Primary care physician   Please feel free to call with any questions or concerns at any time, at [phone]. --Dr. Raeford Razor

## 2015-07-19 ENCOUNTER — Telehealth: Payer: Self-pay | Admitting: Family Medicine

## 2015-07-19 DIAGNOSIS — S52509A Unspecified fracture of the lower end of unspecified radius, initial encounter for closed fracture: Secondary | ICD-10-CM | POA: Insufficient documentation

## 2015-07-19 NOTE — Assessment & Plan Note (Signed)
Has not been seen by a specialist yet.  Splint was removed and placed in a wrist brace.  - referral to ortho - discussed with Dr. Nori Riis.

## 2015-07-19 NOTE — Progress Notes (Signed)
Subjective:    Gabriela Burke - 65 y.o. female MRN [id number]  Date of birth: December 23, 1950  CC left arm fracture   HPI  Gabriela Burke is here for left arm fracture.   She was seen in the ED (06/19/15) after a fall onto her left arm and she suffered a diplaced ulnar sytloid fracture as well as Nondisplaced fracture of the distal radial metaphysis. She was placed in a long arm splint.  She was seen at the [facility] on 1/16 and referred to a hand surgeon.  She has not been able to establish an appointment to be seen by the specialist.  She is presenting today wearing the same splint that was placed in the ED on 06/19/15.  Still having pain for which she is taking tylenol for.  Pain is worse when it is touched.   Health Maintenance:  Health Maintenance Due  Topic Date Due  . COLONOSCOPY  12/22/1999  . PAP SMEAR  09/07/2001  . ZOSTAVAX  12/21/2009  . MAMMOGRAM  04/21/2013  . TETANUS/TDAP  08/08/2014  . DEXA SCAN  12/22/2014  . PNA vac Low Risk Adult (1 of 2 - PCV13) 12/22/2014    -  reports that she has never smoked. She does not have any smokeless tobacco history on file. - Review of Systems: Per HPI. - Past Medical History: Patient Active Problem List   Diagnosis Date Noted  . Distal radial fracture 07/19/2015  . Fracture of ulnar styloid 06/26/2015  . Right leg pain 06/07/2015  . Lesion of ala of nose 01/03/2015  . Dementia with behavioral disturbance 08/08/2014  . Pituitary incidentaloma ([place]) 04/23/2014  . Dementia 12/18/2010  . Pre-diabetes 11/16/2006  . HYPERCHOLESTEROLEMIA 08/06/2006  . HYPERTENSION, BENIGN SYSTEMIC 08/06/2006   - Medications: reviewed and updated Current Outpatient Prescriptions  Medication Sig Dispense Refill  . divalproex (DEPAKOTE ER) 500 MG 24 hr tablet Take 1 tablet (500 mg total) by mouth daily. 30 tablet 6  . donepezil (ARICEPT) 5 MG tablet Take 1 tablet (5 mg total) by mouth at bedtime. 30 tablet 6  . hydrochlorothiazide  (HYDRODIURIL) 12.5 MG tablet Take 1 tablet (12.5 mg total) by mouth daily. Take 1 tab by mouth every morning (Patient not taking: Reported on 08/08/2014) 30 tablet 5  . HYDROcodone-acetaminophen (NORCO/VICODIN) 5-325 MG tablet Take 1 tablet by mouth every 4 (four) hours as needed. (Patient not taking: Reported on 07/18/2015) 6 tablet 0  . lisinopril (PRINIVIL,ZESTRIL) 10 MG tablet Take 1 tablet (10 mg total) by mouth daily. (Patient not taking: Reported on 08/08/2014) 30 tablet 5  . simvastatin (ZOCOR) 40 MG tablet Take 1 tablet (40 mg total) by mouth at bedtime. (Patient not taking: Reported on 08/08/2014) 30 tablet 1   No current facility-administered medications for this visit.     Review of Systems See HPI     Objective:   Physical Exam BP 133/70 mmHg  Pulse 90  Temp(Src) 98.1 F (36.7 C) (Oral)  Wt 145 lb 4.8 oz (65.908 kg) Gen: NAD, alert,  Skin: no skin breakdown  Neuro: normal sensation in her left hand and arm  MSK: no obvious defect of her left arm  TTP over the distal ulna and radius  Has limited ROM in flexion, extension, radial and ulnar deviation secondary to pain.  Neurovascularly intact     Assessment & Plan:   Distal radial fracture Splint was removed and she was place in a wrist brace.  - referral to ortho.  Fracture of ulnar styloid Has not been seen by a specialist yet.  Splint was removed and placed in a wrist brace.  - referral to ortho - discussed with Dr. Nori Riis.

## 2015-07-19 NOTE — Telephone Encounter (Signed)
Pt was seen in clinic yesterday 07/18/15. Laprecious Austill,CMA

## 2015-07-19 NOTE — Assessment & Plan Note (Signed)
Splint was removed and she was place in a wrist brace.  - referral to ortho.

## 2015-07-19 NOTE — Telephone Encounter (Signed)
Pt sister calling to ask PCP about having home care for patient.  Please advise.  Sadie Reynolds, ASA

## 2015-07-23 ENCOUNTER — Ambulatory Visit (HOSPITAL_COMMUNITY)
Admission: RE | Admit: 2015-07-23 | Discharge: 2015-07-23 | Disposition: A | Discharge status: Home / Self Care | Payer: Medicare Other | Source: Ambulatory Visit | Attending: Family Medicine | Admitting: Family Medicine

## 2015-07-23 ENCOUNTER — Encounter: Payer: Self-pay | Admitting: Family Medicine

## 2015-07-23 ENCOUNTER — Ambulatory Visit (INDEPENDENT_AMBULATORY_CARE_PROVIDER_SITE_OTHER): Payer: Medicare Other | Admitting: Family Medicine

## 2015-07-23 VITALS — BP 155/73 | HR 72 | Temp 98.1°F | Wt 146.7 lb

## 2015-07-23 DIAGNOSIS — S52612D Displaced fracture of left ulna styloid process, subsequent encounter for closed fracture with routine healing: Secondary | ICD-10-CM | POA: Diagnosis not present

## 2015-07-23 DIAGNOSIS — X58XXXD Exposure to other specified factors, subsequent encounter: Secondary | ICD-10-CM | POA: Insufficient documentation

## 2015-07-23 DIAGNOSIS — B356 Tinea cruris: Secondary | ICD-10-CM | POA: Diagnosis not present

## 2015-07-23 MED ORDER — KETOCONAZOLE 2 % EX CREA: 1.0000 "application " | TOPICAL_CREAM | Freq: Every day | CUTANEOUS | Status: DC

## 2015-07-23 NOTE — Progress Notes (Signed)
   Subjective:    Patient ID: Gabriela Burke, female    DOB: July 02, 1950, 65 y.o.   MRN: [id number]  HPI  Patient presents for Same Day Appointment  CC: rash  # Groin rash:  Right side groin  First noticed a few days ago  Was worse yesterday, A&D ointment and gauze put on yesterday and looks better today  Not draining  Somewhat painful, more itchy than any other complaint  Hasn't spread anywhere ROS: no fevers, no chills  Social Hx: never smoker  Review of Systems   See HPI for ROS.   Past medical history, surgical, family, and social history reviewed and updated in the EMR as appropriate.  Objective:  BP 155/73 mmHg  Pulse 72  Temp(Src) 98.1 F (36.7 C) (Oral)  Wt 146 lb 11.2 oz (66.543 kg) Vitals and nursing note reviewed  General: no apparent distress  Skin: right inguinal area there is an erythematous and scaly/beefy appearing skin along the groin crease, does not spread beyond 2-3cm.    Assessment & Plan:   1. Tinea cruris Ketoconazole cream x 2 weeks, follow up if not improving

## 2015-07-23 NOTE — Patient Instructions (Addendum)
The area looks like a fungal infection.  Fungus likes to grow in warm, dark, moist places (armpits, groin, under skin folds, under breasts).  Use the ketoconazole 2% cream twice a day for the next 2 weeks. Make sure the skin is dry after showering. In the future you can use an antifungal powder in affected areas to keep it dry.  You are due for several health screenings, I would recommend scheduling an appointment with your doctor to discuss these.

## 2015-07-26 ENCOUNTER — Ambulatory Visit
Admission: RE | Admit: 2015-07-26 | Discharge: 2015-07-26 | Disposition: A | Discharge status: Home / Self Care | Payer: Medicare Other | Source: Ambulatory Visit | Attending: Neurology | Admitting: Neurology

## 2015-07-26 ENCOUNTER — Telehealth: Payer: Self-pay | Admitting: *Deleted

## 2015-07-26 ENCOUNTER — Ambulatory Visit (INDEPENDENT_AMBULATORY_CARE_PROVIDER_SITE_OTHER): Payer: Medicare Other | Admitting: Neurology

## 2015-07-26 ENCOUNTER — Encounter: Payer: Self-pay | Admitting: Neurology

## 2015-07-26 VITALS — BP 115/60 | HR 87 | Ht 64.0 in | Wt 146.0 lb

## 2015-07-26 DIAGNOSIS — F22 Delusional disorders: Secondary | ICD-10-CM

## 2015-07-26 DIAGNOSIS — F29 Unspecified psychosis not due to a substance or known physiological condition: Secondary | ICD-10-CM | POA: Diagnosis not present

## 2015-07-26 DIAGNOSIS — R413 Other amnesia: Secondary | ICD-10-CM | POA: Diagnosis not present

## 2015-07-26 DIAGNOSIS — F99 Mental disorder, not otherwise specified: Secondary | ICD-10-CM | POA: Diagnosis not present

## 2015-07-26 DIAGNOSIS — R443 Hallucinations, unspecified: Secondary | ICD-10-CM
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
## 2015-07-26 NOTE — Telephone Encounter (Signed)
Terrence Dupont I will give you all records and Insurance for fax . Telephone [phone] Fax [phone]. When Dr. Jaynee Eagles finishes her note we can attach. Thanks Hinton Dyer.

## 2015-07-28 LAB — B12 AND FOLATE PANEL
Folate: >20 ng/mL (ref >3.0)
Vitamin B-12: 434 pg/mL (ref 211–946)

## 2015-07-28 LAB — COMPREHENSIVE METABOLIC PANEL
ALT: 12 IU/L (ref 0–32)
AST: 25 IU/L (ref 0–40)
Albumin/Globulin Ratio: 1.7 (ref 1.1–2.5)
Albumin: 4.4 g/dL (ref 3.6–4.8)
Alkaline Phosphatase: 97 IU/L (ref 39–117)
BUN/Creatinine Ratio: 18 (ref 11–26)
BUN: 15 mg/dL (ref 8–27)
Bilirubin Total: 0.8 mg/dL (ref 0.0–1.2)
CO2: 25 mmol/L (ref 18–29)
Calcium: 9.5 mg/dL (ref 8.7–10.3)
Chloride: 102 mmol/L (ref 96–106)
Creatinine, Ser: 0.83 mg/dL (ref 0.57–1.00)
GFR calc Af Amer: 86 mL/min/{1.73_m2} (ref >59)
GFR calc non Af Amer: 74 mL/min/{1.73_m2} (ref >59)
Globulin, Total: 2.6 g/dL (ref 1.5–4.5)
Glucose: 111 mg/dL — ABNORMAL HIGH (ref 65–99)
Potassium: 4.2 mmol/L (ref 3.5–5.2)
Sodium: 144 mmol/L (ref 134–144)
Total Protein: 7 g/dL (ref 6.0–8.5)

## 2015-07-28 LAB — THYROID PANEL WITH TSH
Free Thyroxine Index: 1.5 (ref 1.2–4.9)
T3 Uptake Ratio: 26 % (ref 24–39)
T4, Total: 5.8 ug/dL (ref 4.5–12.0)
TSH: 0.606 u[IU]/mL (ref 0.450–4.500)

## 2015-07-28 LAB — CBC
Hematocrit: 40.5 % (ref 34.0–46.6)
Hemoglobin: 13.6 g/dL (ref 11.1–15.9)
MCH: 28.9 pg (ref 26.6–33.0)
MCHC: 33.6 g/dL (ref 31.5–35.7)
MCV: 86 fL (ref 79–97)
Platelets: 256 10*3/uL (ref 150–379)
RBC: 4.71 x10E6/uL (ref 3.77–5.28)
RDW: 13.4 % (ref 12.3–15.4)
WBC: 5.1 10*3/uL (ref 3.4–10.8)

## 2015-07-28 LAB — RPR: RPR Ser Ql: NONREACTIVE

## 2015-07-28 LAB — HEAVY METALS, BLOOD
Arsenic: 6 ug/L (ref 2–23)
Lead, Blood: 1 ug/dL (ref 0–19)
Mercury: 6.4 ug/L (ref 0.0–14.9)

## 2015-07-28 LAB — AMMONIA: Ammonia: 61 ug/dL (ref 19–87)

## 2015-07-28 LAB — ANA W/REFLEX: Anti Nuclear Antibody(ANA): NEGATIVE

## 2015-07-28 LAB — PTH, INTACT AND CALCIUM: PTH: 26 pg/mL (ref 15–65)

## 2015-07-28 LAB — B. BURGDORFI ANTIBODIES: Lyme IgG/IgM Ab: <0.91 {ISR} (ref 0.00–0.90)

## 2015-07-28 LAB — HIV ANTIBODY (ROUTINE TESTING W REFLEX): HIV Screen 4th Generation wRfx: NONREACTIVE

## 2015-07-30 ENCOUNTER — Telehealth: Payer: Self-pay | Admitting: Neurology

## 2015-07-30 NOTE — Telephone Encounter (Signed)
Call sister to discuss fdg-pet scan

## 2015-07-30 NOTE — Telephone Encounter (Signed)
Spoke with sister, [name] who is patient's Camera operator and informed her all lab results are normal. She stated she didn't know what to do next in trying to get further help for her sister.  She stated it is increasingly more difficult for her to care for patient because she is her sister , and patient refuses to cooperate.  She stated she would appreciate a call from Dr Jaynee Eagles if possible. Advised this RN will route her request to dr. She verbalized understanding, appreciation.

## 2015-07-31 NOTE — Telephone Encounter (Signed)
Gabriela Burke, please let patient know I have forwarded my office note to Dr. Casimiro Needle. Please ask Hinton Dyer to fax my completed note to Dr. Casimiro Needle as well with a referral and to ensure the right phone number is there per sister. I updated my note tonight so I need a new one to go to Dr. Casimiro Needle. Have Hinton Dyer provide Dr. Karen Chafe phone number to his private office to patient's sister (not the [facility] office).  I also am going to order something called an FDG-PET scan which is a more sensitive brain study for different forms of dementia. Let her know I am in the hospital this week but will call her to discuss as soon as I can. In the meantime we will work on the new imaging study approval and I highly encourage her to take pateint to Dr. Casimiro Needle who specializes in geriatric psychiatry for his opinion.

## 2015-08-01 NOTE — Telephone Encounter (Signed)
Called and spoke to laurie at Dr. Casimiro Needle office . She relayed they received notes  Last week I faxed Dr. Cathren Laine new note to [phone] today. [name] will have to call and schedule patient . Called patient's sister left her a message asking her to call me back.  If patient's sister calls back  please give her telephone [phone]. This is registration for his office she has to Make apt. Or I can speak with her.  Dr. Jaynee Eagles I didn't see order for FDG- PET Scan are you still wanting that done ? Please advise.

## 2015-08-01 NOTE — Telephone Encounter (Signed)
Called sister and she relayed to me she was at the ,doctor for her self . I relayed to her to call me when she could

## 2015-08-06 ENCOUNTER — Other Ambulatory Visit: Payer: Self-pay | Admitting: Neurology

## 2015-08-06 DIAGNOSIS — F0391 Unspecified dementia with behavioral disturbance: Secondary | ICD-10-CM

## 2015-08-06 NOTE — Telephone Encounter (Signed)
Please place order for PET Scan.  Called and spoke to patient's  sister Meredith Mody and she relayed Apt with Dr. Sheran Spine office apt April 3rd that was there first avab. Patient is on CX list. [name] is asking for MRI results.

## 2015-08-06 NOTE — Telephone Encounter (Signed)
Spoke to sister. FDG PET scan of the brain ordered. She has an appointment with Dr. Casimiro Needle, geriatric psychiatrist next month.

## 2015-08-06 NOTE — Telephone Encounter (Signed)
Called sister back. Relayed MRI results per Dr Jaynee Eagles note. She verbalized understanding and would like a copy of results mailed to her. Advised I will mail copy and verified address. She knows Dr Jaynee Eagles will call her to discuss.

## 2015-08-06 NOTE — Telephone Encounter (Signed)
Called and spoke to patient's sister PET SCAN . Scheduled at [facility]  08/15/2015. check in  1st floor radiology at 7:30 am for 8:00 apt.  . Patient needs to be NPO six hours before test. No per cert needed required  [facility]. [name] understood all details.

## 2015-08-10 ENCOUNTER — Ambulatory Visit (HOSPITAL_COMMUNITY): Payer: Medicare Other

## 2015-08-15 ENCOUNTER — Telehealth: Payer: Self-pay | Admitting: *Deleted

## 2015-08-15 ENCOUNTER — Ambulatory Visit (HOSPITAL_COMMUNITY)
Admission: RE | Admit: 2015-08-15 | Discharge: 2015-08-15 | Disposition: A | Discharge status: Home / Self Care | Payer: Medicare Other | Source: Ambulatory Visit | Attending: Neurology | Admitting: Neurology

## 2015-08-15 DIAGNOSIS — F0391 Unspecified dementia with behavioral disturbance: Secondary | ICD-10-CM | POA: Diagnosis present

## 2015-08-15 DIAGNOSIS — G309 Alzheimer's disease, unspecified: Secondary | ICD-10-CM | POA: Insufficient documentation

## 2015-08-15 MED ORDER — FLUDEOXYGLUCOSE F - 18 (FDG) INJECTION
10.0000 | Freq: Once | INTRAVENOUS | Status: AC | PRN
  Administered 2015-08-15: 10 via INTRAVENOUS

## 2015-08-15 NOTE — Telephone Encounter (Signed)
LVM for sister to call about results. Gave [facility] phone number.

## 2015-08-15 NOTE — Telephone Encounter (Signed)
-----   Message from Melvenia Beam, MD sent at 08/15/2015  9:55 AM EST ----- The PET scan is consistent with Alzheimer's type dementia. Has she scheduled an appointment with Dr. Casimiro Needle yet? thanks

## 2015-08-15 NOTE — Telephone Encounter (Signed)
[name] [name] is returning a call regarding the patient.

## 2015-08-15 NOTE — Telephone Encounter (Signed)
Called sister back. Apt w/ Dr Casimiro Needle in April. She is going to call their office to verify appt time. Explained what a PET scan was. Relayed results per Dr Jaynee Eagles. She verbalized understanding.

## 2015-08-16 NOTE — Telephone Encounter (Signed)
FYI

## 2015-08-21 ENCOUNTER — Telehealth: Payer: Self-pay | Admitting: Family Medicine

## 2015-08-21 NOTE — Telephone Encounter (Signed)
pts daughter would like to talk to dr schmitz about [facility].  Daughter is having trouble keeping mother in house at night.  Daughter doesn't have the financial resources to have someone to care for her mother. She is needing advice about possible options.

## 2015-08-28 ENCOUNTER — Telehealth: Payer: Self-pay | Admitting: Licensed Clinical Social Worker

## 2015-08-28 NOTE — Telephone Encounter (Signed)
Return call from patient's sister Ms. Gabriela Burke, states she called to inquire about advice and options for patient.  Patient does not have a daughter.  Per Ms. Gabriela Burke she is POA for patient.    Sister states she has hired someone to stay with patient at night as there were concerns of her leaving the house.  In addition, other family members also check in on patient.  Sister shared concerns of needing more support for patient.  CSW provided sister with resource on the PACE program.  Program of [place] [facility].   Sister was thankful and appreciate of the information provided.  States she will follow-up them to see if they are able to assist.  Casimer Lanius. [place] [employer],  [phone] 4:09 PM

## 2015-09-10 ENCOUNTER — Telehealth: Payer: Self-pay | Admitting: *Deleted

## 2015-09-10 NOTE — Telephone Encounter (Signed)
Spoke to Dr. Casimiro Needle, advised im results of the pet mri showed decreased uptake in the areas associated with alzheimers. He is seeing patient and her sister. thanks

## 2015-09-10 NOTE — Telephone Encounter (Signed)
Pt saw him last week.  Family stated she had a PET scan

## 2015-09-10 NOTE — Telephone Encounter (Signed)
Dr Casimiro Needle called and spoke to me about pt PET/MRI results. Relayed results per Dr Jaynee Eagles note. Dr Jaynee Eagles also spoke to him.

## 2015-09-27 ENCOUNTER — Other Ambulatory Visit: Payer: Self-pay | Admitting: *Deleted

## 2015-09-27 DIAGNOSIS — F03918 Unspecified dementia, unspecified severity, with other behavioral disturbance: Secondary | ICD-10-CM

## 2015-09-27 DIAGNOSIS — F0391 Unspecified dementia with behavioral disturbance: Secondary | ICD-10-CM

## 2015-09-27 NOTE — Telephone Encounter (Signed)
Pt is not taking meds at this time, but sister wants to "try again since her son is here to help me".  States that she needs a refill of all meds. Candelaria Pies, [name], CMA

## 2015-09-28 ENCOUNTER — Ambulatory Visit (INDEPENDENT_AMBULATORY_CARE_PROVIDER_SITE_OTHER): Payer: Medicare Other | Admitting: Family Medicine

## 2015-09-28 ENCOUNTER — Encounter: Payer: Self-pay | Admitting: Family Medicine

## 2015-09-28 VITALS — BP 121/71 | HR 69 | Temp 98.4°F | Wt 136.1 lb

## 2015-09-28 DIAGNOSIS — R634 Abnormal weight loss: Secondary | ICD-10-CM | POA: Diagnosis not present

## 2015-09-28 DIAGNOSIS — N760 Acute vaginitis: Secondary | ICD-10-CM | POA: Diagnosis not present

## 2015-09-28 DIAGNOSIS — F03918 Unspecified dementia, unspecified severity, with other behavioral disturbance: Secondary | ICD-10-CM

## 2015-09-28 DIAGNOSIS — F0391 Unspecified dementia with behavioral disturbance: Secondary | ICD-10-CM

## 2015-09-28 LAB — POCT WET PREP (WET MOUNT): Clue Cells Wet Prep Whiff POC: NEGATIVE

## 2015-09-28 NOTE — Patient Instructions (Signed)
Thank you for coming in to clinic today.  1. For you history of vaginal itching / vaginitis - pelvic exam is completely normal. No evidence of rash or thinning of vaginal tissue. No discharge or sign of yeast. - We sent sample to lab to double check for yeast and will notify you if it is positive - Recommend avoid using soaps or sensitive products to avoid irritation to this area  2. For weight loss, - I recommend starting a daily meal supplement with Ensure once a day between meals, and work on getting at least 2 to 3 regular meals a day, try to work on providing meals or having food pre-prepared, work with family members to improve this - Prior lab work done in 07/2015 does not show any other sign causing the weight loss - This is very common in dementia - There is a medication that helps sleep and depression and can improve Weight Gain in elderly, called Mirtazapine (Remeron) you may ask your Psychiatrist or Neurologist about this as well  Please schedule a follow-up appointment with Dr Raeford Razor within 1-3 months to follow-up Dementia  Please schedule a [facility] Geriatrics new patient appointment with Dr McDiarmid within next 1 to 2 months to discuss Dementia and future placement options  You may look into [facility] in [place], [place] Address: [address], [place], [place] [address] Phone: [phone]  If you have any other questions or concerns, please feel free to call the clinic to contact me. You may also schedule an earlier appointment if necessary.  However, if your symptoms get significantly worse, please go to the Emergency Department to seek immediate medical attention.  Nobie Putnam, [place]

## 2015-09-28 NOTE — Assessment & Plan Note (Addendum)
Likely secondary to reduced appetite and difficulty with meal preparation/planning. See details under Dementia A&P. No other significant labs needed, recently had extensive panel 07/2015, including normal Albumin 4.4 - Family support changes for meals - Start Ensure trial - Future consider Mirtazapine

## 2015-09-28 NOTE — Progress Notes (Signed)
Subjective:    Patient ID: Gabriela Burke, female    DOB: December 07, 1950, 65 y.o.   MRN: [id number]  Gabriela Burke is a 65 y.o. female presenting on 09/28/2015 for Vaginal Itching   Patient presents for a same day appointment. History provided by both patient and sister.   HPI   VAGINITIS: - Reports recent intermittent history with "vaginal itching", however recently seems to be improved. Denies vaginal discharge. Her sister had advised her to "not use dial soap" to clean her private area, but patient insists this is not the problem. No recent antibiotics. No other vaginal cleansing treatments. No history of atrophic vaginitis or using any vaginal topical medicines. - Denies any ulceration / lesions, rash, erythema, fever/chills  WEIGHT LOSS / DEMENTIA: - Primarily reported as a concern from sister today, recent 10 lb wt loss over past 2 months, seems to be due to poor eating. Patient admits to having a decent appetite but does not always eat 3 meals a day. Sometimes difficulty with having food available or prepared. Patient lives alone in a home, with her nephew recently moved in (but not always available), her sister checks on her frequently to help with groceries etc. - Asking about future housing arrangement plans, looked into [facility] but patient not interested - Denies any nausea, vomiting, abdominal pain, night sweats   Social History  Substance Use Topics  . Smoking status: Never Smoker   . Smokeless tobacco: Not on file  . Alcohol Use: No    Review of Systems Per HPI unless specifically indicated above     Objective:    BP 121/71 mmHg  Pulse 69  Temp(Src) 98.4 F (36.9 C) (Oral)  Wt 136 lb 1.6 oz (61.735 kg)  Wt Readings from Last 3 Encounters:  09/28/15 136 lb 1.6 oz (61.735 kg)  07/26/15 146 lb (66.225 kg)  07/23/15 146 lb 11.2 oz (66.543 kg)    Physical Exam  Constitutional: She appears well-developed and well-nourished. No distress.  Well-appearing,  comfortable, cooperative  HENT:  Mouth/Throat: Oropharynx is clear and moist.  Genitourinary:  Normal external female genitalia without evidence of any atrophy or sclerosis, no lesions. Vaginal canal without lesions. Cervix not fully visualized. No discharge on exam. Deferred bimanual exam.  Neurological: She is alert.  Skin: Skin is warm and dry. No rash noted. She is not diaphoretic.  Nursing note and vitals reviewed.  Pelvic exam was chaperoned Leonia Corona CMA     Assessment & Plan:   Problem List Items Addressed This Visit    Loss of weight    Likely secondary to reduced appetite and difficulty with meal preparation/planning. See details under Dementia A&P. No other significant labs needed, recently had extensive panel 07/2015, including normal Albumin 4.4 - Family support changes for meals - Start Ensure trial - Future consider Mirtazapine      Dementia with behavioral disturbance (Chronic)    Likely contributing to her recent weight loss, with some poor appetite and difficulty with meal preparation/planning. Suspect mild-mod dementia, seems Fast Stage 4-5 (some difficulty with IADLs, but some ADLs mostly intact). See Neurology / Psych evals for details on Dementia. Recent labs 07/2015, NM Pet scan suggestive Alzheimer's  Plan: 1. Emphasized importance of advanced meal planning with patient and her sister, to make readily available food that she can eat to avoid missed meals, also strongly encouraged having Ensure available to trial at least once daily between meals to see if improves wt gain 2. Discussed future options  with possible Mirtazapine, may help sleep and wt gain 3. Recommended scheduling Geriatrics Clinic visit with Dr McDiarmid - discussed PACE briefly, sister had looked into this but did not want to proceed       Other Visit Diagnoses    Vaginitis and vulvovaginitis    -  Primary    Per patient, now resolved. Exam normal externally (no lichen sclerosis or  atrophy). No discharge. Wet prep negative. Avoid irritating soaps.    Relevant Orders    POCT Wet Prep [facility]) (Completed)       No orders of the defined types were placed in this encounter.      Follow up plan: Return in about 2 months (around 11/28/2015) for dementia.  Gabriela Burke, [place], PGY-3

## 2015-09-28 NOTE — Assessment & Plan Note (Signed)
Likely contributing to her recent weight loss, with some poor appetite and difficulty with meal preparation/planning. Suspect mild-mod dementia, seems Fast Stage 4-5 (some difficulty with IADLs, but some ADLs mostly intact). See Neurology / Psych evals for details on Dementia. Recent labs 07/2015, NM Pet scan suggestive Alzheimer's  Plan: 1. Emphasized importance of advanced meal planning with patient and her sister, to make readily available food that she can eat to avoid missed meals, also strongly encouraged having Ensure available to trial at least once daily between meals to see if improves wt gain 2. Discussed future options with possible Mirtazapine, may help sleep and wt gain 3. Recommended scheduling Geriatrics Clinic visit with Dr McDiarmid - discussed PACE briefly, sister had looked into this but did not want to proceed

## 2015-10-01 MED ORDER — DIVALPROEX SODIUM ER 500 MG PO TB24: 500.0000 mg | ORAL_TABLET | Freq: Every day | ORAL | Status: DC

## 2015-10-01 MED ORDER — DONEPEZIL HCL 5 MG PO TABS: 5.0000 mg | ORAL_TABLET | Freq: Every day | ORAL | Status: DC

## 2015-10-01 MED ORDER — SIMVASTATIN 40 MG PO TABS: 40.0000 mg | ORAL_TABLET | Freq: Every day | ORAL | Status: DC

## 2015-10-01 NOTE — Telephone Encounter (Signed)
Will refill her a few of her medications at a time instead of all at once.   [name], MD PGY-3, [facility] 10/01/2015, 9:25 AM

## 2015-10-22 ENCOUNTER — Ambulatory Visit (INDEPENDENT_AMBULATORY_CARE_PROVIDER_SITE_OTHER): Payer: Medicare Other | Admitting: Sports Medicine

## 2015-10-22 ENCOUNTER — Encounter: Payer: Self-pay | Admitting: Sports Medicine

## 2015-10-22 DIAGNOSIS — M79672 Pain in left foot: Secondary | ICD-10-CM

## 2015-10-22 DIAGNOSIS — B351 Tinea unguium: Secondary | ICD-10-CM

## 2015-10-22 DIAGNOSIS — M79671 Pain in right foot: Secondary | ICD-10-CM | POA: Diagnosis not present

## 2015-10-22 DIAGNOSIS — E119 Type 2 diabetes mellitus without complications: Secondary | ICD-10-CM

## 2015-10-22 NOTE — Progress Notes (Signed)
Patient ID: Gabriela Burke, female   DOB: [birth year], 65 y.o.   MRN: [id number]  Subjective: Gabriela Burke is a 65 y.o. female patient with history of type 2 diabetes who presents to office today complaining of long, painful nails  while ambulating in shoes; unable to trim. Patient states that the glucose reading this morning was not recorded. Patient denies any new changes in medication or new problems. Patient denies any new cramping, numbness, burning or tingling in the legs.  Patient is assisted by sister.   Patient Active Problem List   Diagnosis Date Noted  . Loss of weight 09/28/2015  . Distal radial fracture 07/19/2015  . Fracture of ulnar styloid 06/26/2015  . Right leg pain 06/07/2015  . Lesion of ala of nose 01/03/2015  . Dementia with behavioral disturbance 08/08/2014  . Pituitary incidentaloma ([place]) 04/23/2014  . Dementia 12/18/2010  . Pre-diabetes 11/16/2006  . HYPERCHOLESTEROLEMIA 08/06/2006  . HYPERTENSION, BENIGN SYSTEMIC 08/06/2006   Current Outpatient Prescriptions on File Prior to Visit  Medication Sig Dispense Refill  . divalproex (DEPAKOTE ER) 500 MG 24 hr tablet Take 1 tablet (500 mg total) by mouth daily. 30 tablet 1  . donepezil (ARICEPT) 5 MG tablet Take 1 tablet (5 mg total) by mouth at bedtime. 30 tablet 1  . hydrochlorothiazide (HYDRODIURIL) 12.5 MG tablet Take 1 tablet (12.5 mg total) by mouth daily. Take 1 tab by mouth every morning 30 tablet 5  . HYDROcodone-acetaminophen (NORCO/VICODIN) 5-325 MG tablet Take 1 tablet by mouth every 4 (four) hours as needed. 6 tablet 0  . ketoconazole (NIZORAL) 2 % cream Apply 1 application topically daily. (Patient not taking: Reported on 07/26/2015) 30 g 0  . lisinopril (PRINIVIL,ZESTRIL) 10 MG tablet Take 1 tablet (10 mg total) by mouth daily. 30 tablet 5  . simvastatin (ZOCOR) 40 MG tablet Take 1 tablet (40 mg total) by mouth at bedtime. 30 tablet 1   No current facility-administered medications on file  prior to visit.   Allergies  Allergen Reactions  . Metformin And Related Nausea Only    GI upset   . Latex Other (See Comments)    unknown     Objective: General: Patient is awake, alert, and oriented,in no acute distress. Hx of Dementia with behavior disturbance.  Integument: Skin is warm, dry and supple bilateral. Nails are tender, long, thickened and  dystrophic with subungual debris, consistent with onychomycosis, 1-5 bilateral. No signs of infection. No open lesions or preulcerative lesions present bilateral. Remaining integument unremarkable.  Vasculature:  Dorsalis Pedis pulse 2/4 bilateral. Posterior Tibial pulse  1/4 bilateral.  Capillary fill time <3 sec 1-5 bilateral. Positive hair growth to the level of the digits. Temperature gradient within normal limits. No varicosities present bilateral. No edema present bilateral.   Neurology: The patient has intact sensation measured with a 5.07/10g Semmes Weinstein Monofilament at all pedal sites bilateral . Vibratory sensation intact bilateral with tuning fork. No Babinski sign present bilateral.   Musculoskeletal: No gross pedal deformities noted bilateral. Muscular strength 5/5 in all lower extremity muscular groups bilateral without pain or limitation on range of motion . No tenderness with calf compression bilateral.  Assessment and Plan: Problem List Items Addressed This Visit    None    Visit Diagnoses    Dermatophytosis of nail    -  Primary    Diabetes mellitus without complication (HCC)        Foot pain, bilateral          -  Examined patient. -Discussed and educated patient on diabetic foot care, especially with  regards to the vascular, neurological and musculoskeletal systems.  -Stressed the importance of good glycemic control and the detriment of not  controlling glucose levels in relation to the foot. -Mechanically debrided all nails 1-5 bilateral using sterile nail nipper and filed with dremel without incident   -Recommend daily skin creams from dry skin bilateral -Answered all patient questions -Patient to return in 3 months for at risk foot care -Patient advised to call the office if any problems or questions arise in the  Meantime.  Landis Martins, DPM

## 2015-10-23 ENCOUNTER — Telehealth: Payer: Self-pay | Admitting: *Deleted

## 2015-10-23 ENCOUNTER — Ambulatory Visit: Payer: Medicare Other | Admitting: Neurology

## 2015-10-23 NOTE — Telephone Encounter (Signed)
no showed f/u 

## 2015-10-24 ENCOUNTER — Encounter: Payer: Self-pay | Admitting: Neurology

## 2015-10-26 ENCOUNTER — Encounter: Payer: Self-pay | Admitting: Psychology

## 2015-10-26 NOTE — Progress Notes (Signed)
Patient ID: Gabriela Burke, female   DOB: 10-24-1950, 65 y.o.   MRN: [id number]  Health and Well-being  1. Physical health needs: Type II Diabetes; dementia ([facility] indicated that her PET scan was consistent with Alzheimer's dementia)  2. Physical health problems impacting mental wellbeing?: Dementia is influencing emotional well-being. Patient is frustrated at her memory loss.  3. Lifestyle impacting mental wellbeing?: Patient is reportedly hoarding food and garbage in her home, per sister.   4. Other concerns re: mental wellbeing?: Patient has history of anxiety and possible paranoia. Has family history of schizophrenia (patient's brother)   Social Environment  1. Home environment -- safety and stability?: Patient was living alone, but currently living with nephew. [name] reports that patient exhibits hoarding behavior, and that mice and rats live in her home because she leaves food lying around. There is also concern that patient is using a space heater, which may cause a fire if there is trash lying around.  2. Daily activities impacting wellbeing?: Patient may be relatively inactive around the house, but it would likely be dangerous for her to leave her home by herself (e.g., she broke her arm earlier this year, and states she does not remember how). It may be beneficial to the patient if someone could help her engage in mild physical activity (e.g., walking around her neighborhood) on a daily basis.  3. Social Network?: The patient's sister is her primary caregiver. I met with patient and her sister in January 2017, and noticed that patient's sister was very argumentative and disparaging in her remarks about the patient. I had to threaten to end the appointment if they could not stop aggressively yelling at each other. This is consistent with other reports in the chart; multiple providers indicated that patient and her sister argue during  appointments. [name] may have caregiver burnout, but exhibited an unusual level of frustration and anger directed at the patient. The patient complained that her sister does not allow her to attempt to be independent in any way, and that she feels as though she is being treated like a child, which she finds degrading. The patient's sister was not receptive to these complaints at all, and stated that the patient was just trying to "gain sympathy". Other providers have also mentioned that the patient is distrustful of her sister, and that she frequently avoids taking prescribed medication at the direction of her sister. On several occassions, the sister has attempted to have the patient involuntarily placed in an assisted living facility; however, she rarely follows up with recommendations for initiating competency evaluations.    Overall, patient's sister may be poorly equipped to help the patient with her difficulties, which may contribute to resistance by the patient to medical care. Although the patient certainly has cognitive deficits and likely requires a caregiver to facilitate medical care, I recommend that, to the extent possible, the role of the patient's sister be de-emphasized during the patient's medical appointments. For example, I would recommend speaking to the patient privately at first, and then allowing the sister to provide history and assist with implementing recommendations. This may help alleviate some patient's concerns that she is being dismissed. I also recommend that the patient's sister be given information about caregiver burnout, and receive referrals for counseling. Given the sister's level of frustration and the patient's resistance to the sister's help, this situation seems as though there is potential for abuse or neglect of a disabled person, and counseling may help reduce  the probability of such abuse occurring. Although patient does not wish to be placed in an assisted  living facility, she would likely benefit from another competency evaluation and placement in such a facility, if she is not deemed to be competent to care for herself.   4. Financial resources?: Currently, the patient's sister is helping financially support her, although in January 2017 she reported that she felt unable to do so for much longer.   Health Literacy and Communication  1. Understand their health and wellbeing?: Patient reports being unable to recall basic information, such as how she broke her arm. She also frequently forgets to take her medication.   2. Engage in healthcare discussions?: Patient can engage in healthcare discussions, but it is unclear if she is able to remember information when she returns home.   Does the patient have a firm diagnosis (i.e., established through assessment)? Patient has clearly documented decline in cognitive status using the MMSE. Patient's PET scan conducted by [facility] was deemed to be consistent with Alzheimer's pathology.  What psychotropic medications does the patient currently take? Divalproex, donepezil.  Psychotropic medication history: Patient took fluoxetine, prescribed at [facility], from 2013 - 2017. Patient also took tramadol for a brief (approximately 2 week) period in 2013.   What is the patient's medical utilization (e.g., how frequently are they being seen and by whom?)  Patient being seen at [facility], mostly by PCP, on a monthly basis. Also being seen by neurology for assessment of neuropathology. Has seen podiatry once in recent past.

## 2015-11-20 ENCOUNTER — Encounter: Payer: Self-pay | Admitting: Neurology

## 2015-11-20 ENCOUNTER — Ambulatory Visit (INDEPENDENT_AMBULATORY_CARE_PROVIDER_SITE_OTHER): Payer: Medicare Other | Admitting: Neurology

## 2015-11-20 VITALS — BP 109/68 | HR 76 | Ht 64.0 in | Wt 141.0 lb

## 2015-11-20 DIAGNOSIS — F0391 Unspecified dementia with behavioral disturbance: Secondary | ICD-10-CM

## 2015-11-20 DIAGNOSIS — F03918 Unspecified dementia, unspecified severity, with other behavioral disturbance: Secondary | ICD-10-CM

## 2015-11-20 NOTE — Patient Instructions (Signed)
Remember to drink plenty of fluid, eat healthy meals and do not skip any meals. Try to eat protein with a every meal and eat a healthy snack such as fruit or nuts in between meals. Try to keep a regular sleep-wake schedule and try to exercise daily, particularly in the form of walking, 20-30 minutes a day, if you can.   I would like to see you back in 6 months, sooner if we need to. Please call us with any interim questions, concerns, problems, updates or refill requests.   Our phone number is [phone]. We also have an after hours call service for urgent matters and there is a physician on-call for urgent questions. For any emergencies you know to call 911 or go to the nearest emergency room   

## 2015-11-20 NOTE — Progress Notes (Signed)
[id number]    Provider:  Dr Jaynee Eagles Referring Provider: [name], MD Primary Care Physician:  Clearance Coots, MD  CC: dementia with behavioral disturbances  Interval History 11/20/2015: 65 year old with progressed Alzheimer's Dementia. 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 Sister tries to help but patient has behavioral episodes, refuses medications. Patient is always agitated when she is here, nothing changed today. Sister tries very hard. Discussed sister's situation, there is caregiver burnout. Sister has pictures of patient's house, dirty and cluttered appears very unsafe. Reviewed in their medicine brain PET scan consistent with Alzheimer's type metabolic pathology. Patient says she knows she has dementia. Patient did not want to move from the home. Sr. is in the process of obtaining guardianship.  Addendum 08/15/2015: NM Brain PET: 1. Biparietal and bitemporal cortical hypometabolism is consistent with Alzheimer's type metabolic pathology. 2. Normal occipital lobe activity would be inconsistent with dementia with Lewy bodies. 3. Normal frontal lobe activity would be inconsistent with frontotemporal dementia.   Interval history 07/26/2015; Patient with significant history of memory loss, behavioral disturbances, probable psychiatric disease. Most history from sister. Patient has been diagnosed with dementia with behavioral disturbances. There appears to psychiatric component as well and unsure how much the memory loss and behavior is due to psychiatric causes vs a dementing process. MRi of the brain was unremarkable in 2015 and given the level of memory impairment will repeat MRi of the brain and see if there is progressive atrophy or any other changes we would expect. At this point we would likely see at least some atrophy given the level of memory loss. If mRI of the brain is still largely unremarkable appearing, will order a PET scan and also needs psychiatric  evaluation. Neurocognitive testing would be helpful if patient would agree. There is a social situation and sister tries to care for her and patient is very angry, agitated, disorganized. Patient screams and hollars all the time per sister, patient leaves cat food around the house and it is attracting rats. Patient is refusing all her medications. Patient pretends that she takes medications and sister finds the pills around the house. Sister manages all the finances. No significant progression in memory loss since last being seen, patient is stable per sister and would expect progressive decline if there is a dementing process. Patient has been referred to psychiatry in the past and did not go. Patient will not take a bath per sister. Brother has schizophrenia. Dementia in the mother. Appears Dr. Raeford Razor has called adult protective services recently as noted in the chart  Sister report: Needed to speak with sister separately as they are fighting: Per sister; patient is hallucinating. Sister is POA. . Patient is having delusions and hallucinations. She asks her sister where the people went and sister says there were no people around. Patient thinks people are stealing her money. She was seeing people who were not there. Her house is a mess. Sister called adult protective services and they did not come. Patient hurt her leg, sister doesn't know what happened. Patient doesn't use the stove, she just puts everything in the microwave. Patient had a fall but unknown why. Patient is a Ship broker.   Patient report: she says she cannot perform everyday tasks. She says no one is giving her medication. She does not know what medications she is supposed to take. She says she doesn't t have a problem taking meds but she has a problem with people asking her about the  meds. Then she says someone else gives her medications. Patient says her sister does all her bills. Patient says she can't take care of her bills. Patient  says she feels very paranoid about people taking her money. Patient says she wants to scream all the time. Patient says she sits on the porch with her cats all day. She denies being a Ship broker.   Psychiatric medications taken in the past per chart review: Depakote, Prozac,   HPI original visit 08/08/2014: KHADEDRA LETSINGER is a 65 y.o. female with a PMHxof HTN, HLD who is here as a referral from Dr. Raeford Razor for dementia. She is here with sister who provides much of the history as patient is apoor historian. review of notes shows that she has scored an 8/10 on a previous MoCA, sister is trying to care for patient but patient refuses medications, patient's house is reportedly out of order, and sounds like patient is adversarial to her sister and not ready to accept that she needs help. Patient is here with sister today and patient is agitated towards sister. Sister says she is just trying to help and remains calmer. Patient readily admits she has dementia and reports that she doesn't remember the things she should. Patient says she gets agitated and says she knows very well that she has dementia. Patient says her memory loss started long afo, and she is aware. Patient says that she never thought that she would have dementia, she has tried to keep herself moving all her life and is very upset by her condition. Patient knows she has lost her memories on certain things. Patient gets upset because she can't find things and she won't always be herself. Patient lives alone is in the house. Sister says patient is very angry about what is happening to her and sister is agitated often. Sister doesn't want to get sick and is tired of being yelled at. Sister is trying to support the patient. Sister has a full time job as well. Patient is not taking her medicine. Denies hallucinations. Sister is very concerned about her sister and is trying to care for her, appears very caring. Patient tears up today about her situation.     Reviewed notes, labs and imaging from outside physicians, which showed:  TSH 1/206 WNL,  Recent labs: BMP WNL, HgbA1c 6.2,  B12 in 07/2012 500 RPR 07/2012 NR   MRi of the brain 2015 (reviewed images and agree with results below) IMPRESSION: 1. The pituitary gland is enlarged in the midline, measuring up to 9.5 mm on the sagittal images. Correlate with hormonal testing. 2. No significant distortion of the cavernous sinus or optic chiasm. 3. Otherwise unremarkable MRI of the brain.    Review of Systems: Patient complains of symptoms per HPI as well as the following symptoms: cramps, feeling hot, feeling cold. Pertinent negatives per HPI. All others negative.   Social History   Social History  . Marital Status: Single    Spouse Name: N/A  . Number of Children: 0  . Years of Education: 12   Occupational History  . Not on file.   Social History Main Topics  . Smoking status: Never Smoker   . Smokeless tobacco: Not on file  . Alcohol Use: No  . Drug Use: No  . Sexual Activity: Not on file   Other Topics Concern  . 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     Caffeine: occassional  use    Family History  Problem Relation Age of Onset  . Schizophrenia Brother   . Stroke Brother   . Stroke Brother   . Dementia Mother     Past Medical History  Diagnosis Date  . HTN (hypertension)   . HLD (hyperlipidemia)   . DM (diabetes mellitus) ([place])     Past Surgical History  Procedure Laterality Date  . Rotator cuff repair    . Total abdominal hysterectomy      Current Outpatient Prescriptions  Medication Sig Dispense Refill  . divalproex (DEPAKOTE ER) 500 MG 24 hr tablet Take 1 tablet (500 mg total) by mouth daily. (Patient not taking: Reported on 11/20/2015) 30 tablet 1  . donepezil (ARICEPT) 5 MG tablet Take 1 tablet (5 mg total) by mouth at bedtime. (Patient not taking: Reported on 11/20/2015) 30 tablet 1  .  hydrochlorothiazide (HYDRODIURIL) 12.5 MG tablet Take 1 tablet (12.5 mg total) by mouth daily. Take 1 tab by mouth every morning (Patient not taking: Reported on 11/20/2015) 30 tablet 5  . HYDROcodone-acetaminophen (NORCO/VICODIN) 5-325 MG tablet Take 1 tablet by mouth every 4 (four) hours as needed. (Patient not taking: Reported on 11/20/2015) 6 tablet 0  . ketoconazole (NIZORAL) 2 % cream Apply 1 application topically daily. (Patient not taking: Reported on 07/26/2015) 30 g 0  . lisinopril (PRINIVIL,ZESTRIL) 10 MG tablet Take 1 tablet (10 mg total) by mouth daily. (Patient not taking: Reported on 11/20/2015) 30 tablet 5  . simvastatin (ZOCOR) 40 MG tablet Take 1 tablet (40 mg total) by mouth at bedtime. (Patient not taking: Reported on 11/20/2015) 30 tablet 1   No current facility-administered medications for this visit.    Allergies as of 11/20/2015 - Review Complete 11/20/2015  Allergen Reaction Noted  . Metformin and related Nausea Only 12/08/2013  . Latex Other (See Comments) 05/08/2008    Vitals: BP 109/68 mmHg  Pulse 76  Ht 5\' 4"  (1.626 m)  Wt 141 lb (63.957 kg)  BMI 24.19 kg/m2 Last Weight:  Wt Readings from Last 1 Encounters:  11/20/15 141 lb (63.957 kg)   Last Height:   Ht Readings from Last 1 Encounters:  11/20/15 5\' 4"  (1.626 m)   Speech:  No dysarthria, no dysphagia Cognition:  The patient is oriented to person, city  recent and remote memory impaired;   language fluent;   impaired attention, impaired concentration,   fund of knowledge impaired  MMSE - Mini Mental State Exam 07/26/2015  Orientation to time 0  Orientation to Place 4  Registration 3  Attention/ Calculation 0  Recall 0  Language- name 2 objects 2  Language- repeat 1  Language- follow 3 step command 1  Language- read & follow direction 1  Write a sentence 1  Copy design 1  Total score 14   Cranial Nerves:  The pupils are equal, round, and reactive to light. The fundi are flat.  Visual fields are full to finger confrontation. Extraocular movements are intact. Trigeminal sensation is intact and the muscles of mastication are normal. The face is symmetric. The palate elevates in the midline. Hearing intact. Voice is normal. Shoulder shrug is normal. The tongue has normal motion without fasciculations.   Coordination:  Normal finger to nose and heel to shin.   Gait:  No ataxia  Motor Observation:  No asymmetry, no atrophy, and no involuntary movements noted. Tone:  Normal muscle tone.   Posture:  Posture is normal. normal erect   Strength:  Strength is V/V  in the upper and lower limbs.    Sensation: intact to LT   Reflex Exam:  DTR's:  Deep tendon reflexes in the upper and lower extremities are brisk bilaterally.  Toes:  The toes are downgoing bilaterally.  Clonus:  Clonus is absent.  No frontal release signs   Assessment/Plan: 65 year old female with memory loss, behavioral disturbances, agitation, disorganized thoughts, tangential and difficult to redirect. Doesn't understand that not taking her medications may cause problems. . Here with sister. Neuro exam non-focal. MoCA 10/30 and MMSE 14/30. She has been diagnosed with dementia with behavioral disturbances. Imaging was consistent with Alzheimer's dementia (see above HPI)  Today's history and physical demonstrated very substantial and measurable cognitive dysfunction. It is very clear she does not have the capacity to make informed and appropriate decisions on her healthcare and finances. It is clear that patient does not comprehend the degree of risks that she may be in with reported fall, reported neglect within the home and medication non-compliance.   - Psychiatry referred for psychosis. Dr. Norma Fredrickson she saw. She refuses medications. - Alzheimer's Demenitaprogressed with significant behavioral disturbances and agitation - Extensive lab testing completed:  TSH,RPR,HIV,B12 and folate, ammonia, cbc, cmp, heavy metals, ana, pth, lyme.  - continue aricept patient not taking his - Started Depakote at last appointments for agitation but do not believe patient is taking it.  - Have advised sister that she should consider pursuing competency evaluation and memory care unit for patient - Appears Dr. Raeford Razor and sister have called adult protective services in the past as noted in the chart b - Would add namenda 5mg  twice daily, but patient declines and refuses, sister finds pills all over the house  Addendum 08/15/2015: NM Brain PET: 1. Biparietal and bitemporal cortical hypometabolism is consistent with Alzheimer's type metabolic pathology. 2. Normal occipital lobe activity would be inconsistent with dementia with Lewy bodies. 3. Normal frontal lobe activity would be inconsistent with frontotemporal dementia.  Sarina Ill, MD  [facility] [address] [place], [place] [address]  Phone [phone] Fax [phone]   A total of 30 minutes was spent in with this patient and sister face-to-face. Over half this time was spent on counseling patient on the dementia vs psychiatric disturbance diagnosis and different therapeutic options available.

## 2015-11-21 ENCOUNTER — Telehealth: Payer: Self-pay | Admitting: *Deleted

## 2015-11-21 NOTE — Telephone Encounter (Signed)
Pt office note, at the front desk for pick up.

## 2015-12-05 ENCOUNTER — Telehealth: Payer: Self-pay | Admitting: *Deleted

## 2015-12-05 NOTE — Telephone Encounter (Signed)
Pt Long Term Care FL2 form on [place].

## 2015-12-06 ENCOUNTER — Other Ambulatory Visit: Payer: Self-pay

## 2015-12-06 NOTE — Telephone Encounter (Signed)
Received form today from medical records.

## 2015-12-18 ENCOUNTER — Telehealth: Payer: Self-pay | Admitting: Licensed Clinical Social Worker

## 2015-12-18 ENCOUNTER — Telehealth: Payer: Self-pay | Admitting: *Deleted

## 2015-12-18 NOTE — Telephone Encounter (Signed)
Social worker informed by front office that patient's sister called requesting help with an FL2.  Return call to patient's sister Ms. Lovena Le, [place].  States she has taken the [facility] to patient's Neurologist and is waiting for them to call her to pick it up.  She does not know what to do next.  The following was discussed: FL2, [employer] services, Medicaid application, and  facility placement.  Plan: Ms Elonda Husky will call neurologist to pick up FL2, call [employer] once she has the [facility], call DSS to find out if the [facility] application taken was for Long-term care.  Sister will call LCSW this week to provide an update and discuss any barriers.   Casimer Lanius, LCSW Licensed Clinical Social Worker [place]   [phone] 11:05 AM

## 2015-12-18 NOTE — Telephone Encounter (Signed)
Gave completed/singed by AA, MD paperwork back to medical records.

## 2015-12-18 NOTE — Telephone Encounter (Signed)
Pt p/u fl2 form on 12/18/2015.

## 2016-01-15 ENCOUNTER — Telehealth: Payer: Self-pay | Admitting: Licensed Clinical Social Worker

## 2016-01-15 NOTE — Progress Notes (Signed)
Call from patient's sister  Ms. Gabriela Burke [id number] to inform social worker that patient has a guardianship hearing  on Aug. 16th.  Sister states she is not interested in becoming the guardian and will contact DSS to inform them about the hearing and that she does not wish to be the guardian.  Per Ms. Gabriela Burke she has taken patient to [facility] and other day programs.  Patient does not want to attend and participate in any of the programs.  Plan:  [name] will contact the guardianship social worker at [place], continue to assist caring for patient until the hearing on Aug. 16th and follow up CSW with hearing results after Aug. 16th.  Casimer Lanius, LCSW Licensed Clinical Social Worker [place]   [phone] 9:11 AM

## 2016-01-21 ENCOUNTER — Telehealth: Payer: Self-pay | Admitting: Neurology

## 2016-01-21 DIAGNOSIS — F03918 Unspecified dementia, unspecified severity, with other behavioral disturbance: Secondary | ICD-10-CM

## 2016-01-21 DIAGNOSIS — F0391 Unspecified dementia with behavioral disturbance: Secondary | ICD-10-CM

## 2016-01-21 NOTE — Telephone Encounter (Signed)
Unfortunately I don't know what can be done next for patient , patient needs to be connected with social work. Gabriela Burke can you see if we have social workers in [place] that we can refer patient to?

## 2016-01-21 NOTE — Telephone Encounter (Signed)
Dr Jaynee Eagles- do you have any suggestions?

## 2016-01-21 NOTE — Telephone Encounter (Signed)
Gabriela Burke- here is the phone note you requested, thank you  Spoke to [name], referral coordinator. She is going to speak with [name] at [facility] and see how to place referral for social work. We need another service besides social work for pt to qualify. Gabriela Burke is going to check into this tomorrow with [name].

## 2016-01-21 NOTE — Telephone Encounter (Signed)
Spoke with the patient's sister and she stated that they had filled out some papers (FL2). She states that her sister needs memory care according to Dr. Jaynee Eagles. She says that they cannot get memory care for the patient but that "they" wanted to put her in a nursing home because apparently the patient makes too much money to qualify for memory care. She is very confused by this and has requested to speak with someone regarding what can be done next. Patient's sisters name is [name] (listed on DPR) and her phone number is [phone].

## 2016-01-22 NOTE — Telephone Encounter (Signed)
Called and spoke to [place] with [facility] [phone] and he relayed place order for Nursing / Social work. Emma Please let me know when order is placed do I can call Drew. Thanks Hinton Dyer.

## 2016-01-22 NOTE — Telephone Encounter (Signed)
Called and left message for Gabriela Burke,Gabriela Burke to call me when she is available. I want to speak to her about Memory Care unit. Thanks Hinton Dyer.

## 2016-01-22 NOTE — Telephone Encounter (Signed)
Placed order for home health/nursing/social work per Dr Jaynee Eagles request. [name] is aware. She is going to contact [facility] to get patient set up.   Called and spoke to sister. Advised per Dr Jaynee Eagles that we can place order for [facility] for social work/nursing for them to come out and evaluate pt. She stated that she has already done this. She had a hearing via court system and social services came out and evaluated situation and know she should not be at home alone. However, she states she does not qualify for memory care unit d/t her income being too high. She makes 80 dollars too much. She was told her sister would have to go to a nursing home. She is concerned because she wanted to have her in the memory care. She has a court hearing tomorrow at 1pm for incompetency for pt. She is unsure where to go from here. I recommended she can look around and see which nursing homes would best fit pt needs. Sister states she has done this already and was told they do not provide memory care. I advised I will speak with Dr Jaynee Eagles and call back to let her know what her suggestions are.  I got a hold of Hinton Dyer C, and she is going to call pt and discuss with her as well/give recommendations.

## 2016-01-23 ENCOUNTER — Telehealth: Payer: Self-pay | Admitting: Neurology

## 2016-01-23 NOTE — Telephone Encounter (Signed)
Mrs. Elonda Husky called in this morning stating that she called in yesterday and spoke with the nurse about the pt going to court today at 1p. She is going to court to determine if she should be placed in a nursing facility and where. Mrs. Elonda Husky said she called for advise yesterday. She would like a call back to discuss her options for the pt. [phone]

## 2016-01-23 NOTE — Telephone Encounter (Signed)
See other phone note from today. "Called and spoke to [place]. Advised she can contact pt insurance company and find out which nursing facilities would be covered/in network. She verbalized understanding and appreciation"

## 2016-01-23 NOTE — Telephone Encounter (Signed)
Called and spoke to [place]. Advised she can contact pt insurance company and find out which nursing facilities would be covered/in network. She verbalized understanding and appreciation.

## 2016-01-24 ENCOUNTER — Telehealth: Payer: Self-pay | Admitting: Licensed Clinical Social Worker

## 2016-01-24 NOTE — Progress Notes (Signed)
Call from patient's sister G. Elonda Husky, informed CSW she was awarded guardianship of patient by the courts during the hearing yesterday and will be working to place patient in a memory care facility.  The following was discussed:  process for facility placement, FL2, and locating a facility.  [name] was appreciative of assist from [place].  Plan: [name] will provide a copy of the guardianship papers once she receives them next week, she will come to [facility] office today to pick up a list of facilities and contact CSW for assistance with placement once she locates the facility.  Casimer Lanius, LCSW Licensed Clinical Social Worker [place]   [phone] 9:35 AM

## 2016-01-28 ENCOUNTER — Telehealth: Payer: Self-pay | Admitting: Neurology

## 2016-01-28 ENCOUNTER — Ambulatory Visit: Payer: Medicare Other | Admitting: Sports Medicine

## 2016-01-28 NOTE — Telephone Encounter (Signed)
Called Danielle back at [facility]. Gave verbal ok per Dr Jaynee Eagles for them to go out to evaluate pt. She verbalized understanding. She took my name and title.

## 2016-01-28 NOTE — Telephone Encounter (Signed)
That's fine. thanks

## 2016-01-28 NOTE — Telephone Encounter (Signed)
Danielle/[facility] called for orders- pt has been admitted for services. Social worker is being brought in . Verbal - stating physician is ok with [facility] going into the home. May call [phone]-please make sure to leave your name( physician ) .

## 2016-01-28 NOTE — Telephone Encounter (Signed)
Dr Jaynee Eagles- are you ok for me to give verbal ok?

## 2016-01-29 ENCOUNTER — Telehealth: Payer: Self-pay | Admitting: Licensed Clinical Social Worker

## 2016-01-29 NOTE — Progress Notes (Signed)
Phone call from patient's sister Gabriela Burke, she picked up list of ALF left for her at the front desk. Inform CSW she has called several ALF but has not been able to locate a facility at this time.  Gabriela Burke is frustrated with this process. CSW provided emotional support and reviewed the placement process with her.  At this time she still has not received the guardianship papers.   Plan: Gabriela Burke will go apply for Special Assistance for memory care placement and will call DSS social worker Nicole Kindred for questions on guardianship. Will call CSW later this week to provide an update.   Casimer Lanius, LCSW Licensed Clinical Social Worker [facility]   [phone] 9:31 AM

## 2016-01-30 ENCOUNTER — Telehealth: Payer: Self-pay | Admitting: Licensed Clinical Social Worker

## 2016-01-30 NOTE — Progress Notes (Signed)
Call from [place] with [facility] [phone]. Patient also has an Therapist, sports.  LCSW with [facility] will continue to provide emotional support for patient and family.  Will also coordinate care with this CSW .  Casimer Lanius, LCSW Licensed Clinical Social Worker [facility]   [phone] 10:06 AM

## 2016-01-30 NOTE — Progress Notes (Signed)
Return call to patient's sister G. Elonda Husky [phone] hm; [phone] cell.  States patient was not eligible for Medicaid Special Assistance due to being over the income limit.  Sister will go back to court and has decided she will not be the guardian for patient due to not able to place patient.  Sister will allow DSS to become guardian over patient so that patient can be placed. New court date this week. Will call CSW with an update.  Also informed CSW that patient will have a nurse and social worker comes out twice a week for the next two weeks. States she provided name and phone number for this CSW for coordination of care.  Casimer Lanius, LCSW Licensed Clinical Social Worker [facility]   [phone] 8:52 AM

## 2016-02-04 ENCOUNTER — Ambulatory Visit: Payer: Medicare Other | Admitting: Sports Medicine

## 2016-02-05 ENCOUNTER — Telehealth: Payer: Self-pay | Admitting: Licensed Clinical Social Worker

## 2016-02-05 NOTE — Progress Notes (Signed)
Call from patient's sister Ms Elonda Husky, states patient has been deemed incompetent by the court last week. DSS guardianship worker Nicole Kindred [phone] is now assisting her with placement. LCSW informed patient's sister to share the [facility] with DSS, it has patient's level of care.   Call from [place] worker Ms. Radford Pax [phone].  States patient now has an open Adult Protective Services case. She will be working with patient's sister and guardianship worker to assist with placement. DSS will also be requesting a copy of patient's medical chart. Ms Radford Pax will fax the request.    Plan: [name] will continue working with DSS for patient's ongoing need for placement.   Casimer Lanius, LCSW Licensed Clinical Social Worker [place]   [phone] 12:11 PM

## 2016-02-18 ENCOUNTER — Encounter: Payer: Self-pay | Admitting: Family Medicine

## 2016-02-18 ENCOUNTER — Telehealth: Payer: Self-pay | Admitting: Internal Medicine

## 2016-02-18 ENCOUNTER — Ambulatory Visit (INDEPENDENT_AMBULATORY_CARE_PROVIDER_SITE_OTHER): Payer: Medicare Other | Admitting: Family Medicine

## 2016-02-18 VITALS — BP 128/69 | HR 79 | Temp 98.1°F | Wt 146.0 lb

## 2016-02-18 DIAGNOSIS — R451 Restlessness and agitation: Secondary | ICD-10-CM

## 2016-02-18 DIAGNOSIS — Z9114 Patient's other noncompliance with medication regimen: Secondary | ICD-10-CM | POA: Diagnosis not present

## 2016-02-18 DIAGNOSIS — F03918 Unspecified dementia, unspecified severity, with other behavioral disturbance: Secondary | ICD-10-CM

## 2016-02-18 DIAGNOSIS — F0391 Unspecified dementia with behavioral disturbance: Secondary | ICD-10-CM

## 2016-02-18 LAB — POCT URINALYSIS DIPSTICK
Bilirubin, UA: NEGATIVE
Glucose, UA: NEGATIVE
Ketones, UA: NEGATIVE
Leukocytes, UA: NEGATIVE
Nitrite, UA: NEGATIVE
Protein, UA: NEGATIVE
Spec Grav, UA: 1.02
Urobilinogen, UA: 0.2
pH, UA: 7

## 2016-02-18 LAB — POCT UA - MICROSCOPIC ONLY

## 2016-02-18 NOTE — Assessment & Plan Note (Addendum)
Patient is here with reports of significant increase in her agitation, and irritability. After looking through her chart patient seems to have a long history of irritability and agitation regarding her sister and caretaker. It is my belief that patient likely is responding negatively towards the caretaker due to a persistent degradation of their relationship. One significant issue that was noted during this visit is that patient has not been taking her medications regularly. According to the caretaker she is now taking her medications daily with the home health nurse but this is only been going on for the past 3 days. - Continue Depakote at current dose - Continue anterior set at current dose 1 month and then increase. - Strong encouragement towards emphasis on medication compliance. - UA obtained today to assess for UTI  - Nothing to suggest infection at this time. - I think in the future patient is going to require some type of adult daycare or placement within a treatment facility. I do not believe that her current relationship with her sister is very healthy for either party. I strongly encourage PCP to go over this with patient and caretaker.  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

## 2016-02-18 NOTE — Telephone Encounter (Signed)
Pt just started taking divalproex about 3 days ago. "she is now acting the fool"  She doesn't comprehend anything.  This is per her sister.  Ms Gabriela Burke wonders if the medicine could have the reverse effect.  Please advise.

## 2016-02-18 NOTE — Patient Instructions (Signed)
It was a pleasure seeing you today in our clinic. Today we discussed her agitation. Here is the treatment plan we have discussed and agreed upon together:   - I will strongly recommend continuing medications that she is currently on. - The donepezil requires a significant amount of time to develop in the body. - The Depakote may help with her agitation. - Come back and see Dr. Emmaline Life in 3-4 weeks.

## 2016-02-18 NOTE — Telephone Encounter (Signed)
Looks like patient came in for an appointment with Dr. Alease Frame today. Hopeful he can address her concerns.
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
## 2016-02-18 NOTE — Assessment & Plan Note (Signed)
Patient continues to have this issue. Compliance is improved with home health nurse over the past 3-4 days. - Strongly encouraged patient to make efforts towards improving this.

## 2016-02-19 ENCOUNTER — Ambulatory Visit: Payer: Medicare Other | Admitting: Sports Medicine

## 2016-02-21 ENCOUNTER — Other Ambulatory Visit: Payer: Self-pay | Admitting: Internal Medicine

## 2016-02-26 ENCOUNTER — Telehealth: Payer: Self-pay | Admitting: Licensed Clinical Social Worker

## 2016-02-26 NOTE — Progress Notes (Signed)
LCSW received completed FL2 from MD.  Call to patient's sister Mr. Elonda Husky to inform her FL2 is available for her to pick up.  Also called APS worker Ms Radford Pax to inform her patient's sister will pick up [facility] tomorrow.  LCSW provided emotional support to Ms. Turner as she continues to express concerns of not being able to find placement for patient. Sister will apply for LTC Medicaid for patient.   Plan: [name] [name] will pick up FL2 and list of LTC facilities.  She will contact APS work Ms. Turner for additional assistance with placement.   Casimer Lanius, LCSW Licensed Clinical Social Worker [facility]   [phone] 3:43 PM

## 2016-02-28 ENCOUNTER — Telehealth: Payer: Self-pay | Admitting: Licensed Clinical Social Worker

## 2016-02-28 NOTE — Progress Notes (Signed)
Returned call to patient's APS worker Junius Finner [phone], she has is requesting a copy of patient's FL2 and information to assist with placement. Previous request for patient's record has been faxed to [facility]. Informed APS worker LCSW is unable to fax the requested formation.   Plan: APS Supervisor Reva Bores came to [facility] office to pick up [facility] packet.  APS will assist patient's sister with placement and will follow up with LCSW if additional assistance is needed.   Casimer Lanius, LCSW Licensed Clinical Social Worker [place]   [phone] 11:43 AM

## 2016-03-03 ENCOUNTER — Telehealth: Payer: Self-pay | Admitting: *Deleted

## 2016-03-03 NOTE — Telephone Encounter (Signed)
Patient sister calling, she is having problems finding a nursing home for placement. Sister is saying that this is all getting to be to much for her to bear. States she tried [facility] and they told her they do not have a lockdown area. Says patient needs placement now. Wants to know if MD can write a letter to get her in someplace quicker. Patient sister sates she does not know what else to do. Will forward to PCP and Neoma Laming.

## 2016-03-04 NOTE — Telephone Encounter (Signed)
Thank you Ms. Gabriela Burke and [name]! I am covering for Dr. Emmaline Life. Let me know if there is anything I can do to be helpful.  [name]

## 2016-03-04 NOTE — Progress Notes (Signed)
Return call to patient's sister [name]. She took patient to visit [facility] facility and was informed by the social worker Tillie Rung that they could not take patient due to patient needing a locked unit.  States because patient does not want to be there they are concerned that she will leave the facility.  [name] states patient does not understand that she cannot care for herself and she is refusing to go to a facility.     LCSW provided emotional support and reflective listening with [name].  [name] states she is now at the  point that she understands patient will have to go to any facility that will take her.  Call to [place] [phone] social worker with [facility], [facility] (APS) for coordination of care, to provide additional  information on two other facilities with possible openings.  Plan: [place] with APS will follow up with the facilities and will touch basis with LCSW if additional assistance is needed for placement.   Casimer Lanius, LCSW Licensed Clinical Social Worker [place]   [phone] 9:46 AM

## 2016-03-06 ENCOUNTER — Telehealth: Payer: Self-pay | Admitting: Licensed Clinical Social Worker

## 2016-03-06 NOTE — Progress Notes (Signed)
Call from patient's sister Ms. Tyson, [facility] in [place] has agreed to take patient.  APS work has informed [name] to go to DSS to complete the Medication application for LTC.  She miss placed the FL2 after giving Yoe a copy and will come by [facility] office to pick up another copy.  Plan:  [name] picked up a copy of the FL2 and will go to the Department of Social Services today to complete the application for Long-term Care.   Casimer Lanius, LCSW Licensed Clinical Social Worker [place]   [phone] 10:47 AM

## 2016-03-11 ENCOUNTER — Ambulatory Visit (INDEPENDENT_AMBULATORY_CARE_PROVIDER_SITE_OTHER): Payer: Medicare Other | Admitting: Sports Medicine

## 2016-03-11 ENCOUNTER — Encounter: Payer: Self-pay | Admitting: Sports Medicine

## 2016-03-11 DIAGNOSIS — M79671 Pain in right foot: Secondary | ICD-10-CM

## 2016-03-11 DIAGNOSIS — M79672 Pain in left foot: Secondary | ICD-10-CM | POA: Diagnosis not present

## 2016-03-11 DIAGNOSIS — E119 Type 2 diabetes mellitus without complications: Secondary | ICD-10-CM

## 2016-03-11 DIAGNOSIS — B351 Tinea unguium: Secondary | ICD-10-CM

## 2016-03-11 NOTE — Progress Notes (Signed)
Patient ID: BUENA MCKELLER, female   DOB: [birth year], 65 y.o.   MRN: [id number]  Subjective: BRITLEE EASTERBROOK is a 65 y.o. female patient with history of type 2 diabetes who presents to office today complaining of long, painful nails  while ambulating in shoes; unable to trim. Patient states that the glucose reading this morning was not recorded. Feeling sad today. Patient denies any new changes in medication or new problems. Patient denies any new cramping, numbness, burning or tingling in the legs.  Patient history of Dementia with Behavior disturbance. Unassisted during this visit.   Patient Active Problem List   Diagnosis Date Noted  . Loss of weight 09/28/2015  . Distal radial fracture 07/19/2015  . Fracture of ulnar styloid 06/26/2015  . Right leg pain 06/07/2015  . Lesion of ala of nose 01/03/2015  . Dementia with behavioral disturbance 08/08/2014  . 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 Non compliance w medication regimen 03/21/2013  . Dementia 12/18/2010  . Pre-diabetes 11/16/2006  . HYPERCHOLESTEROLEMIA 08/06/2006  . HYPERTENSION, BENIGN SYSTEMIC 08/06/2006   Current Outpatient Prescriptions on File Prior to Visit  Medication Sig Dispense Refill  . divalproex (DEPAKOTE ER) 500 MG 24 hr tablet Take 1 tablet (500 mg total) by mouth daily. (Patient not taking: Reported on 11/20/2015) 30 tablet 1  . donepezil (ARICEPT) 5 MG tablet Take 1 tablet (5 mg total) by mouth at bedtime. (Patient not taking: Reported on 11/20/2015) 30 tablet 1  . hydrochlorothiazide (HYDRODIURIL) 12.5 MG tablet Take 1 tablet (12.5 mg total) by mouth daily. Take 1 tab by mouth every morning (Patient not taking: Reported on 11/20/2015) 30 tablet 5  . lisinopril (PRINIVIL,ZESTRIL) 10 MG tablet Take 1 tablet (10 mg total) by mouth daily. (Patient not taking: Reported on 11/20/2015) 30 tablet 5  . simvastatin (ZOCOR) 40 MG tablet Take 1 tablet (40 mg total) by mouth at bedtime. (Patient not taking:  Reported on 11/20/2015) 30 tablet 1   No current facility-administered medications on file prior to visit.    Allergies  Allergen Reactions  . Metformin And Related Nausea Only    GI upset   . Latex Other (See Comments)    unknown     Objective: General: Patient is awake, alert, and oriented,in no acute distress. Hx of Dementia with behavior disturbance.  Integument: Skin is warm, dry and supple bilateral. Nails are tender, long, thickened and  dystrophic with subungual debris, consistent with onychomycosis, 1-5 bilateral. No signs of infection. No open lesions or preulcerative lesions present bilateral. Remaining integument unremarkable.  Vasculature:  Dorsalis Pedis pulse 2/4 bilateral. Posterior Tibial pulse  1/4 bilateral.  Capillary fill time <3 sec 1-5 bilateral. Positive hair growth to the level of the digits. Temperature gradient within normal limits. No varicosities present bilateral. No edema present bilateral.   Neurology: The patient has intact sensation measured with a 5.07/10g Semmes Weinstein Monofilament at all pedal sites bilateral . Vibratory sensation intact bilateral with tuning fork. No Babinski sign present bilateral.   Musculoskeletal: No gross pedal deformities noted bilateral. Muscular strength 5/5 in all lower extremity muscular groups bilateral without pain or limitation on range of motion . No tenderness with calf compression bilateral.  Assessment and Plan: Problem List Items Addressed This Visit    None    Visit Diagnoses    Dermatophytosis of nail    -  Primary   Diabetes mellitus without complication (HCC)       Foot pain, bilateral         -  Examined patient. -Discussed and educated patient on diabetic foot care, especially with  regards to the vascular, neurological and musculoskeletal systems.  -Stressed the importance of good glycemic control and the detriment of not  controlling glucose levels in relation to the foot. -Mechanically debrided  all nails 1-5 bilateral using sterile nail nipper and filed with dremel without incident  -Recommend daily skin creams from dry skin bilateral -Answered all patient questions -Patient to return in 3 months for at risk foot care -Patient advised to call the office if any problems or questions arise in the  Meantime.  Landis Martins, DPM

## 2016-03-26 ENCOUNTER — Telehealth: Payer: Self-pay | Admitting: Licensed Clinical Social Worker

## 2016-03-26 NOTE — Progress Notes (Signed)
Call from [name] [name], she continues to run into barriers with placing patient in a Long term care facility.  [name] has started the Medicaid application.  [facility] in [place] has agreed to take patient.  [name] is now asking for a current FL2, the last FL2 was completed over 30 days ago.  MD has been notified that a new FL2 is needed in order to complete placement for patient.    Plan: LCSW will notify patient's sister Ms. Tyson when [facility] is ready.   Casimer Lanius, LCSW Licensed Clinical Social Worker [facility]   [phone] 3:14 PM

## 2016-03-27 NOTE — Progress Notes (Signed)
FL2 signed by Dr. Wendy Poet.  PASRR also completed.  Call to patient's sister [name] to inform her FL2 is ready for pick up.  Plan: FL2 left at front desk for patient's sister [name] to pick up.  Casimer Lanius, LCSW Licensed Clinical Social Worker [facility]   [phone] 10:07 AM

## 2016-04-03 ENCOUNTER — Telehealth: Payer: Self-pay | Admitting: Licensed Clinical Social Worker

## 2016-04-03 ENCOUNTER — Encounter: Payer: Self-pay | Admitting: Internal Medicine

## 2016-04-03 ENCOUNTER — Ambulatory Visit (INDEPENDENT_AMBULATORY_CARE_PROVIDER_SITE_OTHER): Payer: Medicare Other | Admitting: Internal Medicine

## 2016-04-03 VITALS — BP 130/66 | HR 68 | Temp 98.3°F | Wt 154.0 lb

## 2016-04-03 DIAGNOSIS — R7303 Prediabetes: Secondary | ICD-10-CM | POA: Diagnosis not present

## 2016-04-03 DIAGNOSIS — M25561 Pain in right knee: Secondary | ICD-10-CM | POA: Insufficient documentation

## 2016-04-03 DIAGNOSIS — Z23 Encounter for immunization: Secondary | ICD-10-CM | POA: Diagnosis not present

## 2016-04-03 LAB — POCT GLYCOSYLATED HEMOGLOBIN (HGB A1C): Hemoglobin A1C: 6.4

## 2016-04-03 MED ORDER — MELOXICAM 7.5 MG PO TABS: 7.5000 mg | ORAL_TABLET | Freq: Every day | ORAL | 0 refills | Status: DC

## 2016-04-03 NOTE — Progress Notes (Addendum)
Call from patient's sister [name], patient's guardian.   [facility] will no longer take patient due to flight risk.  This is the second facility that has refused patient when sister took her.  Review of placement options and Medicaid with [name].  LCSW provided her with names of two locked facilities for patient.  Sister visited facilities and decided that [facility] in [place] will meet patient's needs.  Sister is concerned that patient will "act out" as she does not want to go to the facility.   Call to [place] worker Latimer. Informed that patient's APS case has been closed.  She provided [name] guidance today and options in getting patient to the facility.    Plan:  [name] has the [facility], will take patient to the facility next week.    She will consult with APS worker [name] if additional guidance is still needed.    Casimer Lanius, LCSW Licensed Clinical Social Worker [place]   [phone] 11:17 AM

## 2016-04-03 NOTE — Assessment & Plan Note (Signed)
Did not have time to talk about diabetes - A1c ordered today - Follow-up with PCP

## 2016-04-03 NOTE — Progress Notes (Signed)
   [facility] Phone: [phone]  Subjective:  Gabriela Burke is a 65 year old female presenting to clinic for right knee pain for the last 3 weeks. She was going on a walk with her sister and she thinks she stepped wrong. Her sister noticed that she was walking with a limp after the walk. Pt states that the knee pain feels like a "hurt". The pain feels like it is located inside the knee. The pain is worse with walking. Nothing makes the pain better. She has not tried ice, heat, or any medications. The pain radiates to the upper part of her lower leg. She denies any swelling, warmth, clicking, popping, or locking. She denies fevers, chills, nausea, or vomiting.  ROS: See HPI for pertinent positives and negatives  Past Medical History- HTN, pituitary incidentaloma, dementia with behavioral disturbance, pre-diabetes, HLD  Family history reviewed for today's visit. No changes.  Social history- patient is a never smoker  Objective: BP 130/66   Pulse 68   Temp 98.3 F (36.8 C) (Oral)   Wt 154 lb (69.9 kg)   BMI 26.43 kg/m  Gen: NAD, alert, cooperative with exam Right Knee: No erythema, edema, or gross deformity. Mild tenderness to palpation along the medial joint line. Normal ROM. Very mild crepitus noted. No joint effusion. Anterior and posterior drawer negative. Valgus and varus stress tests negative. McMurray's test negative. Homan's sign negative. Neuro: 5/5 muscle strength in lower extremities bilaterally, sensation intact to light touch   Assessment/Plan: Right Knee Pain: Pt with right knee pain for the last 3 weeks after stepping wrong while she was walking. No erythema or edema on exam. She has mild tenderness to palpation of the medial joint line. Ligamentous testing is normal. I think this is likely an exacerbation of underlying osteoarthritis vs a mild muscle strain. No red flags. No signs of DVT. - Given that Pt has a history of dementia with behavioral  disturbance, will treat conservatively with Meloxicam 7.5mg  x 7 days and heat/ice - Advised Pt to buy a compression sleeve and to use Aspercreme to the knee daily - Follow-up if not improving.  Pre-diabetes: Did not have time to talk about diabetes - A1c ordered today - Follow-up with PCP   Hyman Bible, MD PGY-2

## 2016-04-03 NOTE — Patient Instructions (Addendum)
It was so nice to meet you!  I think you have strained your knee or irritated some underlying arthritis.   I have prescribed some Meloxicam. Please take this daily with food for 7 days.  I would also recommend using either heat or ice, Aspercreme, and a compression sleeve.  Please follow-up in 4 weeks if your knee pain is not getting better!  -Dr. Brett Albino

## 2016-04-03 NOTE — Assessment & Plan Note (Signed)
Pt with right knee pain for the last 3 weeks after stepping wrong while she was walking. No erythema or edema on exam. She has mild tenderness to palpation of the medial joint line. Ligamentous testing is normal. I think this is likely an exacerbation of underlying osteoarthritis vs a mild muscle strain. No red flags. No signs of DVT. - Given that Pt has a history of dementia with behavioral disturbance, will treat conservatively with Meloxicam 7.5mg  x 7 days and heat/ice - Advised Pt to buy a compression sleeve and to use Aspercreme to the knee daily - Follow-up if not improving.

## 2016-04-04 ENCOUNTER — Telehealth: Payer: Self-pay | Admitting: Internal Medicine

## 2016-04-04 ENCOUNTER — Encounter (INDEPENDENT_AMBULATORY_CARE_PROVIDER_SITE_OTHER): Payer: Medicare Other | Admitting: Licensed Clinical Social Worker

## 2016-04-04 NOTE — Telephone Encounter (Signed)
Pt's sister would like to get something for the pt's nerves.Pt has dementia. The pt is moving into a nursing home on Nov. 1 and pt's sister does not want her to get too excited or upset. Pt uses [facility] on [address]. Please advise. Thanks! ep

## 2016-04-04 NOTE — Telephone Encounter (Signed)
Please let patient know she will have to come in for evaluation.

## 2016-04-04 NOTE — Progress Notes (Signed)
Patient ID: Gabriela Burke, female   DOB: 12-Jun-1950, 65 y.o.   MRN: [id number]  Late entry for 04/03/16 Patient in office for SDA with MD on 04/03/16.  [name] [name] requested to meet with social work.   Sister is frustrated with patient for poor hygiene and wanting to leave the house to walk around the neighborhood.  Patient is frustrated with sister telling her what to do when she has already done what has been asked such at take a bath, etc.  Patient is alert times 3 at this time.  "I understand I have a touch of Dementia, my mother had it and I cared for her".    [name] reports the following symptoms/concerns at home: behavior problems, irritability, mood swings and sleep disturbance.: states patient does not sleep well at night, she likes to walk. The sisters are unable to communicite without raising their voice and over talking each other.  Patient continued with frustration and wanted to walk home at the end of the appointment.  LCSW walked with patient and sister our of the building, calmed patient down and she was willing to get into the car.    The following was discussed today: Education on dementia, education on sister's role as guardian, longterm and short term housing for patient. patient can no longer continue to live with sister, patient does not want to live with sister and is refusing to go to a facility.   INTERVENTIONS: Psychoeducation, Supportive Counseling, Reflective listening, mediation and emotional support.     [place] support worker [name] Radford Pax has been notify of this unhealthy, stressed relationship. Indicated she will follow up with patient and sister.   04/04/16 Received call from patient's sister [name]. Informed LCSW that she read the information booklet on dementia "I just don't have enough education to understand how to care for her, cause she really don't understand what I am telling her".   Sister wants to move  forward with placement at [facility] however is concerned because patient is refusing to go.    LCSW consulted with preceptor and Dr. McDiarmid  for possible options to assist with patient's behavior concerns.   PLAN: 1. [name] will file papers with the court to Relinquish guardianship. 2. Contact APS worker Junius Finner for continued guidance 3. Sister will try to remain calm with patient when she does not do what is asked. 4. LCSW will contact patient's PCP to consult with Dr.  McDiarmid to review medication options. 5. LCSW will follow up with sister in two weeks.  Casimer Lanius, LCSW Licensed Clinical Social Worker [facility]   [phone] 10:16 AM

## 2016-04-05 ENCOUNTER — Encounter (HOSPITAL_COMMUNITY): Payer: Self-pay | Admitting: *Deleted

## 2016-04-05 ENCOUNTER — Observation Stay (HOSPITAL_COMMUNITY)
Admission: EM | Admit: 2016-04-05 | Discharge: 2016-04-09 | Disposition: A | Discharge status: Home / Self Care | Payer: Medicare Other | Attending: Emergency Medicine | Admitting: Emergency Medicine

## 2016-04-05 DIAGNOSIS — I10 Essential (primary) hypertension: Secondary | ICD-10-CM | POA: Diagnosis not present

## 2016-04-05 DIAGNOSIS — Z91040 Latex allergy status: Secondary | ICD-10-CM | POA: Diagnosis not present

## 2016-04-05 DIAGNOSIS — Z79899 Other long term (current) drug therapy: Secondary | ICD-10-CM | POA: Diagnosis not present

## 2016-04-05 DIAGNOSIS — R451 Restlessness and agitation: Secondary | ICD-10-CM | POA: Diagnosis not present

## 2016-04-05 DIAGNOSIS — F03918 Unspecified dementia, unspecified severity, with other behavioral disturbance: Secondary | ICD-10-CM | POA: Diagnosis present

## 2016-04-05 DIAGNOSIS — R4182 Altered mental status, unspecified: Secondary | ICD-10-CM | POA: Diagnosis present

## 2016-04-05 DIAGNOSIS — E119 Type 2 diabetes mellitus without complications: Secondary | ICD-10-CM | POA: Insufficient documentation

## 2016-04-05 DIAGNOSIS — F0391 Unspecified dementia with behavioral disturbance: Secondary | ICD-10-CM | POA: Diagnosis present

## 2016-04-05 DIAGNOSIS — F0390 Unspecified dementia without behavioral disturbance: Secondary | ICD-10-CM | POA: Diagnosis present

## 2016-04-05 LAB — CBC
HCT: 40.8 % (ref 36.0–46.0)
Hemoglobin: 13.1 g/dL (ref 12.0–15.0)
MCH: 28.1 pg (ref 26.0–34.0)
MCHC: 32.1 g/dL (ref 30.0–36.0)
MCV: 87.6 fL (ref 78.0–100.0)
Platelets: 214 10*3/uL (ref 150–400)
RBC: 4.66 MIL/uL (ref 3.87–5.11)
RDW: 13.6 % (ref 11.5–15.5)
WBC: 5.7 10*3/uL (ref 4.0–10.5)

## 2016-04-05 LAB — URINALYSIS, ROUTINE W REFLEX MICROSCOPIC
Bilirubin Urine: NEGATIVE
Glucose, UA: NEGATIVE mg/dL
Ketones, ur: NEGATIVE mg/dL
Leukocytes, UA: NEGATIVE
Nitrite: NEGATIVE
Protein, ur: NEGATIVE mg/dL
Specific Gravity, Urine: 1.016 (ref 1.005–1.030)
pH: 6.5 (ref 5.0–8.0)

## 2016-04-05 LAB — URINE MICROSCOPIC-ADD ON: Bacteria, UA: NONE SEEN

## 2016-04-05 LAB — COMPREHENSIVE METABOLIC PANEL
ALT: 13 U/L — ABNORMAL LOW (ref 14–54)
AST: 27 U/L (ref 15–41)
Albumin: 3.6 g/dL (ref 3.5–5.0)
Alkaline Phosphatase: 78 U/L (ref 38–126)
Anion gap: 7 (ref 5–15)
BUN: 13 mg/dL (ref 6–20)
CO2: 26 mmol/L (ref 22–32)
Calcium: 8.8 mg/dL — ABNORMAL LOW (ref 8.9–10.3)
Chloride: 104 mmol/L (ref 101–111)
Creatinine, Ser: 0.93 mg/dL (ref 0.44–1.00)
GFR calc Af Amer: >60 mL/min (ref >60)
GFR calc non Af Amer: >60 mL/min (ref >60)
Glucose, Bld: 101 mg/dL — ABNORMAL HIGH (ref 65–99)
Potassium: 4.1 mmol/L (ref 3.5–5.1)
Sodium: 137 mmol/L (ref 135–145)
Total Bilirubin: 0.9 mg/dL (ref 0.3–1.2)
Total Protein: 6.7 g/dL (ref 6.5–8.1)

## 2016-04-05 LAB — RAPID URINE DRUG SCREEN, HOSP PERFORMED
Amphetamines: NOT DETECTED
Barbiturates: NOT DETECTED
Benzodiazepines: NOT DETECTED
Cocaine: NOT DETECTED
Opiates: NOT DETECTED
Tetrahydrocannabinol: NOT DETECTED

## 2016-04-05 MED ORDER — ACETAMINOPHEN 325 MG PO TABS: 650.0000 mg | ORAL_TABLET | ORAL | Status: DC | PRN

## 2016-04-05 MED ORDER — IBUPROFEN 600 MG PO TABS: 600.0000 mg | ORAL_TABLET | Freq: Three times a day (TID) | ORAL | Status: DC | PRN

## 2016-04-05 MED ORDER — LORAZEPAM 1 MG PO TABS
2.0000 mg | ORAL_TABLET | Freq: Once | ORAL | Status: AC
  Administered 2016-04-05: 2 mg via ORAL
  Filled 2016-04-05: qty 2

## 2016-04-05 MED ORDER — LORAZEPAM 1 MG PO TABS: 1.0000 mg | ORAL_TABLET | Freq: Three times a day (TID) | ORAL | Status: DC | PRN

## 2016-04-05 MED ORDER — NICOTINE 21 MG/24HR TD PT24: 21.0000 mg | MEDICATED_PATCH | Freq: Every day | TRANSDERMAL | Status: DC

## 2016-04-05 MED ORDER — DIVALPROEX SODIUM ER 500 MG PO TB24
500.0000 mg | ORAL_TABLET | Freq: Every day | ORAL | Status: DC
  Administered 2016-04-05 – 2016-04-09 (×5): 500 mg via ORAL
  Filled 2016-04-05 (×5): qty 1

## 2016-04-05 MED ORDER — SIMVASTATIN 40 MG PO TABS
40.0000 mg | ORAL_TABLET | Freq: Every day | ORAL | Status: DC
  Administered 2016-04-05 – 2016-04-08 (×4): 40 mg via ORAL
  Filled 2016-04-05 (×4): qty 1

## 2016-04-05 MED ORDER — ALUM & MAG HYDROXIDE-SIMETH 200-200-20 MG/5ML PO SUSP: 30.0000 mL | ORAL | Status: DC | PRN

## 2016-04-05 MED ORDER — MELOXICAM 7.5 MG PO TABS
7.5000 mg | ORAL_TABLET | Freq: Every day | ORAL | Status: DC
  Administered 2016-04-05: 7.5 mg via ORAL
  Filled 2016-04-05: qty 1

## 2016-04-05 MED ORDER — ZOLPIDEM TARTRATE 5 MG PO TABS: 5.0000 mg | ORAL_TABLET | Freq: Every evening | ORAL | Status: DC | PRN

## 2016-04-05 MED ORDER — HYDROCHLOROTHIAZIDE 25 MG PO TABS
12.5000 mg | ORAL_TABLET | Freq: Every day | ORAL | Status: DC
  Filled 2016-04-05: qty 0.5

## 2016-04-05 MED ORDER — ONDANSETRON HCL 4 MG PO TABS: 4.0000 mg | ORAL_TABLET | Freq: Three times a day (TID) | ORAL | Status: DC | PRN

## 2016-04-05 MED ORDER — DONEPEZIL HCL 5 MG PO TABS
5.0000 mg | ORAL_TABLET | Freq: Every day | ORAL | Status: DC
  Administered 2016-04-05 – 2016-04-08 (×4): 5 mg via ORAL
  Filled 2016-04-05 (×4): qty 1

## 2016-04-05 MED ORDER — LISINOPRIL 10 MG PO TABS
10.0000 mg | ORAL_TABLET | Freq: Every day | ORAL | Status: DC
  Administered 2016-04-05 – 2016-04-09 (×5): 10 mg via ORAL
  Filled 2016-04-05 (×5): qty 1

## 2016-04-05 NOTE — ED Triage Notes (Signed)
The pt is not si or hi

## 2016-04-05 NOTE — ED Triage Notes (Signed)
The pt is unable to remember things for  A long time  She reports that she misses herself.  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

## 2016-04-05 NOTE — ED Triage Notes (Signed)
The pts sister who lives in [place]   Is concerned that the pt is hostile  She has dementia and she feels like this sister is trying to get rid of her.  The pt told me her mother had just died the sister reports that their mother has been dead for 20 years.  The pt apparently over heard a conversation about where she might be going and has been more difficult to deal with since then.  The pt has also been wandering off does not want to take baths thinking that she has already took one

## 2016-04-05 NOTE — ED Provider Notes (Signed)
[facility] DEPT Provider Note   CSN: [id number] Arrival date & time: 04/05/16  1806     History   Chief Complaint Chief Complaint  Patient presents with  . Altered Mental Status   HPI   Blood pressure 138/88, pulse 66, temperature 98.6 F (37 C), resp. rate 19, weight 69.9 kg, SpO2 100 %.  Gabriela Burke is a 65 y.o. female brought in by sister who is her primary caregiver. Patient has history of dementia, she is cared for by her sister in her home. She's been coming increasingly agitated over the last 6 weeks with mood swings and violent outbursts. She wanders towards the highway. Adult Protective Services was called for her wandering. She is followed by neurologist Dr. Lavell Anchors. Been in her normal state of health no fever chills nausea vomiting change in bowel or bladder habits. She denies any pain. She denies suicidal ideation, homicidal ideation, auditory or visual hallucinations. As per patient she just wants to maintain what is "left of her." Her sister is concerned that she is a danger to herself and she cannot properly care for her at home.  Past Medical History:  Diagnosis Date  . DM (diabetes mellitus) ([place])   . HLD (hyperlipidemia)   . HTN (hypertension)     Patient Active Problem List   Diagnosis Date Noted  . Agitation 04/06/2016  . Right knee pain 04/03/2016  . Loss of weight 09/28/2015  . Distal radial fracture 07/19/2015  . Fracture of ulnar styloid 06/26/2015  . Right leg pain 06/07/2015  . Lesion of ala of nose 01/03/2015  . Dementia with behavioral disturbance 08/08/2014  . Pituitary incidentaloma ([place]) 04/23/2014  . Non compliance w medication regimen 03/21/2013  . Dementia 12/18/2010  . Pre-diabetes 11/16/2006  . HYPERCHOLESTEROLEMIA 08/06/2006  . HYPERTENSION, BENIGN SYSTEMIC 08/06/2006    Past Surgical History:  Procedure Laterality Date  . ROTATOR CUFF REPAIR    . TOTAL ABDOMINAL HYSTERECTOMY      OB History    No data available       Home Medications    Prior to Admission medications   Medication Sig Start Date End Date Taking? Authorizing Provider  divalproex (DEPAKOTE ER) 500 MG 24 hr tablet Take 1 tablet (500 mg total) by mouth daily. 10/01/15  Yes [name], MD  donepezil (ARICEPT) 5 MG tablet Take 1 tablet (5 mg total) by mouth at bedtime. 10/01/15  Yes [name], MD  hydrochlorothiazide (HYDRODIURIL) 12.5 MG tablet Take 1 tablet (12.5 mg total) by mouth daily. Take 1 tab by mouth every morning 08/24/13  Yes Angelica Ran, MD  lisinopril (PRINIVIL,ZESTRIL) 10 MG tablet Take 1 tablet (10 mg total) by mouth daily. 08/24/13  Yes Angelica Ran, MD  meloxicam (MOBIC) 7.5 MG tablet Take 1 tablet (7.5 mg total) by mouth daily. 04/03/16  Yes Sela Hua, MD  simvastatin (ZOCOR) 40 MG tablet Take 1 tablet (40 mg total) by mouth at bedtime. 10/01/15  Yes [name], MD    Family History Family History  Problem Relation Age of Onset  . Dementia Mother   . Schizophrenia Brother   . Stroke Brother   . Stroke Brother     Social History Social History  Substance Use Topics  . Smoking status: Never Smoker  . Smokeless tobacco: Never Used  . Alcohol use No     Allergies   Metformin and related and Latex   Review of Systems Review of Systems  10 systems  reviewed and found to be negative, except as noted in the HPI.   Physical Exam Updated Vital Signs BP 145/90 (BP Location: Right Arm)   Pulse 91   Temp 98.6 F (37 C)   Resp 24   Wt 69.9 kg   SpO2 98%   BMI 26.43 kg/m   Physical Exam  Constitutional: She is oriented to person, place, and time. She appears well-developed and well-nourished. No distress.  HENT:  Head: Normocephalic and atraumatic.  Mouth/Throat: Oropharynx is clear and moist.  Eyes: Conjunctivae and EOM are normal. Pupils are equal, round, and reactive to light.  Neck: Normal range of motion.  Cardiovascular: Normal rate, regular rhythm and intact  distal pulses.   Pulmonary/Chest: Effort normal and breath sounds normal.  Abdominal: Soft. There is no tenderness.  Musculoskeletal: Normal range of motion.  Neurological: She is alert and oriented to person, place, and time.  Skin: She is not diaphoretic.  Psychiatric: Her affect is labile. She is agitated and aggressive. She expresses no homicidal and no suicidal ideation.  Nursing note and vitals reviewed.    ED Treatments / Results  Labs (all labs ordered are listed, but only abnormal results are displayed) Labs Reviewed  COMPREHENSIVE METABOLIC PANEL - Abnormal; Notable for the following:       Result Value   Glucose, Bld 101 (*)    Calcium 8.8 (*)    ALT 13 (*)    All other components within normal limits  URINALYSIS, ROUTINE W REFLEX MICROSCOPIC (NOT AT [facility]) - Abnormal; Notable for the following:    Hgb urine dipstick SMALL (*)    All other components within normal limits  URINE MICROSCOPIC-ADD ON - Abnormal; Notable for the following:    Squamous Epithelial / LPF 0-5 (*)    All other components within normal limits  CBC  RAPID URINE DRUG SCREEN, HOSP PERFORMED  CBG MONITORING, ED    EKG  EKG Interpretation None       Radiology No results found.  Procedures Procedures (including critical care time)  Medications Ordered in ED Medications  divalproex (DEPAKOTE ER) 24 hr tablet 500 mg (500 mg Oral Given 04/05/16 2149)  donepezil (ARICEPT) tablet 5 mg (5 mg Oral Given 04/05/16 2148)  hydrochlorothiazide (HYDRODIURIL) tablet 12.5 mg (not administered)  lisinopril (PRINIVIL,ZESTRIL) tablet 10 mg (10 mg Oral Given 04/05/16 2150)  meloxicam (MOBIC) tablet 7.5 mg (7.5 mg Oral Given 04/05/16 2149)  simvastatin (ZOCOR) tablet 40 mg (40 mg Oral Given 04/05/16 2148)  alum & mag hydroxide-simeth (MAALOX/MYLANTA) 200-200-20 MG/5ML suspension 30 mL (not administered)  ondansetron (ZOFRAN) tablet 4 mg (not administered)  nicotine (NICODERM CQ - dosed in mg/24 hours)  patch 21 mg (21 mg Transdermal Refused 04/05/16 2136)  zolpidem (AMBIEN) tablet 5 mg (not administered)  ibuprofen (ADVIL,MOTRIN) tablet 600 mg (not administered)  acetaminophen (TYLENOL) tablet 650 mg (not administered)  LORazepam (ATIVAN) tablet 1 mg (not administered)  LORazepam (ATIVAN) tablet 2 mg (2 mg Oral Given 04/05/16 2336)     Initial Impression / Assessment and Plan / ED Course  I have reviewed the triage vital signs and the nursing notes.  Pertinent labs & imaging results that were available during my care of the patient were reviewed by me and considered in my medical decision making (see chart for details).  Clinical Course    Vitals:   04/05/16 2115 04/05/16 2130 04/05/16 2358 04/06/16 0006  BP: 151/97  (!) 163/106 145/90  Pulse:  81 115 91  Resp: 21  18 26 24   Temp:      SpO2:  99% 98% 98%  Weight:        Medications  divalproex (DEPAKOTE ER) 24 hr tablet 500 mg (500 mg Oral Given 04/05/16 2149)  donepezil (ARICEPT) tablet 5 mg (5 mg Oral Given 04/05/16 2148)  hydrochlorothiazide (HYDRODIURIL) tablet 12.5 mg (not administered)  lisinopril (PRINIVIL,ZESTRIL) tablet 10 mg (10 mg Oral Given 04/05/16 2150)  meloxicam (MOBIC) tablet 7.5 mg (7.5 mg Oral Given 04/05/16 2149)  simvastatin (ZOCOR) tablet 40 mg (40 mg Oral Given 04/05/16 2148)  alum & mag hydroxide-simeth (MAALOX/MYLANTA) 200-200-20 MG/5ML suspension 30 mL (not administered)  ondansetron (ZOFRAN) tablet 4 mg (not administered)  nicotine (NICODERM CQ - dosed in mg/24 hours) patch 21 mg (21 mg Transdermal Refused 04/05/16 2136)  zolpidem (AMBIEN) tablet 5 mg (not administered)  ibuprofen (ADVIL,MOTRIN) tablet 600 mg (not administered)  acetaminophen (TYLENOL) tablet 650 mg (not administered)  LORazepam (ATIVAN) tablet 1 mg (not administered)  LORazepam (ATIVAN) tablet 2 mg (2 mg Oral Given 04/05/16 2336)    Gabriela Burke is 65 y.o. female presenting with Increasing agitation, likely worsening of  her baseline dementia. She refuses to go into a memory care unit her sister feels overwhelmed and unable to care for her safely at home at this time. Patient will be evaluated by psych for possible medication management. I've also consulted case management for possible placement.  Patient is medically cleared for psychiatric evaluation will be transferred to the psych ED. TTS consulted, home meds and psych standard holding orders placed.   Gabriela Burke states that she does meet inpatient criteria.   Patient has ran out of the emergency department ambulance bay into the road, she was restrained by police and advised come back to the ED. Sister who is medical POA gives permission for sedation as needed. I've asked her to leave the department is a think this patient is perseverating on her sister's treatment of her.  Patient will need to be IVC during case she tries to leave again and police are hesitant to restrain her. Discussed this with family medicine resident Dr. Rosalyn Gess who accepts admission on behalf of attending [place].  Final Clinical Impressions(s) / ED Diagnoses   Final diagnoses:  Agitation    New Prescriptions New Prescriptions   No medications on file     Monico Blitz, PA-C 04/05/16 2121    [facility], PA-C 04/06/16 [id number]    Merryl Hacker, MD 04/06/16 2311

## 2016-04-05 NOTE — BH Assessment (Signed)
Tele Assessment Note   Gabriela Burke is an 65 y.o. female.  -Clinician reviewed note by Monico Blitz, PA.  Gabriela Burke is a 65 y.o. female brought in by sister who is her primary caregiver. Patient has history of dementia, she is cared for by her sister in her home. She's been coming increasingly agitated over the last 6 weeks with mood swings and violent outbursts. She wanders towards the highway. Adult Protective Services was called for her wandering. She is followed by neurologist Dr. Lavell Anchors.   Patient knows that she has a dx of dementia.  She had been living alone up to about 4 months ago. There was a APS report regarding patient wandering away from her home.  [place] became involved.  Gabriela Burke with DSS is still following her care.  Patient then started staying with her sister, Gabriela Burke.  Gabriela Burke is patient's healthcare POA and her general guardian.    Patient will still leave the sister's home.  Patient says she leaves to "take a walk and to visit friends."  Sister is worried about patient safety as she will wander towards the highway.  Patient argues with sister over her definition of wandering.  Patient has been presented the option by sister to go to a senior adult program [facility].  Patient refused to do this.  Patient argues with sister over everything that she says.  Patient talks about not being able to do the things she used to do.  She admits to some depression over this but she has no SI, plan or intention.  Patient denies HI plan or intention.  She denies any A/V hallucinations.  Patient has a neurologist, Dr. Lavell Anchors.  Sister said that she is going to follow up with Dr. Lavell Anchors on Monday about patient's recent behavior.  Sister has looked into a memory care placement.  Patient has no previous inpatient or outpatient psychiatric care.  Patient has no previous ETOH or illicit drug use history.  -Clinician discussed patient care with Gabriela Burke,  [place] who said that patient did not meet inpatient psychiatric care criteria at this time.  Clinician talked with Monico Blitz, PA about disposition.  She understood and said that she was going to contact patient's primary care and see if SW will look for placement.  Diagnosis: Dementia  Past Medical History:  Past Medical History:  Diagnosis Date  . DM (diabetes mellitus) ([place])   . HLD (hyperlipidemia)   . HTN (hypertension)     Past Surgical History:  Procedure Laterality Date  . ROTATOR CUFF REPAIR    . TOTAL ABDOMINAL HYSTERECTOMY      Family History:  Family History  Problem Relation Age of Onset  . Dementia Mother   . Schizophrenia Brother   . Stroke Brother   . Stroke Brother     Social History:  reports that she has never smoked. She has never used smokeless tobacco. She reports that she does not drink alcohol or use drugs.  Additional Social History:  Alcohol / Drug Use Pain Medications: See PTA medication list Prescriptions: See PTA medication list. Over the Counter: See PTA medication list History of alcohol / drug use?: No history of alcohol / drug abuse  CIWA: CIWA-Ar BP: 151/97 Pulse Rate: 81 COWS:    PATIENT STRENGTHS: (choose at least two) Average or above average intelligence Communication skills Supportive family/friends  Allergies:  Allergies  Allergen Reactions  . Metformin And Related Nausea Only    GI  upset   . Latex Other (See Comments)    unknown    Home Medications:  (Not in a hospital admission)  OB/GYN Status:  No LMP recorded. Patient has had a hysterectomy.  General Assessment Data Location of Assessment: [facility] ED TTS Assessment: In system Is this a Tele or Face-to-Face Assessment?: Tele Assessment Is this an Initial Assessment or a Re-assessment for this encounter?: Initial Assessment Marital status: Divorced Is patient pregnant?: No Pregnancy Status: No Living Arrangements: Other relatives (Staying with sister Gabriela Burke) Can pt return to current living arrangement?: Yes Admission Status: Voluntary Is patient capable of signing voluntary admission?: No Referral Source: Self/Family/Friend Insurance type: [facility]     Crisis Care Plan Living Arrangements: Other relatives (Staying with sister Gabriela Burke) Legal Guardian: Other relative Gabriela Burke (sister)) Name of Psychiatrist: None Name of Therapist: None  Education Status Is patient currently in school?: No Highest grade of school patient has completed: 12th grade  Risk to self with the past 6 months Suicidal Ideation: No Has patient been a risk to self within the past 6 months prior to admission? : No Suicidal Intent: No Has patient had any suicidal intent within the past 6 months prior to admission? : No Is patient at risk for suicide?: No Suicidal Plan?: No Has patient had any suicidal plan within the past 6 months prior to admission? : No Access to Means: No What has been your use of drugs/alcohol within the last 12 months?: None Previous Attempts/Gestures: No How many times?: 0 Other Self Harm Risks: None Triggers for Past Attempts: None known Intentional Self Injurious Behavior: None Family Suicide History: No Recent stressful life event(s): Conflict (Comment), Turmoil (Comment) (Turmoil with sister) Persecutory voices/beliefs?: Yes Depression: Yes Depression Symptoms: Feeling angry/irritable, Loss of interest in usual pleasures Substance abuse history and/or treatment for substance abuse?: No Suicide prevention information given to non-admitted patients: Not applicable  Risk to Others within the past 6 months Homicidal Ideation: No Does patient have any lifetime risk of violence toward others beyond the six months prior to admission? : No Thoughts of Harm to Others: No Current Homicidal Intent: No Current Homicidal Plan: No Access to Homicidal Means: No Identified Victim: No one History of harm to others?:  No Assessment of Violence: None Noted Violent Behavior Description: Pt denies. Does patient have access to weapons?: No Criminal Charges Pending?: No Does patient have a court date: No Is patient on probation?: No  Psychosis Hallucinations: None noted Delusions: None noted  Mental Status Report Appearance/Hygiene: Unremarkable, In hospital gown Eye Contact: Good Motor Activity: Freedom of movement, Unremarkable Speech: Argumentative, Loud Level of Consciousness: Alert Mood: Anxious, Suspicious, Angry, Irritable, Helpless Affect: Anxious, Angry, Threatening Anxiety Level: Moderate Thought Processes: Coherent Judgement: Impaired (Pt has dementia) Orientation: Not oriented Obsessive Compulsive Thoughts/Behaviors: None  Cognitive Functioning Concentration: Poor Memory: Recent Impaired, Remote Impaired IQ: Average Insight: Poor Impulse Control: Poor Appetite: Good Weight Loss: 0 Weight Gain: 0 Sleep: No Change Total Hours of Sleep: 8 Vegetative Symptoms: None  ADLScreening [facility] Assessment Services) Patient's cognitive ability adequate to safely complete daily activities?: Yes Patient able to express need for assistance with ADLs?: Yes Independently performs ADLs?: Yes (appropriate for developmental age)  Prior Inpatient Therapy Prior Inpatient Therapy: No Prior Therapy Dates: N/A Prior Therapy Facilty/Provider(s): N/A Reason for Treatment: NA  Prior Outpatient Therapy Prior Outpatient Therapy: No Prior Therapy Dates: N/A Prior Therapy Facilty/Provider(s): N/A Reason for Treatment: N?A Does patient have an ACCT team?: No Does patient  have Intensive In-House Services?  : No Does patient have [facility] services? : No Does patient have P4CC services?: No  ADL Screening (condition at time of admission) Patient's cognitive ability adequate to safely complete daily activities?: Yes Is the patient deaf or have difficulty hearing?: No Does the patient have difficulty  seeing, even when wearing glasses/contacts?: No (Pt has glasses but has trouble keeping up with them.) Does the patient have difficulty concentrating, remembering, or making decisions?: Yes Patient able to express need for assistance with ADLs?: Yes Does the patient have difficulty dressing or bathing?: No (May go for a few days without bathing.) Independently performs ADLs?: Yes (appropriate for developmental age) Does the patient have difficulty walking or climbing stairs?: No Weakness of Legs: None Weakness of Arms/Hands: None       Abuse/Neglect Assessment (Assessment to be complete while patient is alone) Physical Abuse: Denies Verbal Abuse: Denies Sexual Abuse: Denies Exploitation of patient/patient's resources: Denies Self-Neglect: Denies     Regulatory affairs officer (For Healthcare) Does patient have an advance directive?: Yes Would patient like information on creating an advanced directive?: No - patient declined information Type of Advance Directive: Healthcare Power of Attorney Gabriela Burke (sister)) Does patient want to make changes to advanced directive?: No - Patient declined Copy of advanced directive(s) in chart?: No - copy requested    Additional Information 1:1 In Past 12 Months?: No CIRT Risk: No Elopement Risk: Yes Does patient have medical clearance?: Yes     Disposition:  Disposition Initial Assessment Completed for this Encounter: Yes Disposition of Patient: Other dispositions Other disposition(s): Other (Comment) (Pt to be reviewed with NP)  Curlene Dolphin Ray 04/05/2016 10:59 PM

## 2016-04-05 NOTE — ED Notes (Signed)
Pt tried to run out the EMS doors. Pt was redirected to the room

## 2016-04-06 ENCOUNTER — Other Ambulatory Visit: Payer: Self-pay

## 2016-04-06 ENCOUNTER — Observation Stay (HOSPITAL_COMMUNITY): Payer: Medicare Other

## 2016-04-06 ENCOUNTER — Other Ambulatory Visit (HOSPITAL_COMMUNITY): Payer: Medicare Other

## 2016-04-06 ENCOUNTER — Encounter (HOSPITAL_COMMUNITY): Payer: Self-pay | Admitting: General Practice

## 2016-04-06 DIAGNOSIS — G300 Alzheimer's disease with early onset: Secondary | ICD-10-CM

## 2016-04-06 DIAGNOSIS — R451 Restlessness and agitation: Principal | ICD-10-CM | POA: Diagnosis present

## 2016-04-06 DIAGNOSIS — F0281 Dementia in other diseases classified elsewhere with behavioral disturbance: Secondary | ICD-10-CM | POA: Diagnosis not present

## 2016-04-06 MED ORDER — ACETAMINOPHEN 325 MG PO TABS: 650.0000 mg | ORAL_TABLET | Freq: Four times a day (QID) | ORAL | Status: DC | PRN

## 2016-04-06 MED ORDER — ACETAMINOPHEN 650 MG RE SUPP: 650.0000 mg | Freq: Four times a day (QID) | RECTAL | Status: DC | PRN

## 2016-04-06 MED ORDER — ENOXAPARIN SODIUM 40 MG/0.4ML ~~LOC~~ SOLN
40.0000 mg | SUBCUTANEOUS | Status: DC
  Administered 2016-04-06 – 2016-04-08 (×2): 40 mg via SUBCUTANEOUS
  Filled 2016-04-06 (×3): qty 0.4

## 2016-04-06 MED ORDER — HYDROCHLOROTHIAZIDE 12.5 MG PO CAPS
12.5000 mg | ORAL_CAPSULE | Freq: Every day | ORAL | Status: DC
  Administered 2016-04-06 – 2016-04-09 (×4): 12.5 mg via ORAL
  Filled 2016-04-06 (×4): qty 1

## 2016-04-06 MED ORDER — RISPERIDONE 0.5 MG PO TABS: 0.5000 mg | ORAL_TABLET | Freq: Every day | ORAL | Status: DC

## 2016-04-06 MED ORDER — RISPERIDONE 0.5 MG PO TABS
0.5000 mg | ORAL_TABLET | Freq: Every day | ORAL | Status: DC | PRN
  Administered 2016-04-08: 0.5 mg via ORAL
  Filled 2016-04-06 (×2): qty 1

## 2016-04-06 NOTE — H&P (Signed)
[facility] Admission History and Physical Service Pager: [phone]  Patient name: Gabriela Burke Medical record number: [id number] Date of birth: September 19, 1950 Age: 65 y.o. Gender: female  Primary Care Provider: Lockie Pares, MD Consultants: None  Code Status: Full  Chief Complaint: agitation, dementia  Assessment and Plan: Gabriela Burke is a 65 y.o. female presenting with agitation . PMH is significant for dementia  Agitation:  Per chart review, patient has been having increasing agitation the past couple of months. She was seen in the office in September for chief complaint of increased agitation and irritability.  Sister is main caregiver and there appears to be significant strain between patient and caregiver secondary to patient's dementia.  In the emergency department patient attempted to elope and was found to have the street restrained by police and brought back inside.  Was seen attempting to hit sister and sister was asked to leave.  Given 2 mg Ativan and patient was reportedly more calm. CBC, BMET, Utoxic, and UA was wnl. Head CT was obtained to r/o any acute process, finding focal hypodensity in the brainstem most consistent with artifact, thought could no rule out acute infarct. However, on exam, no neurologic deficits - normal gait, normal finger to nose testing, therefore likely artifact, no neurologic deficits noted.  - Admit to observation, med-surg, attending Dr. McDiarmid  - Continue to closely monitor - Avoid restraints if possible - Consult social work for placement  - [employer] in place  - Place under involuntary commitment - vitals per protocol  - Neurologic exam   Dementia: Alert and orientated to self only. Previously documented dementia, has been worsening over the past year. Sister who is main caregiver has made multiple calls to PCP office asking for help for placement to nursing home as patient has become too much for  her to handle.  She reportedly has a bed available November 1 at [facility] in [place], however patient has been running around the street over the past week and her increased agitation make sister worried that she might hurt herself or others.   - Continue Depakote 500 mg daily and Aricept 5 mg daily. - Will add Risperidone 0.5 mg prn for agitation  - Place under involuntary commitment - Consult psychiatry in AM for evaluation of capacity  Hypertension: Blood pressure within goal at 136/76 at admission. - Continue hydrochlorothiazide and lisinopril  Hypercholesterolemia: Take simvastatin 40 mg daily - Continue simvastatin  Knee pain:  Seen in clinic 3 days ago for Right knee pain, felt to be exacerbation of osteoarthritis vs: mild muscle strain.  Prescribed 7 days mobic 7.5mg  daily -- holding Mobic for now, if complaints will restart.   FEN/GI: Full diet Prophylaxis: lovenox  Disposition: admit to FMTS under attending McDiarmid  History of Present Illness:  Gabriela Burke is a 65 y.o. female presenting with increasing agitation.  65 year old female with history of early-onset dementia presenting with increasing agitation over the past month or 2.  [name] is main caregiver and is noted that patient has been increasingly agitated since at least September and she has been too much for her to handle lately.  She notes violent outbursts and mood swings and that she has been wandering into the streets over the past week. Previously adult protection services have been called on her for her wandering. Per PA note the patient has been in normal state of health without fever, nausea, vomiting or changes in bowel or bladder habits.  Last bowel movement was this morning.  Patient denies any auditory or visual hallucinations or has any thoughts of self harming or harming others.    Review Of Systems: Per HPI  Review of Systems  Constitutional: Negative for chills and fever.   HENT: Negative for congestion.   Eyes: Negative for blurred vision and double vision.  Respiratory: Negative for cough and shortness of breath.   Cardiovascular: Negative for chest pain and leg swelling.  Gastrointestinal: Negative for abdominal pain, nausea and vomiting.  Genitourinary: Negative for dysuria and urgency.  Musculoskeletal: Negative for myalgias.  Skin: Negative for itching and rash.  Neurological: Negative for dizziness, focal weakness and headaches.  Psychiatric/Behavioral: Negative for hallucinations and suicidal ideas.    Patient Active Problem List   Diagnosis Date Noted  . Agitation 04/06/2016  . Right knee pain 04/03/2016  . Loss of weight 09/28/2015  . Distal radial fracture 07/19/2015  . Fracture of ulnar styloid 06/26/2015  . Right leg pain 06/07/2015  . Lesion of ala of nose 01/03/2015  . Dementia with behavioral disturbance 08/08/2014  . 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 Non compliance w medication regimen 03/21/2013  . Dementia 12/18/2010  . Pre-diabetes 11/16/2006  . HYPERCHOLESTEROLEMIA 08/06/2006  . HYPERTENSION, BENIGN SYSTEMIC 08/06/2006    Past Medical History: Past Medical History:  Diagnosis Date  . DM (diabetes mellitus) ([place])   . HLD (hyperlipidemia)   . HTN (hypertension)     Past Surgical History: Past Surgical History:  Procedure Laterality Date  . ROTATOR CUFF REPAIR    . TOTAL ABDOMINAL HYSTERECTOMY      Social History: Social History  Substance Use Topics  . Smoking status: Never Smoker  . Smokeless tobacco: Never Used  . Alcohol use No    Family History: Family History  Problem Relation Age of Onset  . Dementia Mother   . Schizophrenia Brother   . Stroke Brother   . Stroke Brother    Allergies and Medications: Allergies  Allergen Reactions  . Metformin And Related Nausea Only    GI upset   . Latex Other (See Comments)    unknown   No current facility-administered medications on file prior to  encounter.    Current Outpatient Prescriptions on File Prior to Encounter  Medication Sig Dispense Refill  . divalproex (DEPAKOTE ER) 500 MG 24 hr tablet Take 1 tablet (500 mg total) by mouth daily. 30 tablet 1  . donepezil (ARICEPT) 5 MG tablet Take 1 tablet (5 mg total) by mouth at bedtime. 30 tablet 1  . hydrochlorothiazide (HYDRODIURIL) 12.5 MG tablet Take 1 tablet (12.5 mg total) by mouth daily. Take 1 tab by mouth every morning 30 tablet 5  . lisinopril (PRINIVIL,ZESTRIL) 10 MG tablet Take 1 tablet (10 mg total) by mouth daily. 30 tablet 5  . meloxicam (MOBIC) 7.5 MG tablet Take 1 tablet (7.5 mg total) by mouth daily. 7 tablet 0  . simvastatin (ZOCOR) 40 MG tablet Take 1 tablet (40 mg total) by mouth at bedtime. 30 tablet 1    Objective: BP 136/76 (BP Location: Right Arm)   Pulse 87   Temp 97.5 F (36.4 C) (Oral)   Resp 20   Wt 154 lb (69.9 kg)   SpO2 100%   BMI 26.43 kg/m  Exam: General: 65 year old female sitting up in bed appearing comfortable Eyes: EOMI, no scleral icterus, noninjected ENTM: MMM Neck: Supple, no lymphadenopathy Cardiovascular: Regular rate and rhythm, S1-S2 present, no murmurs rubs or gallops Respiratory: Normal work  of breathing, clear to auscultation bilaterally, no wheezing, rhonchi Gastrointestinal: Soft, nontender, nondistended MSK: Moves all extremities Derm: Warm and dry Neuro: Alert to person, but not place or time, reports no changes in sensation, able to follow commands Psych: Flat affect and a calm demeanor. Denies any homicidal or suicidal ideation  Labs and Imaging: CBC BMET   Recent Labs Lab 04/05/16 1820  WBC 5.7  HGB 13.1  HCT 40.8  PLT 214    Recent Labs Lab 04/05/16 1820  NA 137  K 4.1  CL 104  CO2 26  BUN 13  CREATININE 0.93  GLUCOSE 101*  CALCIUM 8.8*     Ct Head Wo Contrast  Result Date: 04/06/2016 CLINICAL DATA:  65 year old female with increased agitation and confusion. EXAM: CT HEAD WITHOUT CONTRAST  TECHNIQUE: Contiguous axial images were obtained from the base of the skull through the vertex without intravenous contrast. COMPARISON:  PET CT dated 08/15/2015 and MRI dated 07/26/2015 as well as MRI dated 07/26/2015 FINDINGS: Brain: The ventricles and sulci are appropriate in size for patient's age. Minimal periventricular and deep white matter chronic microvascular ischemic changes noted. There is no acute intracranial hemorrhage. No mass effect or midline shift noted. No extra-axial fluid collection. Focal area of hypodensity in the pons seen on the sagittal images (series 5, image 27) most likely artifactual. MRI is recommended if there is high clinical concern for an acute infarct. Vascular: No hyperdense vessel or unexpected calcification. Skull: Normal. Negative for fracture or focal lesion. Sinuses/Orbits: No acute finding. Other: None IMPRESSION: No acute intracranial hemorrhage. Focal area of hypodensity in the brainstem most likely artifactual. MRI may provide better evaluation there is clinical concern for acute infarct. Electronically Signed   By: Anner Crete M.D.   On: 04/06/2016 00:54    Eloise Levels, MD 04/06/2016, 12:52 AM PGY-1, [place] Intern pager: [phone], text pages welcome

## 2016-04-06 NOTE — Final Consult Note (Signed)
Capacity determination in a patient with history of Dementia with behavior disturbance. I spoke to the [facility] medicine attending and informed her that patient has a Building services engineer as documented by the Social worker Ms Laurance Flatten, [place]. [name] should be contacted if patient cannot make her own informed decision and she agreed. Corena Pilgrim, MD

## 2016-04-06 NOTE — ED Notes (Signed)
Pt asked to borrow a phone to call her mother. Pt did remember a phone number, the phone number to the person did not answer but it did calm the pt down being able to try to call. Pt has agreed to go ahead and get the CT Scan done.

## 2016-04-07 ENCOUNTER — Ambulatory Visit: Payer: Medicare Other | Admitting: Internal Medicine

## 2016-04-07 DIAGNOSIS — R451 Restlessness and agitation: Secondary | ICD-10-CM | POA: Diagnosis not present

## 2016-04-07 DIAGNOSIS — F0281 Dementia in other diseases classified elsewhere with behavioral disturbance: Secondary | ICD-10-CM | POA: Diagnosis not present

## 2016-04-07 DIAGNOSIS — G300 Alzheimer's disease with early onset: Secondary | ICD-10-CM | POA: Diagnosis not present

## 2016-04-07 LAB — BASIC METABOLIC PANEL
Anion gap: 7 (ref 5–15)
Anion gap: 9 (ref 5–15)
BUN: 9 mg/dL (ref 6–20)
BUN: 11 mg/dL (ref 6–20)
CO2: 28 mmol/L (ref 22–32)
CO2: 28 mmol/L (ref 22–32)
Calcium: 9 mg/dL (ref 8.9–10.3)
Calcium: 9.6 mg/dL (ref 8.9–10.3)
Chloride: 106 mmol/L (ref 101–111)
Chloride: 102 mmol/L (ref 101–111)
Creatinine, Ser: 0.85 mg/dL (ref 0.44–1.00)
Creatinine, Ser: 0.94 mg/dL (ref 0.44–1.00)
GFR calc Af Amer: >60 mL/min (ref >60)
GFR calc Af Amer: >60 mL/min (ref >60)
GFR calc non Af Amer: >60 mL/min (ref >60)
GFR calc non Af Amer: >60 mL/min (ref >60)
Glucose, Bld: 167 mg/dL — ABNORMAL HIGH (ref 65–99)
Glucose, Bld: 109 mg/dL — ABNORMAL HIGH (ref 65–99)
Potassium: 3.4 mmol/L — ABNORMAL LOW (ref 3.5–5.1)
Potassium: 3.7 mmol/L (ref 3.5–5.1)
Sodium: 141 mmol/L (ref 135–145)
Sodium: 139 mmol/L (ref 135–145)

## 2016-04-07 LAB — CBC
HCT: 40.1 % (ref 36.0–46.0)
Hemoglobin: 13.3 g/dL (ref 12.0–15.0)
MCH: 28.7 pg (ref 26.0–34.0)
MCHC: 33.2 g/dL (ref 30.0–36.0)
MCV: 86.6 fL (ref 78.0–100.0)
Platelets: 202 10*3/uL (ref 150–400)
RBC: 4.63 MIL/uL (ref 3.87–5.11)
RDW: 13.5 % (ref 11.5–15.5)
WBC: 4.8 10*3/uL (ref 4.0–10.5)

## 2016-04-07 NOTE — Clinical Social Work Note (Signed)
CSW talked with patient's sister and legal guardian, Pricilla Handler at the hospital regarding discharge disposition for patient. Daughter requesting [facility]. Call made to facility and they need patient's can take patient, however need her clinicals to fully assess patient. Full assessment to follow and clinicals to facility to follow.  Ashaun Gaughan Givens, MSW, LCSW Licensed Clinical Social Worker [place] [phone]

## 2016-04-07 NOTE — Care Management Note (Signed)
Case Management Note  Patient Details  Name: TALEIGH SEPE MRN: [id number] Date of Birth: 27-Nov-1950  Subjective/Objective:     CM following for progression and d/c planning.                Action/Plan: 04/07/2016 Noted CM referral for placement of this pt, this infor forwarded to CSW, Crawford Givens as placement of pts is the role of social work.   Expected Discharge Date:                  Expected Discharge Plan:  [place]  In-House Referral:  Clinical Social Work  Discharge planning Services  NA  Post Acute Care Choice:  NA Choice offered to:  NA  DME Arranged:   NA DME Agency:   NA  HH Arranged:   NA HH Agency:   NA  Status of Service:  Completed, signed off  If discussed at [place] of Stay Meetings, dates discussed:    Additional Comments:  Adron Bene, RN 04/07/2016, 9:51 AM

## 2016-04-07 NOTE — Care Management (Signed)
04/07/2016 Pt does not meet INPT or OBS criteria. Call placed to pt sister to inform her of this as pt has dementia and is unable to process this information. Per pt sister, Gabriela Burke, she understands and also states that she is unable to take the pt out of the hospital due to her dementia and need for placement directly from the hospital. [place], [name] is currently working on placement to a memory care unit for this pt.  Jasmine Pang RN MPH, case manager, [phone]

## 2016-04-07 NOTE — Care Management Obs Status (Signed)
[place] NOTIFICATION   Patient Details  Name: Gabriela Burke MRN: [id number] Date of Birth: [birth year]   Medicare Observation Status Notification Given:  Yes    Shya Kovatch, Rory Percy, RN 04/07/2016, 11:28 AM

## 2016-04-07 NOTE — Progress Notes (Signed)
Family Medicine Teaching Service Daily Progress Note Intern Pager: [phone]  Patient name: Gabriela Burke Medical record number: [id number] Date of birth: 03/30/1951 Age: 65 y.o. Gender: female  Primary Care Provider: Lockie Pares, MD Consultants: None Code Status: Full code  Pt Overview and Major Events to Date:  1. Admit to FMTS  Assessment and Plan: Gabriela Burke is a 65 y.o. female presenting with agitation . PMH is significant for dementia  Agitation:  Per chart review, patient has been having increasing agitation the past couple of months. She was seen in the office in September for chief complaint of increased agitation and irritability.  Sister is main caregiver and there appears to be significant strain between patient and caregiver secondary to patient's dementia.  In the emergency department patient attempted to elope and was found to have the street restrained by police and brought back inside.  Was seen attempting to hit sister and sister was asked to leave.  Given 2 mg Ativan and patient was reportedly more calm. CBC, BMET, Utoxic, and UA was wnl. Head CT was obtained to r/o any acute process, finding focal hypodensity in the brainstem most consistent with artifact, thought could no rule out acute infarct. However, on exam, no neurologic deficits.  Patient calm this morning.  - Continue to closely monitor - Avoid restraints if possible - Consult social work for placement  - [employer] in place  - Place under involuntary commitment - vitals per protocol   Dementia: Alert and orientated to self only. Previously documented dementia, has been worsening over the past year. Sister who is main caregiver has made multiple calls to PCP office asking for help for placement to nursing home as patient has become too much for her to handle.  She reportedly has a bed available November 1 at [facility] in [place], however patient has been running around the street over  the past week and her increased agitation make sister worried that she might hurt herself or others.  Confirmed that patient does have bed on 11/1 in [place]. There is a question of whether insurance will cover this.  [name] [name] provided Education officer, museum, Lorriane Shire with Guardianship paperwork.   - Continue Depakote 500 mg daily and Aricept 5 mg daily. - Risperidone 0.5 mg prn for agitation  - Place under involuntary commitment  Hypertension: Blood pressure within goal at 134/68 at admission. - Continue hydrochlorothiazide and lisinopril  Hypercholesterolemia: Take simvastatin 40 mg daily - Continue simvastatin  Knee pain:  Seen in clinic 3 days ago for Right knee pain, felt to be exacerbation of osteoarthritis vs: mild muscle strain.  Prescribed 7 days mobic 7.5mg  daily -- holding Mobic for now, if complaints will restart.   FEN/GI: Full diet Prophylaxis: lovenox  Disposition: awaiting placement  Subjective:  Patient has no complaints this morning.  Wants to be home with her cats. Endorses some suicidal ideation without a plan.   Objective: Temp:  [97.9 F (36.6 C)-98.8 F (37.1 C)] 98.1 F (36.7 C) (10/30 0926) Pulse Rate:  [75-96] 96 (10/30 0926) Resp:  [16-18] 17 (10/30 0926) BP: (126-137)/(60-75) 134/68 (10/30 0926) SpO2:  [98 %-100 %] 100 % (10/30 0926) Weight:  [155 lb 6.4 oz (70.5 kg)] 155 lb 6.4 oz (70.5 kg) (10/29 2047) Physical Exam: General: 65yo F sitting on edge of bed comfortable and in NAD, but a bit irritated that she is in hospital Cardiovascular: RRR, no murmurs Respiratory: NWOB, CTABL Abdomen: soft, NTND Extremities: no cyanosis, clubbing  or edema  Laboratory:  Recent Labs Lab 04/05/16 1820 04/07/16 0617  WBC 5.7 4.8  HGB 13.1 13.3  HCT 40.8 40.1  PLT 214 202    Recent Labs Lab 04/05/16 1820 04/07/16 0617  NA 137 141  K 4.1 3.4*  CL 104 106  CO2 26 28  BUN 13 9  CREATININE 0.93 0.85  CALCIUM 8.8* 9.0  PROT 6.7  --    BILITOT 0.9  --   ALKPHOS 78  --   ALT 13*  --   AST 27  --   GLUCOSE 101* 167*    Imaging/Diagnostic Tests: Ct Head Wo Contrast  Result Date: 04/06/2016 CLINICAL DATA:  65 year old female with increased agitation and confusion. EXAM: CT HEAD WITHOUT CONTRAST TECHNIQUE: Contiguous axial images were obtained from the base of the skull through the vertex without intravenous contrast. COMPARISON:  PET CT dated 08/15/2015 and MRI dated 07/26/2015 as well as MRI dated 07/26/2015 FINDINGS: Brain: The ventricles and sulci are appropriate in size for patient's age. Minimal periventricular and deep white matter chronic microvascular ischemic changes noted. There is no acute intracranial hemorrhage. No mass effect or midline shift noted. No extra-axial fluid collection. Focal area of hypodensity in the pons seen on the sagittal images (series 5, image 27) most likely artifactual. MRI is recommended if there is high clinical concern for an acute infarct. Vascular: No hyperdense vessel or unexpected calcification. Skull: Normal. Negative for fracture or focal lesion. Sinuses/Orbits: No acute finding. Other: None IMPRESSION: No acute intracranial hemorrhage. Focal area of hypodensity in the brainstem most likely artifactual. MRI may provide better evaluation there is clinical concern for acute infarct. Electronically Signed   By: Anner Crete M.D.   On: 04/06/2016 00:54   Eloise Levels, MD 04/07/2016, 9:48 AM PGY-1, [place] Intern pager: [phone], text pages welcome

## 2016-04-08 DIAGNOSIS — G300 Alzheimer's disease with early onset: Secondary | ICD-10-CM | POA: Diagnosis not present

## 2016-04-08 DIAGNOSIS — F0281 Dementia in other diseases classified elsewhere with behavioral disturbance: Secondary | ICD-10-CM | POA: Diagnosis not present

## 2016-04-08 DIAGNOSIS — R451 Restlessness and agitation: Secondary | ICD-10-CM | POA: Diagnosis not present

## 2016-04-08 LAB — GLUCOSE, CAPILLARY: Glucose-Capillary: 137 mg/dL — ABNORMAL HIGH (ref 65–99)

## 2016-04-08 MED ORDER — RISPERIDONE 0.5 MG PO TABS
0.5000 mg | ORAL_TABLET | Freq: Two times a day (BID) | ORAL | Status: DC | PRN
  Filled 2016-04-08 (×2): qty 1

## 2016-04-08 NOTE — NC FL2 (Signed)
[place] MEDICAID FL2 LEVEL OF CARE SCREENING TOOL     IDENTIFICATION  Patient Name: Gabriela Burke Birthdate: 1950/08/09 Sex: female Admission Date (Current Location): 04/05/2016  [facility] and [place] Number:  Herbalist and Address:  [facility] [place], [place] [address], [place], [place] [address]      Provider Number: [id number]  Attending Physician Name and Address:  Blane Ohara McDiarmid, MD  Relative Name and Phone Number:  Tyson,Gwendolyn - Sister and legal guardian;  Phone [phone]     Current Level of Care: Hospital Recommended Level of Care: [place] Prior Approval Number:    Date Approved/Denied:   PASRR Number: [id number]  Discharge Plan: SNF    Current Diagnoses: Patient Active Problem List   Diagnosis Date Noted  . Agitation 04/06/2016  . Right knee pain 04/03/2016  . Loss of weight 09/28/2015  . Distal radial fracture 07/19/2015  . Fracture of ulnar styloid 06/26/2015  . Right leg pain 06/07/2015  . Lesion of ala of nose 01/03/2015  . Dementia with behavioral disturbance 08/08/2014  . 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 Non compliance w medication regimen 03/21/2013  . Dementia 12/18/2010  . Pre-diabetes 11/16/2006  . HYPERCHOLESTEROLEMIA 08/06/2006  . HYPERTENSION, BENIGN SYSTEMIC 08/06/2006    Orientation RESPIRATION BLADDER Height & Weight     Self  Normal Continent Weight: 152 lb 12.5 oz (69.3 kg) Height:     BEHAVIORAL SYMPTOMS/MOOD NEUROLOGICAL BOWEL NUTRITION STATUS  Wanderer, Dangerous to self, others or property (Per sister, patient is a danger to herself due to her wandering)   Continent Diet (Regular)  AMBULATORY STATUS COMMUNICATION OF NEEDS Skin   Independent Verbally Normal                       Personal Care Assistance Level of Assistance  Bathing, Feeding, Dressing Bathing Assistance: Independent Feeding assistance: Independent Dressing Assistance: Independent     Functional Limitations Info  Sight, Hearing, Speech Sight Info: Adequate Hearing Info: Adequate Speech Info: Adequate    SPECIAL CARE FACTORS FREQUENCY                       Contractures Contractures Info: Not present    Additional Factors Info  Code Status, Allergies Code Status Info: Full Allergies Info: Metfformin, Latex           Current Medications (04/08/2016):  This is the current hospital active medication list Current Facility-Administered Medications  Medication Dose Route Frequency Provider Last Rate Last Dose  . acetaminophen (TYLENOL) tablet 650 mg  650 mg Oral Q6H PRN Asiyah Cletis Media, MD       Or  . acetaminophen (TYLENOL) suppository 650 mg  650 mg Rectal Q6H PRN Asiyah Cletis Media, MD      . alum & mag hydroxide-simeth (MAALOX/MYLANTA) 200-200-20 MG/5ML suspension 30 mL  30 mL Oral PRN Nicole Pisciotta, PA-C      . divalproex (DEPAKOTE ER) 24 hr tablet 500 mg  500 mg Oral Daily Nicole Pisciotta, PA-C   500 mg at 04/08/16 0910  . donepezil (ARICEPT) tablet 5 mg  5 mg Oral QHS Nicole Pisciotta, PA-C   5 mg at 04/07/16 2111  . enoxaparin (LOVENOX) injection 40 mg  40 mg Subcutaneous Q24H Asiyah Cletis Media, MD   40 mg at 04/08/16 0913  . hydrochlorothiazide (MICROZIDE) capsule 12.5 mg  12.5 mg Oral Daily Blane Ohara McDiarmid, MD   12.5 mg at 04/08/16  [id number]  . lisinopril (PRINIVIL,ZESTRIL) tablet 10 mg  10 mg Oral Daily Nicole Pisciotta, PA-C   10 mg at 04/08/16 0911  . risperiDONE (RISPERDAL) tablet 0.5 mg  0.5 mg Oral Daily PRN Asiyah Cletis Media, MD      . simvastatin (ZOCOR) tablet 40 mg  40 mg Oral QHS Nicole Pisciotta, PA-C   40 mg at 04/07/16 2200     Discharge Medications: Please see discharge summary for a list of discharge medications.  Relevant Imaging Results:  Relevant Lab Results:   Additional Information [phone]  Sable Feil, LCSW

## 2016-04-08 NOTE — Care Management Note (Addendum)
Case Management Note  Patient Details  Name: Gabriela Burke MRN: [id number] Date of Birth: 1950/08/18  Subjective/Objective:      CM following for progress and d/c planning.               Action/Plan: 04/08/2016 Met with pt and talked at length pt stated that she was feeling very nervous. Spoke of her mother and brother, whom she cares for and she needs to get home to care for them. Per pt sister, both the pt brother and mother are deceased.  The pt was pleasant able to carry on a conversation and was agreeable to taking Respidol for her "nervousness" at 2:50pm. Will monitor pt for effects of this medication.    Expected Discharge Date:                  Expected Discharge Plan:  Skilled Nursing Facility  In-House Referral:  Clinical Social Work  Discharge planning Services  NA  Post Acute Care Choice:  NA Choice offered to:  NA  DME Arranged:    DME Agency:     HH Arranged:    [place] Agency:     Status of Service:  Completed, signed off  If discussed at [employer], dates discussed:    Additional Comments:  Adron Bene, RN 04/08/2016, 3:09 PM

## 2016-04-08 NOTE — Progress Notes (Signed)
Family Medicine Teaching Service Daily Progress Note Intern Pager: [phone]  Patient name: Gabriela Burke Medical record number: [id number] Date of birth: 21-Jan-1951 Age: 65 y.o. Gender: female  Primary Care Provider: Lockie Pares, MD Consultants: None Code Status: Full code  Pt Overview and Major Events to Date:  1. Admit to FMTS  Assessment and Plan: Gabriela Burke is a 64 y.o. female presenting with agitation . PMH is significant for dementia  Agitation:  Per chart review, patient has been having increasing agitation the past couple of months. She was seen in the office in September for chief complaint of increased agitation and irritability.  Sister is main caregiver and there appears to be significant strain between patient and caregiver secondary to patient's dementia.  In the emergency department patient attempted to elope and was found to have the street restrained by police and brought back inside.  Was seen attempting to hit sister and sister was asked to leave.  Given 2 mg Ativan and patient was reportedly more calm. CBC, BMET, Utoxic, and UA was wnl. Head CT was obtained to r/o any acute process, finding focal hypodensity in the brainstem most consistent with artifact, thought could no rule out acute infarct. However, on exam, no neurologic deficits.  Patient calm this morning.  - Continue to closely monitor - Avoid restraints if possible - Consult social work for placement  - [employer] in place  - Place under involuntary commitment - vitals per protocol   Dementia: Alert and orientated to self only. Previously documented dementia, has been worsening over the past year. Sister who is main caregiver has made multiple calls to PCP office asking for help for placement to nursing home as patient has become too much for her to handle.  She reportedly has a bed available November 1 at [facility] in [place], however patient has been running around the street over  the past week and her increased agitation make sister worried that she might hurt herself or others.  Confirmed that patient does have bed on 11/1 in [place]. Investigating whether they may take her today. There is a question of whether insurance will cover care. PT to see today.  - Continue Depakote 500 mg daily and Aricept 5 mg daily. - Risperidone 0.5 mg prn for agitation  - Place under involuntary commitment  Hypertension: Blood pressure within goal at 134/68 at admission. - Continue hydrochlorothiazide and lisinopril  Hypercholesterolemia: Take simvastatin 40 mg daily - Continue simvastatin  Knee pain:  Seen in clinic 3 days ago for Right knee pain, felt to be exacerbation of osteoarthritis vs: mild muscle strain.  Prescribed 7 days mobic 7.5mg  daily -- holding Mobic for now, if complaints will restart.   FEN/GI: Full diet Prophylaxis: lovenox  Disposition: awaiting placement  Subjective:  Patient has no complaints this morning.    Objective: Temp:  [98 F (36.7 C)-98.2 F (36.8 C)] 98.1 F (36.7 C) (10/31 0611) Pulse Rate:  [61-99] 61 (10/31 0611) Resp:  [17-18] 17 (10/31 0611) BP: (110-138)/(60-78) 110/60 (10/31 0611) SpO2:  [98 %-100 %] 98 % (10/31 0611) Weight:  [152 lb 12.5 oz (69.3 kg)] 152 lb 12.5 oz (69.3 kg) (10/30 2058) Physical Exam: General: 65yo F standing, looking out the window and singing Cardiovascular: RRR, no murmurs Respiratory: NWOB, CTABL Abdomen: soft, NTND Extremities: no cyanosis, clubbing or edema  Laboratory:  Recent Labs Lab 04/05/16 1820 04/07/16 0617  WBC 5.7 4.8  HGB 13.1 13.3  HCT 40.8 40.1  PLT  [place] Lab 04/05/16 1820 04/07/16 0617 04/07/16 1547  NA 137 141 139  K 4.1 3.4* 3.7  CL 104 106 102  CO2 26 28 28   BUN 13 9 11   CREATININE 0.93 0.85 0.94  CALCIUM 8.8* 9.0 9.6  PROT 6.7  --   --   BILITOT 0.9  --   --   ALKPHOS 78  --   --   ALT 13*  --   --   AST 27  --   --   GLUCOSE 101* 167*  109*    Imaging/Diagnostic Tests: Ct Head Wo Contrast  Result Date: 04/06/2016 CLINICAL DATA:  65 year old female with increased agitation and confusion. EXAM: CT HEAD WITHOUT CONTRAST TECHNIQUE: Contiguous axial images were obtained from the base of the skull through the vertex without intravenous contrast. COMPARISON:  PET CT dated 08/15/2015 and MRI dated 07/26/2015 as well as MRI dated 07/26/2015 FINDINGS: Brain: The ventricles and sulci are appropriate in size for patient's age. Minimal periventricular and deep white matter chronic microvascular ischemic changes noted. There is no acute intracranial hemorrhage. No mass effect or midline shift noted. No extra-axial fluid collection. Focal area of hypodensity in the pons seen on the sagittal images (series 5, image 27) most likely artifactual. MRI is recommended if there is high clinical concern for an acute infarct. Vascular: No hyperdense vessel or unexpected calcification. Skull: Normal. Negative for fracture or focal lesion. Sinuses/Orbits: No acute finding. Other: None IMPRESSION: No acute intracranial hemorrhage. Focal area of hypodensity in the brainstem most likely artifactual. MRI may provide better evaluation there is clinical concern for acute infarct. Electronically Signed   By: Anner Crete M.D.   On: 04/06/2016 00:54   Eloise Levels, MD 04/08/2016, 7:24 AM PGY-1, [place] Intern pager: [phone], text pages welcome

## 2016-04-08 NOTE — Clinical Social Work Note (Signed)
Clinical Social Work Assessment  Patient Details  Name: Gabriela Burke MRN: [id number] Date of Birth: 1951-04-01  Date of referral:  04/08/16               Reason for consult:  Facility Placement                Permission sought to share information with:  Family Supports Permission granted to share information::  No (Patient has a legal guardian, sister Pricilla Handler)  Name::     [place]::  Skilled nursing facilities  Relationship::  Sister  Contact Information:  [phone]  Housing/Transportation Living arrangements for the past 2 months:  Apartment Source of Information:  Other (Comment Required) (Sister, Sales executive) Patient Interpreter Needed:  None Criminal Activity/Legal Involvement Pertinent to Current Situation/Hospitalization:  No - Comment as needed Significant Relationships:  Siblings Lives with:  Siblings Do you feel safe going back to the place where you live?  No (Sister reported that patient cannot return to her home and has been working on LTC placement for patient.) Need for family participation in patient care:  Yes (Comment)  Care giving concerns:  Sister came to hospital to talk with CSW about her sister's need for LTC placement at a skilled nursing facility, reporting that she can no longer care for patient in her home. CSW viewed paperwork regarding guardianship and paperwork indicating what is needed by Medicaid worker to process LTC application for patient.   Social Worker assessment / plan:  [name] explained to CSW that she has been working on patient going to a skilled facility for LTC due to patient constantly trying to leave the home and her resentfulness of her because of being her guardian. [name] tearfully shared that she plans to give up being guardian of the person and will just request to be guardian of the estate due to strain this has placed on their relationship and also on her marriage since her sister has lived with her.  [name] that she retired from her job to be at home because of her sister's behaviors and always trying to leave the home to go care for their mother and brother, who are deceased. CSW informed that patient had to come live with her as she could not longer leave at home safely and [name] allowed CSW to view pictures of patient's home which illustrated that unlivable environment. The plan per sister is for patient to discharge to [facility] where they have a locked unit.  Employment status:  Unemployed Forensic scientist:  Self Pay (Medicaid Pending) (Sister is working on obtaining Medicaid for patient) PT Recommendations:  No Follow Up (Patient does not require SNF for rehab) Information / Referral to community resources:  Other (Comment Required) ([name] had already begun process of finding facility placement for patient.)  Patient/Family's Response to care:  [name] expressed no concern regarding the care patient has received during hospitalization.  Patient/Family's Understanding of and Emotional Response to Diagnosis, Current Treatment, and Prognosis:  Sister understands that patient's dementia is progressive and wants he in a safe environment.  Emotional Assessment Appearance:  Appears younger than stated age Attitude/Demeanor/Rapport:  Other (Agitated and attempting to leave hospital) Affect (typically observed):  Agitated Orientation:  Oriented to Self (Patient has dementia) Alcohol / Substance use:  Tobacco Use, Alcohol Use, Illicit Drugs (Patient reported that she has never smoked cigarettes and does not consume alcohol or use illict drugs) Psych involvement (Current and /or  in the community):  Yes (Comment)  Discharge Needs  Concerns to be addressed:  Discharge Planning Concerns Readmission within the last 30 days:  No Current discharge risk:  Other (Due to high risk of elopment, patient has a Actuary) Barriers to Discharge:  Ship broker,  Inadequate or no insurance (Patient does not currently have Medicaid, application pending and sister working on providing information needed to process application.)   Sable Feil, LCSW 04/08/2016, 6:54 PM

## 2016-04-08 NOTE — Evaluation (Signed)
Physical Therapy Evaluation and DISCHARGE Patient Details Name: Gabriela Burke MRN: [id number] DOB: Apr 26, 1951 Today's Date: 04/08/2016   History of Present Illness  pt is a 65 y/o female with pmh significant for dementia, HTN, admitted with agitation and due to pt's sister's inability to care for the patient.  Clinical Impression  Pt continues to show baseline agitation, but can be redirected to complete a short-term task.  Pt's mobility is at an independent level in a home-like environment, but her lack of safety awareness is what makes her more appropriate for a supervised setting.  No further PT needed.  Will sign off.    Follow Up Recommendations No PT follow up    Equipment Recommendations  None recommended by PT    Recommendations for Other Services       Precautions / Restrictions Precautions Precautions: Other (comment) (pt not safety aware)      Mobility  Bed Mobility               General bed mobility comments: NT  Transfers Overall transfer level: Independent                  Ambulation/Gait Ambulation/Gait assistance: Independent Ambulation Distance (Feet): 600 Feet Assistive device: None Gait Pattern/deviations: WFL(Within Functional Limits)   Gait velocity interpretation: >2.62 ft/sec, indicative of independent community ambulator General Gait Details: steady and fluid  Stairs Stairs: Yes Stairs assistance: Modified independent (Device/Increase time) Stair Management: One rail Right;Alternating pattern;Forwards Number of Stairs: 12    Wheelchair Mobility    Modified Rankin (Stroke Patients Only)       Balance Overall balance assessment: Independent                               Standardized Balance Assessment Standardized Balance Assessment : Dynamic Gait Index   Dynamic Gait Index Level Surface: Normal Change in Gait Speed: Normal Gait and Pivot Turn: Normal Step Over Obstacle: Normal Steps: Mild  Impairment       Pertinent Vitals/Pain Pain Assessment: No/denies pain    Home Living Family/patient expects to be discharged to:: Skilled nursing facility Living Arrangements: Other relatives                    Prior Function Level of Independence: Needs assistance   Gait / Transfers Assistance Needed: independent           Hand Dominance        Extremity/Trunk Assessment   Upper Extremity Assessment: Overall WFL for tasks assessed           Lower Extremity Assessment: Overall WFL for tasks assessed      Cervical / Trunk Assessment: Normal  Communication   Communication: No difficulties  Cognition Arousal/Alertness: Awake/alert Behavior During Therapy: Agitated Overall Cognitive Status: History of cognitive impairments - at baseline Area of Impairment: Safety/judgement         Safety/Judgement: Decreased awareness of safety     General Comments: pt's conversations are superficial and agitated.    General Comments General comments (skin integrity, edema, etc.): As expected, pt's physical functioning is not an issue, but she shows signs in the limited time with her that she doesn't or is unable to make sound/safe decisions.  "I'm just heading out this door and walking home."    Exercises     Assessment/Plan    PT Assessment Patent does not need any further PT services  PT Problem List  PT Treatment Interventions      PT Goals (Current goals can be found in the Care Plan section)  Acute Rehab PT Goals PT Goal Formulation: All assessment and education complete, DC therapy    Frequency     Barriers to discharge        Co-evaluation               End of Session   Activity Tolerance: Patient tolerated treatment well Patient left: in chair;with nursing/sitter in room Nurse Communication: Mobility status    Functional Assessment Tool Used: clinical judgement Functional Limitation: Mobility: Walking and moving  around Mobility: Walking and Moving Around Current Status [id number]): 0 percent impaired, limited or restricted Mobility: Walking and Moving Around Goal Status [phone]): 0 percent impaired, limited or restricted Mobility: Walking and Moving Around Discharge Status [id number]): 0 percent impaired, limited or restricted    Time: 1440-1453 PT Time Calculation (min) (ACUTE ONLY): 13 min   Charges:   PT Evaluation $PT Eval Low Complexity: 1 Procedure     PT G Codes:   PT G-Codes **NOT FOR INPATIENT CLASS** Functional Assessment Tool Used: clinical judgement Functional Limitation: Mobility: Walking and moving around Mobility: Walking and Moving Around Current Status [id number]): 0 percent impaired, limited or restricted Mobility: Walking and Moving Around Goal Status [id number]): 0 percent impaired, limited or restricted Mobility: Walking and Moving Around Discharge Status [id number]): 0 percent impaired, limited or restricted    Schae Cando, Tessie Fass 04/08/2016, 3:09 PM 04/08/2016  Donnella Sham, [place] [phone]  (pager)

## 2016-04-08 NOTE — Clinical Social Work Note (Signed)
CSW talked with admissions director, Chrys Racer with [facility] regarding placement. Chrys Racer has talked with patient's sister Pricilla Handler and will be able to accept patient on Wednesday, 11/1 for LTC placement. Sister contacted [phone]) and updated regarding discharge. CSW consulted with assistant director Nathaniel Man regarding placement and discharge.  Zettie Gootee Givens, MSW, LCSW Licensed Clinical Social Worker [place] [phone]

## 2016-04-08 NOTE — Discharge Summary (Signed)
[facility] Discharge Summary  Patient name: Gabriela Burke Medical record number: [id number] Date of birth: [birth year] Age: 65 y.o. Gender: female Date of Admission: 04/05/2016  Date of Discharge: 04/09/2016 Admitting Physician: Blane Ohara McDiarmid, MD  Primary Care Provider: Lockie Pares, MD Consultants: None  Indication for Hospitalization: Agitation  Discharge Diagnoses/Problem List:  Principal Problem:   Dementia with behavioral disturbance Active Problems:   Dementia   Agitation  Disposition: SNF  Discharge Condition: Stable  Discharge Exam:  Temp:  [97.7 F (36.5 C)-98.8 F (37.1 C)] 98.8 F (37.1 C) (11/01 0945) Pulse Rate:  [86-89] 88 (11/01 0945) Resp:  [18] 18 (11/01 0945) BP: (135-148)/(66-74) 136/68 (11/01 0945) SpO2:  [95 %-100 %] 95 % (11/01 0945) Physical Exam: General: NAD, making bed, pleasant and friendly Cardiovascular: RRR, no m/r/g Respiratory: CTA bil, no W/R/R Abdomen: soft and nontender, normoactive BS, no HSM Extremities: warm and well-perfused, no edema Psych: AAOx2: Oriented to person and place, not time.   Brief Hospital Course:  65 year old female presented to emergency department accompanied by her sister who is her primary caregiver and legal guardian for concerns of increased agitation, aggression, impulsivity and wandering outside of home.  Sister had been reportedly attempting to find assisted living facility for patient and reportedly has a placement [place] on 04/09/2016.  However sister is concerned about the patient and her safety and has brought her in for assistance with placement.  Patient had CT head that showed hypodensity in the pons. EKG and lab values were largely normal. In the ED she attempted to elope and was restrained and brought back and inside by police. She reportedly had an outpatient referral to see [facility] psych, made by neurologist Dr. Jaynee Eagles and patient never followed up.  Patient  was admitted for observation, and placed under involuntary commitment. During admission she has been calm and cooperative.  Medical problems have been stable throughout admission.  Patient was accepted at [facility]. and was discharged in stable condition.   Issues for Follow Up:  1. Agitation - likely due to worsening dementia.  Patient's caretaker is her sister, had been trying to get placement in SNF.  Now at [facility] in [place]. Was started on PRN risperidone for agitation during hospitalization. 2. Patient's other chronic medical problems including HTN and HLD were stable throughout hospital stay.  Significant Procedures:  Ct Head Wo Contrast  Result Date: 04/06/2016 CLINICAL DATA:  65 year old female with increased agitation and confusion. EXAM: CT HEAD WITHOUT CONTRAST TECHNIQUE: Contiguous axial images were obtained from the base of the skull through the vertex without intravenous contrast. COMPARISON:  PET CT dated 08/15/2015 and MRI dated 07/26/2015 as well as MRI dated 07/26/2015 FINDINGS: Brain: The ventricles and sulci are appropriate in size for patient's age. Minimal periventricular and deep white matter chronic microvascular ischemic changes noted. There is no acute intracranial hemorrhage. No mass effect or midline shift noted. No extra-axial fluid collection. Focal area of hypodensity in the pons seen on the sagittal images (series 5, image 27) most likely artifactual. MRI is recommended if there is high clinical concern for an acute infarct. Vascular: No hyperdense vessel or unexpected calcification. Skull: Normal. Negative for fracture or focal lesion. Sinuses/Orbits: No acute finding. Other: None IMPRESSION: No acute intracranial hemorrhage. Focal area of hypodensity in the brainstem most likely artifactual. MRI may provide better evaluation there is clinical concern for acute infarct. Electronically Signed   By: Anner Crete M.D.   On:  04/06/2016 00:54     Significant Labs and Imaging:   Recent Labs Lab 04/05/16 1820 04/07/16 0617  WBC 5.7 4.8  HGB 13.1 13.3  HCT 40.8 40.1  PLT 214 202    Recent Labs Lab 04/05/16 1820 04/07/16 0617 04/07/16 1547  NA 137 141 139  K 4.1 3.4* 3.7  CL 104 106 102  CO2 26 28 28   GLUCOSE 101* 167* 109*  BUN 13 9 11   CREATININE 0.93 0.85 0.94  CALCIUM 8.8* 9.0 9.6  ALKPHOS 78  --   --   AST 27  --   --   ALT 13*  --   --   ALBUMIN 3.6  --   --      Results/Tests Pending at Time of Discharge:  None  Discharge Medications:    Medication List    TAKE these medications   divalproex 500 MG 24 hr tablet Commonly known as:  DEPAKOTE ER Take 1 tablet (500 mg total) by mouth daily.   donepezil 5 MG tablet Commonly known as:  ARICEPT Take 1 tablet (5 mg total) by mouth at bedtime.   hydrochlorothiazide 12.5 MG tablet Commonly known as:  HYDRODIURIL Take 1 tablet (12.5 mg total) by mouth daily. Take 1 tab by mouth every morning   lisinopril 10 MG tablet Commonly known as:  PRINIVIL,ZESTRIL Take 1 tablet (10 mg total) by mouth daily.   meloxicam 7.5 MG tablet Commonly known as:  MOBIC Take 1 tablet (7.5 mg total) by mouth daily.   risperiDONE 0.5 MG tablet Commonly known as:  RISPERDAL Take 1 tablet (0.5 mg total) by mouth 2 (two) times daily as needed (agigtation).   simvastatin 40 MG tablet Commonly known as:  ZOCOR Take 1 tablet (40 mg total) by mouth at bedtime.       Discharge Instructions: Please refer to Patient Instructions section of EMR for full details.  Patient was counseled important signs and symptoms that should prompt return to medical care, changes in medications, dietary instructions, activity restrictions, and follow up appointments.   Follow-Up Appointments: Follow-up Information    [place]. Go on 04/14/2016.   Why:  10:30 AM appointment for hospital follow up. Please arrive 15 minutes early. Contact information: [place] [phone]          Everrett Coombe, MD 04/09/2016, 11:44 AM PGY-1, [place]

## 2016-04-08 NOTE — Clinical Social Work Placement (Signed)
   CLINICAL SOCIAL WORK PLACEMENT  NOTE  Date:  04/08/2016  Patient Details  Name: Gabriela Burke MRN: [id number] Date of Birth: 05-09-1951  Clinical Social Work is seeking post-discharge placement for this patient at the [place] level of care (*CSW will initial, date and re-position this form in  chart as items are completed):  No ([name] had already chosen a facility for patient.)   Patient/family provided with [place] Work Department's list of facilities offering this level of care within the geographic area requested by the patient (or if unable, by the patient's family).  Yes   Patient/family informed of their freedom to choose among providers that offer the needed level of care, that participate in Medicare, Medicaid or managed care program needed by the patient, have an available bed and are willing to accept the patient.  No   Patient/family informed of [facility]'s ownership interest in [facility] and [facility], as well as of the fact that they are under no obligation to receive care at these facilities.  PASRR submitted to EDS on       PASRR number received on       Existing PASRR number confirmed on 04/08/16     FL2 transmitted to all facilities in geographic area requested by pt/family on 04/08/16 (Clinical information sent to [facility] and 2 other facilities)     FL2 transmitted to all facilities within larger geographic area on       Patient informed that his/her managed care company has contracts with or will negotiate with certain facilities, including the following:        No   Patient/family informed of bed offers received.  Patient chooses bed at [facility]     Physician recommends and patient chooses bed at      Patient to be transferred to [facility] on 04/09/16.  Patient to be transferred to facility by Ambulance     Patient family notified on  04/08/16 of transfer.  Name of family member notified:  Pricilla Handler [id number]     PHYSICIAN       Additional Comment:    _______________________________________________ Sable Feil, LCSW 04/08/2016, 7:09 PM

## 2016-04-09 MED ORDER — RISPERIDONE 1 MG PO TABS
1.0000 mg | ORAL_TABLET | Freq: Once | ORAL | Status: DC
  Filled 2016-04-09: qty 1

## 2016-04-09 MED ORDER — RISPERIDONE 0.5 MG PO TABS: 0.5000 mg | ORAL_TABLET | Freq: Two times a day (BID) | ORAL | 0 refills | Status: DC | PRN

## 2016-04-09 MED ORDER — LORAZEPAM 1 MG PO TABS
1.0000 mg | ORAL_TABLET | Freq: Once | ORAL | Status: AC
  Administered 2016-04-09: 1 mg via ORAL
  Filled 2016-04-09: qty 1

## 2016-04-09 NOTE — Discharge Instructions (Signed)
You were hospitalized for confusion and agitation. Your symptoms improved with medication during your hospitalization.  While you were in the hospital you had pictures taken of your brain which showed you had not had a stroke. You will be discharged to a skilled nursing facility.  You are discharged with all of your previous medications as well as Risperidone which can help with your symptoms of agitation.  Please go to your hospital follow up visit on Monday 11/6 at 10:30 AM with Dr. Alease Frame.

## 2016-04-09 NOTE — Clinical Social Work Placement (Signed)
   CLINICAL SOCIAL WORK PLACEMENT  NOTE 04/09/16 - DISCHARGED TO [facility]  Date:  04/09/2016  Patient Details  Name: Gabriela Burke MRN: [id number] Date of Birth: June 07, 1951  Clinical Social Work is seeking post-discharge placement for this patient at the [place] level of care (*CSW will initial, date and re-position this form in  chart as items are completed):  No ([name] had already chosen a facility for patient.)   Patient/family provided with [facility] Work Department's list of facilities offering this level of care within the geographic area requested by the patient (or if unable, by the patient's family).  Yes   Patient/family informed of their freedom to choose among providers that offer the needed level of care, that participate in Medicare, Medicaid or managed care program needed by the patient, have an available bed and are willing to accept the patient.  No   Patient/family informed of [facility]'s ownership interest in [facility] and [facility], as well as of the fact that they are under no obligation to receive care at these facilities.  PASRR submitted to EDS on       PASRR number received on       Existing PASRR number confirmed on 04/08/16     FL2 transmitted to all facilities in geographic area requested by pt/family on 04/08/16 (Clinical information sent to [facility] and 2 other facilities)     FL2 transmitted to all facilities within larger geographic area on       Patient informed that his/her managed care company has contracts with or will negotiate with certain facilities, including the following:        No - Patient/family informed of bed offers received (sister secured facility for patient).  Patient chooses bed at [facility]     Physician recommends and patient chooses bed at      Patient to be transferred to [facility] on  04/09/16.  Patient to be transferred to facility by Ambulance     Patient family notified on 04/08/16 of transfer.  Name of family member notified:  Pricilla Handler [phone]     PHYSICIAN       Additional Comment:    _______________________________________________ Sable Feil, LCSW 04/09/2016, 3:21 PM

## 2016-04-09 NOTE — Care Management (Signed)
04/09/2016 Resident notified that pt has placement at [facility] and they are requesting that pt arrive this am. This CM request completion of d/c summary, d/c orders and medication to sedate pt enough to place in ambulance for trip to that facility . Prior order for Resperdol is not sufficient to control this pt's anxiety and ability to resist care.  Jasmine Pang RN MPH, case manager [phone]

## 2016-04-09 NOTE — Clinical Social Work Psych Note (Signed)
IVC has been rescinded.  Nonnie Done, MSW, LCSW  ([id number]) [id number]  Licensed Clinical Social Worker

## 2016-04-10 ENCOUNTER — Other Ambulatory Visit: Payer: Self-pay | Admitting: *Deleted

## 2016-04-10 MED ORDER — DIVALPROEX SODIUM ER 500 MG PO TB24: 500.0000 mg | ORAL_TABLET | Freq: Every day | ORAL | 1 refills | Status: DC

## 2016-04-10 MED ORDER — SIMVASTATIN 40 MG PO TABS: 40.0000 mg | ORAL_TABLET | Freq: Every day | ORAL | 1 refills | Status: DC

## 2016-04-10 MED ORDER — DONEPEZIL HCL 5 MG PO TABS: 5.0000 mg | ORAL_TABLET | Freq: Every day | ORAL | 1 refills | Status: DC

## 2016-04-14 ENCOUNTER — Inpatient Hospital Stay: Payer: Medicare Other | Admitting: Family Medicine

## 2016-04-15 ENCOUNTER — Encounter: Payer: Self-pay | Admitting: Licensed Clinical Social Worker

## 2016-04-15 NOTE — Progress Notes (Signed)
other

## 2016-04-15 NOTE — Progress Notes (Signed)
Patient ID: Gabriela Burke, female   DOB: [birth year], 65 y.o.   MRN: [id number]   LCSW informed by patient's sister, Ms. Turner that patient has been placed in Long-term care at the [facility] in [place].   PCP notified.  Casimer Lanius, LCSW Licensed Clinical Social Worker [facility]   [phone] 3:36 PM

## 2016-05-08 ENCOUNTER — Ambulatory Visit: Payer: Medicare Other

## 2016-05-21 ENCOUNTER — Ambulatory Visit: Payer: Medicare Other | Admitting: Neurology

## 2016-06-17 ENCOUNTER — Ambulatory Visit: Payer: Medicare Other | Admitting: Sports Medicine

## 2016-07-23 ENCOUNTER — Ambulatory Visit (INDEPENDENT_AMBULATORY_CARE_PROVIDER_SITE_OTHER): Payer: Medicare Other | Admitting: Neurology

## 2016-07-23 ENCOUNTER — Encounter: Payer: Self-pay | Admitting: Neurology

## 2016-07-23 VITALS — BP 122/78 | HR 67 | Ht 64.0 in | Wt 133.8 lb

## 2016-07-23 DIAGNOSIS — F0281 Dementia in other diseases classified elsewhere with behavioral disturbance: Secondary | ICD-10-CM

## 2016-07-23 DIAGNOSIS — G308 Other Alzheimer's disease: Secondary | ICD-10-CM | POA: Diagnosis not present

## 2016-07-23 DIAGNOSIS — F02818 Dementia in other diseases classified elsewhere, unspecified severity, with other behavioral disturbance: Secondary | ICD-10-CM

## 2016-07-23 MED ORDER — DONEPEZIL HCL 10 MG PO TABS: 10.0000 mg | ORAL_TABLET | Freq: Every day | ORAL | 4 refills | Status: DC

## 2016-07-23 MED ORDER — MEMANTINE HCL 10 MG PO TABS: 10.0000 mg | ORAL_TABLET | Freq: Two times a day (BID) | ORAL | 4 refills | Status: DC

## 2016-07-23 NOTE — Progress Notes (Signed)
[facility]    Provider:  Dr Jaynee Eagles Referring Provider: Tonette Bihari, MD,  [name], MD Primary Care Physician:  Renata Caprice, DO  CC: dementia with behavioral disturbances  Interval history: Gabriela Burke is in the [facility]. It is in [place] it is almost an hour away. Gabriela Burke is doing well. Gabriela Burke helps the other patients. No significant agitation, Gabriela Burke is doing well.   Interval History 11/20/2015: 66 year old with progressed Alzheimer's Dementia. 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 Sister tries to help but patient has behavioral episodes, refuses medications. Patient is always agitated when Gabriela Burke is here, nothing changed today. Sister tries very hard. Discussed sister's situation, there is caregiver burnout. Sister has pictures of patient's house, dirty and cluttered appears very unsafe. Reviewed in their medicine brain PET scan consistent with Alzheimer's type metabolic pathology. Patient says Gabriela Burke knows Gabriela Burke has dementia. Patient did not want to move from the home. Sr. is in the process of obtaining guardianship.  Addendum 08/15/2015: NM Brain PET: 1. Biparietal and bitemporal cortical hypometabolism is consistent with Alzheimer's type metabolic pathology. 2. Normal occipital lobe activity would be inconsistent with dementia with Lewy bodies. 3. Normal frontal lobe activity would be inconsistent with frontotemporal dementia.   Interval history 07/26/2015; Patient with significant history of memory loss, behavioral disturbances, probable psychiatric disease. Most history from sister. Patient has been diagnosed with dementia with behavioral disturbances. There appears to psychiatric component as well and unsure how much the memory loss and behavior is due to psychiatric causes vs a dementing process. MRi of the brain was unremarkable in 2015 and given the level of memory impairment will repeat MRi of the brain and see if there is progressive atrophy or any other  changes we would expect. At this point we would likely see at least some atrophy given the level of memory loss. If mRI of the brain is still largely unremarkable appearing, will order a PET scan and also needs psychiatric evaluation. Neurocognitive testing would be helpful if patient would agree. There is a social situation and sister tries to care for her and patient is very angry, agitated, disorganized. Patient screams and hollars all the time per sister, patient leaves cat food around the house and it is attracting rats. Patient is refusing all her medications. Patient pretends that Gabriela Burke takes medications and sister finds the pills around the house. Sister manages all the finances. No significant progression in memory loss since last being seen, patient is stable per sister and would expect progressive decline if there is a dementing process. Patient has been referred to psychiatry in the past and did not go. Patient will not take a bath per sister. Brother has schizophrenia. Dementia in the mother. Appears Dr. Raeford Razor has called adult protective services recently as noted in the chart  Sister report: Needed to speak with sister separately as they are fighting: Per sister; patient is hallucinating. Sister is POA. . Patient is having delusions and hallucinations. Gabriela Burke asks her sister where the people went and sister says there were no people around. Patient thinks people are stealing her money. Gabriela Burke was seeing people who were not there. Her house is a mess. Sister called adult protective services and they did not come. Patient hurt her leg, sister doesn't know what happened. Patient doesn't use the stove, Gabriela Burke just puts everything in the microwave. Patient had a fall but unknown why. Patient is a Ship broker.   Patient report: Gabriela Burke says Gabriela Burke cannot perform everyday  tasks. Gabriela Burke says no one is giving her medication. Gabriela Burke does not know what medications Gabriela Burke is supposed to take. Gabriela Burke says Gabriela Burke doesn't t have a problem  taking meds but Gabriela Burke has a problem with people asking her about the meds. Then Gabriela Burke says someone else gives her medications. Patient says her sister does all her bills. Patient says Gabriela Burke can't take care of her bills. Patient says Gabriela Burke feels very paranoid about people taking her money. Patient says Gabriela Burke wants to scream all the time. Patient says Gabriela Burke sits on the porch with her cats all day. Gabriela Burke denies being a Ship broker.   Psychiatric medications taken in the past per chart review: Depakote, Prozac,   HPI original visit 08/08/2014: Gabriela Burke is a 66 y.o. female with a PMHxof HTN, HLD who is here as a referral from Dr. Raeford Razor for dementia. Gabriela Burke is here with sister who provides much of the history as patient is apoor historian. review of notes shows that Gabriela Burke has scored an 8/10 on a previous MoCA, sister is trying to care for patient but patient refuses medications, patient's house is reportedly out of order, and sounds like patient is adversarial to her sister and not ready to accept that Gabriela Burke needs help. Patient is here with sister today and patient is agitated towards sister. Sister says Gabriela Burke is just trying to help and remains calmer. Patient readily admits Gabriela Burke has dementia and reports that Gabriela Burke doesn't remember the things Gabriela Burke should. Patient says Gabriela Burke gets agitated and says Gabriela Burke knows very well that Gabriela Burke has dementia. Patient says her memory loss started long afo, and Gabriela Burke is aware. Patient says that Gabriela Burke never thought that Gabriela Burke would have dementia, Gabriela Burke has tried to keep herself moving all her life and is very upset by her condition. Patient knows Gabriela Burke has lost her memories on certain things. Patient gets upset because Gabriela Burke can't find things and Gabriela Burke won't always be herself. Patient lives alone is in the house. Sister says patient is very angry about what is happening to her and sister is agitated often. Sister doesn't want to get sick and is tired of being yelled at. Sister is trying to support the patient. Sister  has a full time job as well. Patient is not taking her medicine. Denies hallucinations. Sister is very concerned about her sister and is trying to care for her, appears very caring. Patient tears up today about her situation.   Reviewed notes, labs and imaging from outside physicians, which showed:  TSH 1/206 WNL,  Recent labs: BMP WNL, HgbA1c 6.2,  B12 in 07/2012 500 RPR 07/2012 NR   MRi of the brain 2015 (reviewed images and agree with results below) IMPRESSION: 1. The pituitary gland is enlarged in the midline, measuring up to 9.5 mm on the sagittal images. Correlate with hormonal testing. 2. No significant distortion of the cavernous sinus or optic chiasm. 3. Otherwise unremarkable MRI of the brain.    Review of Systems: Patient complains of symptoms per HPI as well as the following symptoms: cramps, feeling hot, feeling cold. Pertinent negatives per HPI. All others negative.  Social History   Social History  . Marital status: Single    Spouse name: N/A  . Number of children: 0  . Years of education: 12   Occupational History  . Not on file.   Social History Main Topics  . Smoking status: Never Smoker  . Smokeless tobacco: Never Used  . Alcohol use No  . Drug use: No  .  Sexual activity: Not on file   Other Topics Concern  . 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     Caffeine: occassional use    Family History  Problem Relation Age of Onset  . Dementia Mother   . Schizophrenia Brother   . Stroke Brother   . Stroke Brother     Past Medical History:  Diagnosis Date  . Agitation   . Dementia   . DM (diabetes mellitus) ([place])   . HLD (hyperlipidemia)   . HTN (hypertension)   . Osteoarthritis of right knee     Past Surgical History:  Procedure Laterality Date  . ROTATOR CUFF REPAIR    . TOTAL ABDOMINAL HYSTERECTOMY      Current Outpatient Prescriptions  Medication Sig Dispense Refill  .  atorvastatin (LIPITOR) 20 MG tablet Take 20 mg by mouth daily at 6 PM.    . divalproex (DEPAKOTE ER) 500 MG 24 hr tablet Take 1 tablet (500 mg total) by mouth daily. (Patient taking differently: Take 500 mg by mouth 2 (two) times daily. ) 30 tablet 1  . donepezil (ARICEPT) 5 MG tablet Take 1 tablet (5 mg total) by mouth at bedtime. 30 tablet 1  . hydrochlorothiazide (HYDRODIURIL) 12.5 MG tablet Take 1 tablet (12.5 mg total) by mouth daily. Take 1 tab by mouth every morning 30 tablet 5  . lisinopril (PRINIVIL,ZESTRIL) 10 MG tablet Take 1 tablet (10 mg total) by mouth daily. 30 tablet 5  . LORazepam (ATIVAN) 1 MG tablet Take 1 mg by mouth 2 (two) times daily.    . meloxicam (MOBIC) 7.5 MG tablet Take 1 tablet (7.5 mg total) by mouth daily. 7 tablet 0  . sertraline (ZOLOFT) 100 MG tablet Take 100 mg by mouth daily.     No current facility-administered medications for this visit.     Allergies as of 07/23/2016 - Review Complete 07/23/2016  Allergen Reaction Noted  . Metformin and related Nausea Only 12/08/2013  . Latex Other (See Comments) 05/08/2008    Vitals: BP 122/78   Pulse 67   Ht 5\' 4"  (1.626 m)   Wt 133 lb 12.8 oz (60.7 kg)   BMI 22.97 kg/m  Last Weight:  Wt Readings from Last 1 Encounters:  07/23/16 133 lb 12.8 oz (60.7 kg)   Last Height:   Ht Readings from Last 1 Encounters:  07/23/16 5\' 4"  (1.626 m)    Assessment/Plan: 66 year old female with Alzheimer's dementia doing well.  -  Today's history and physical demonstrated very substantial and measurable cognitive dysfunction. It is very clear Gabriela Burke does not have the capacity to make informed and appropriate decisions on her healthcare and finances. It is clear that patient does not comprehend the degree of risks that Gabriela Burke may be in with reported fall, reported neglect within the home and medication non-compliance.   As far as your medications are concerned, I would like to suggest:  Increase Aricept(Donepezil) to 10 mg  daily. In 1-2 weeks if no side effects start Namenda (Memantine) once daily for 2 weeks then twice daily.   I would like to see you back in 1 year, sooner if we need to. Please call us with any interim questions, concerns, problems, updates or refill requests.   - Extensive lab testing completed: TSH,RPR,HIV,B12 and folate, ammonia, cbc, cmp, heavy metals, ana, pth, lyme.   Addendum 08/15/2015: NM Brain PET: 1. Biparietal and bitemporal cortical hypometabolism is consistent  with Alzheimer's type metabolic pathology. 2. Normal occipital lobe activity would be inconsistent with dementia with Lewy bodies. 3. Normal frontal lobe activity would be inconsistent with frontotemporal dementia.  Sarina Ill, MD  [facility] [address] [place], [place] [address]  Phone [phone] Fax [phone] A total of 25 minutes was spent face-to-face with this patient. Over half this time was spent on counseling patient on the dementia diagnosis and different diagnostic and therapeutic options available.

## 2016-07-23 NOTE — Patient Instructions (Signed)
Remember to drink plenty of fluid, eat healthy meals and do not skip any meals. Try to eat protein with a every meal and eat a healthy snack such as fruit or nuts in between meals. Try to keep a regular sleep-wake schedule and try to exercise daily, particularly in the form of walking, 20-30 minutes a day, if you can.   As far as your medications are concerned, I would like to suggest:  Increase Aricept(Donepezil) to 10 mg daily. In 1-2 weeks if no side effects start Namenda (Memantine) once daily for 2 weeks then twice daily.   I would like to see you back in 1 year, sooner if we need to. Please call us with any interim questions, concerns, problems, updates or refill requests.   Our phone number is [phone]. We also have an after hours call service for urgent matters and there is a physician on-call for urgent questions. For any emergencies you know to call 911 or go to the nearest emergency room   Donepezil tablets What is this medicine? DONEPEZIL (doe NEP e zil) is used to treat mild to moderate dementia caused by Alzheimer's disease. This medicine may be used for other purposes; ask your health care provider or pharmacist if you have questions. COMMON BRAND NAME(S): Aricept What should I tell my health care provider before I take this medicine? They need to know if you have any of these conditions: -asthma or other lung disease -difficulty passing urine -head injury -heart disease -history of irregular heartbeat -liver disease -seizures (convulsions) -stomach or intestinal disease, ulcers or stomach bleeding -an unusual or allergic reaction to donepezil, other medicines, foods, dyes, or preservatives -pregnant or trying to get pregnant -breast-feeding How should I use this medicine? Take this medicine by mouth with a glass of water. Follow the directions on the prescription label. You may take this medicine with or without food. Take this medicine at regular intervals. This  medicine is usually taken before bedtime. Do not take it more often than directed. Continue to take your medicine even if you feel better. Do not stop taking except on your doctor's advice. If you are taking the 23 mg donepezil tablet, swallow it whole; do not cut, crush, or chew it. Talk to your pediatrician regarding the use of this medicine in children. Special care may be needed. Overdosage: If you think you have taken too much of this medicine contact a poison control center or emergency room at once. NOTE: This medicine is only for you. Do not share this medicine with others. What if I miss a dose? If you miss a dose, take it as soon as you can. If it is almost time for your next dose, take only that dose, do not take double or extra doses. What may interact with this medicine? Do not take this medicine with any of the following medications: -certain medicines for fungal infections like itraconazole, fluconazole, posaconazole, and voriconazole -cisapride -dextromethorphan; quinidine -dofetilide -dronedarone -pimozide -quinidine -thioridazine -ziprasidone This medicine may also interact with the following medications: -antihistamines for allergy, cough and cold -atropine -bethanechol -carbamazepine -certain medicines for bladder problems like oxybutynin, tolterodine -certain medicines for Parkinson's disease like benztropine, trihexyphenidyl -certain medicines for stomach problems like dicyclomine, hyoscyamine -certain medicines for travel sickness like scopolamine -dexamethasone -ipratropium -NSAIDs, medicines for pain and inflammation, like ibuprofen or naproxen -other medicines for Alzheimer's disease -other medicines that prolong the QT interval (cause an abnormal heart rhythm) -phenobarbital -phenytoin -rifampin, rifabutin or rifapentine This list may not  describe all possible interactions. Give your health care provider a list of all the medicines, herbs,  non-prescription drugs, or dietary supplements you use. Also tell them if you smoke, drink alcohol, or use illegal drugs. Some items may interact with your medicine. What should I watch for while using this medicine? Visit your doctor or health care professional for regular checks on your progress. Check with your doctor or health care professional if your symptoms do not get better or if they get worse. You may get drowsy or dizzy. Do not drive, use machinery, or do anything that needs mental alertness until you know how this drug affects you. What side effects may I notice from receiving this medicine? Side effects that you should report to your doctor or health care professional as soon as possible: -allergic reactions like skin rash, itching or hives, swelling of the face, lips, or tongue -feeling faint or lightheaded, falls -loss of bladder control -seizures -signs and symptoms of a dangerous change in heartbeat or heart rhythm like chest pain; dizziness; fast or irregular heartbeat; palpitations; feeling faint or lightheaded, falls; breathing problems -signs and symptoms of infection like fever or chills; cough; sore throat; pain or trouble passing urine -signs and symptoms of liver injury like dark yellow or brown urine; general ill feeling or flu-like symptoms; light-colored stools; loss of appetite; nausea; right upper belly pain; unusually weak or tired; yellowing of the eyes or skin -slow heartbeat or palpitations -unusual bleeding or bruising -vomiting Side effects that usually do not require medical attention (report to your doctor or health care professional if they continue or are bothersome): -diarrhea, especially when starting treatment -headache -loss of appetite -muscle cramps -nausea -stomach upset This list may not describe all possible side effects. Call your doctor for medical advice about side effects. You may report side effects to FDA at 1-800-FDA-1088. Where should  I keep my medicine? Keep out of reach of children. Store at room temperature between 15 and 30 degrees C (59 and 86 degrees F). Throw away any unused medicine after the expiration date. NOTE: This sheet is a summary. It may not cover all possible information. If you have questions about this medicine, talk to your doctor, pharmacist, or health care provider.  2017 [employer]/Gold Standard (2015-11-12 21:00:42)   Memantine Tablets What is this medicine? MEMANTINE (MEM an teen) is used to treat dementia caused by Alzheimer's disease. This medicine may be used for other purposes; ask your health care provider or pharmacist if you have questions. COMMON BRAND NAME(S): Namenda What should I tell my health care provider before I take this medicine? They need to know if you have any of these conditions: -difficulty passing urine -kidney disease -liver disease -seizures -an unusual or allergic reaction to memantine, other medicines, foods, dyes, or preservatives -pregnant or trying to get pregnant -breast-feeding How should I use this medicine? Take this medicine by mouth with a glass of water. Follow the directions on the prescription label. You may take this medicine with or without food. Take your doses at regular intervals. Do not take your medicine more often than directed. Continue to take your medicine even if you feel better. Do not stop taking except on the advice of your doctor or health care professional. Talk to your pediatrician regarding the use of this medicine in children. Special care may be needed. Overdosage: If you think you have taken too much of this medicine contact a poison control center or emergency room at once. NOTE: This  medicine is only for you. Do not share this medicine with others. What if I miss a dose? If you miss a dose, take it as soon as you can. If it is almost time for your next dose, take only that dose. Do not take double or extra doses. If you do not take  your medicine for several days, contact your health care provider. Your dose may need to be changed. What may interact with this medicine? -acetazolamide -amantadine -cimetidine -dextromethorphan -dofetilide -hydrochlorothiazide -ketamine -metformin -methazolamide -quinidine -ranitidine -sodium bicarbonate -triamterene This list may not describe all possible interactions. Give your health care provider a list of all the medicines, herbs, non-prescription drugs, or dietary supplements you use. Also tell them if you smoke, drink alcohol, or use illegal drugs. Some items may interact with your medicine. What should I watch for while using this medicine? Visit your doctor or health care professional for regular checks on your progress. Check with your doctor or health care professional if there is no improvement in your symptoms or if they get worse. You may get drowsy or dizzy. Do not drive, use machinery, or do anything that needs mental alertness until you know how this drug affects you. Do not stand or sit up quickly, especially if you are an older patient. This reduces the risk of dizzy or fainting spells. Alcohol can make you more drowsy and dizzy. Avoid alcoholic drinks. What side effects may I notice from receiving this medicine? Side effects that you should report to your doctor or health care professional as soon as possible: -allergic reactions like skin rash, itching or hives, swelling of the face, lips, or tongue -agitation or a feeling of restlessness -depressed mood -dizziness -hallucinations -redness, blistering, peeling or loosening of the skin, including inside the mouth -seizures -vomiting Side effects that usually do not require medical attention (report to your doctor or health care professional if they continue or are bothersome): -constipation -diarrhea -headache -nausea -trouble sleeping This list may not describe all possible side effects. Call your doctor for  medical advice about side effects. You may report side effects to FDA at 1-800-FDA-1088. Where should I keep my medicine? Keep out of the reach of children. Store at room temperature between 15 degrees and 30 degrees C (59 degrees and 86 degrees F). Throw away any unused medicine after the expiration date. NOTE: This sheet is a summary. It may not cover all possible information. If you have questions about this medicine, talk to your doctor, pharmacist, or health care provider.  2017 [employer]vier/Gold Standard (2013-03-14 14:10:42)

## 2016-07-30 ENCOUNTER — Telehealth: Payer: Self-pay | Admitting: Internal Medicine

## 2016-07-30 NOTE — Telephone Encounter (Signed)
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 - Gabriela Burke

## 2016-08-07 ENCOUNTER — Telehealth: Payer: Self-pay

## 2016-08-07 NOTE — Telephone Encounter (Signed)
OV notes fron 07/23/16 faxed to [facility] F # [phone]

## 2017-04-29 IMAGING — CT CT HEAD W/O CM
3 series · 15 of 47 positions shown, 18 images · non-contrast
Comparison: PET CT dated 08/15/2015 and MRI dated 07/26/2015 as
well as MRI dated 07/26/2015

CLINICAL DATA: 65-year-old female with increased agitation and
confusion.

EXAM:
CT HEAD WITHOUT CONTRAST
TECHNIQUE: Contiguous axial images were obtained from the base of the skull
through the vertex without intravenous contrast.

[Series 2: head 5.0 h30s · axial · 0.41mm/px · z∈[-196,-71]mm · 9 of 30 slices shown, 12 images]
[im 3/30  brain]
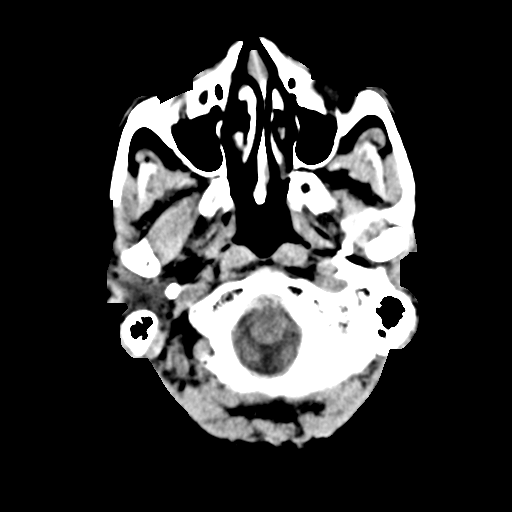
[im 3/30  bone]
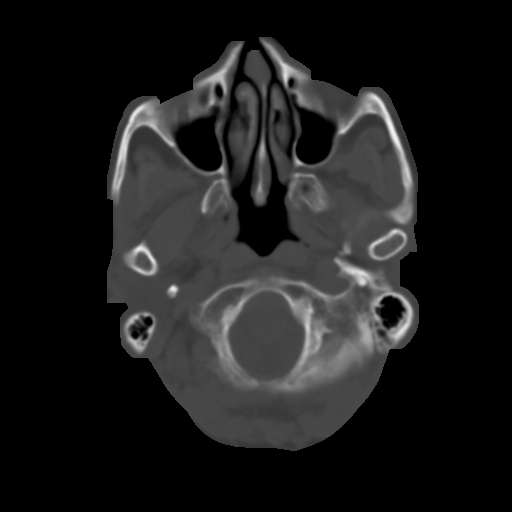
[im 6/30  brain]
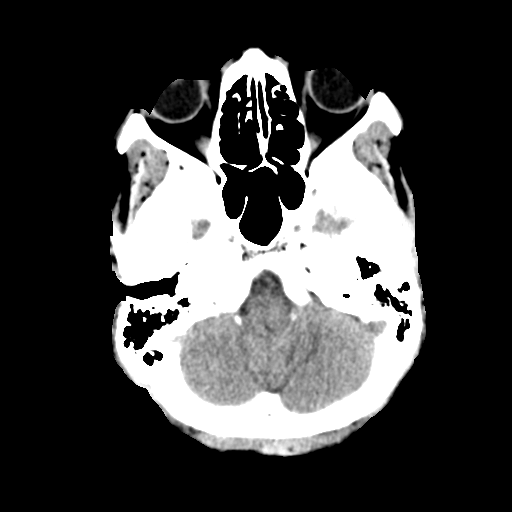
[im 9/30  brain]
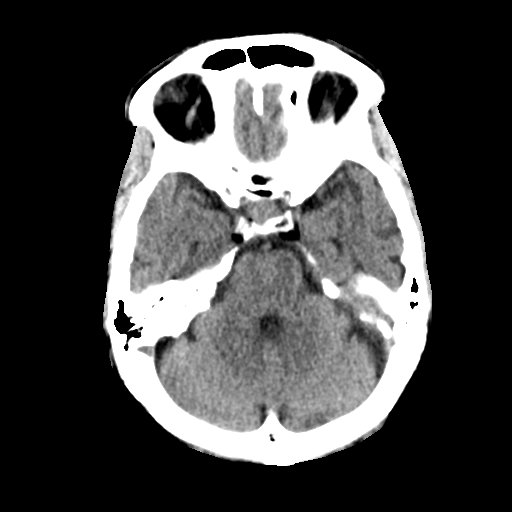
[im 12/30  brain]
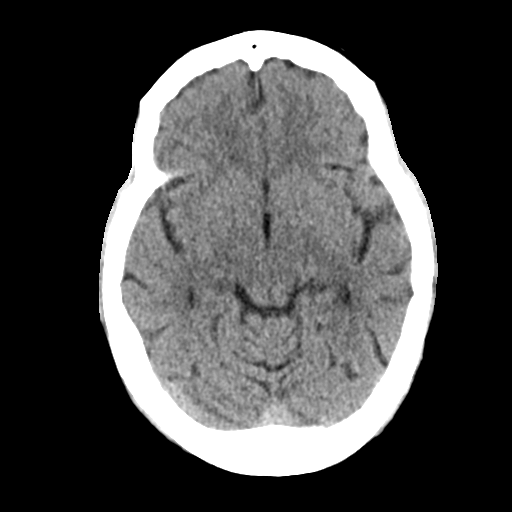
[im 16/30  brain]
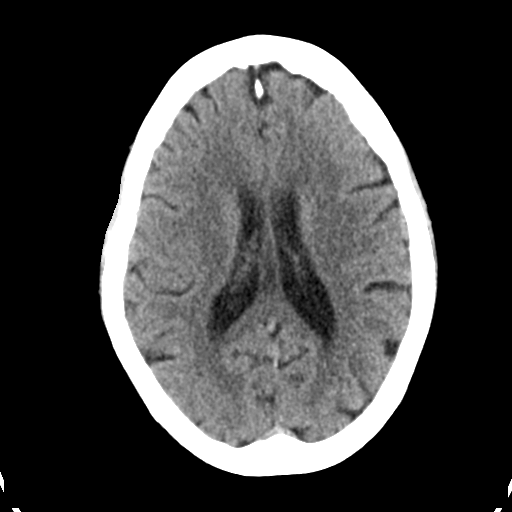
[im 16/30  bone]
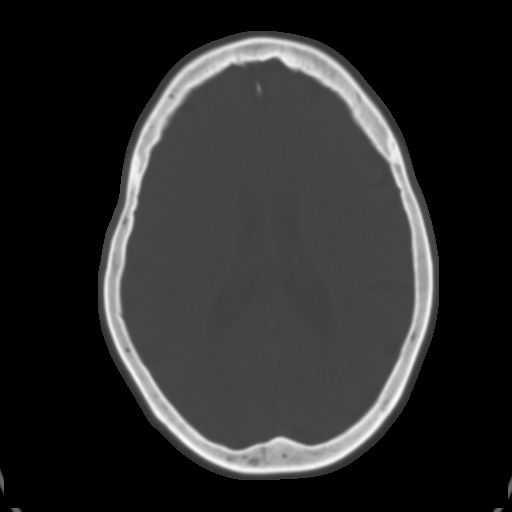
[im 19/30  brain]
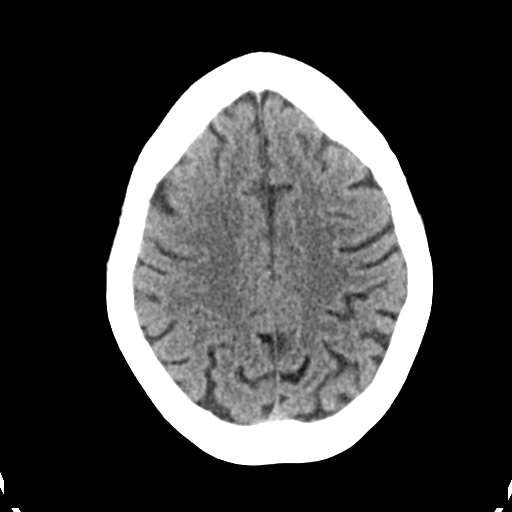
[im 22/30  brain]
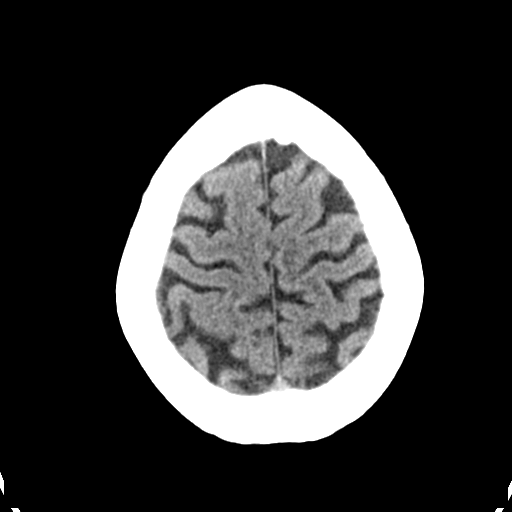
[im 25/30  brain]
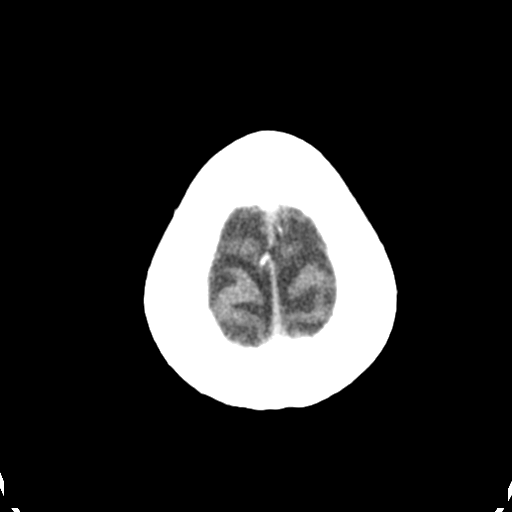
[im 28/30  brain]
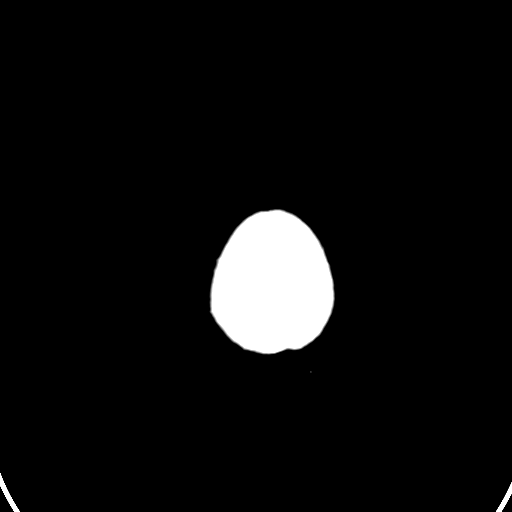
[im 28/30  bone]
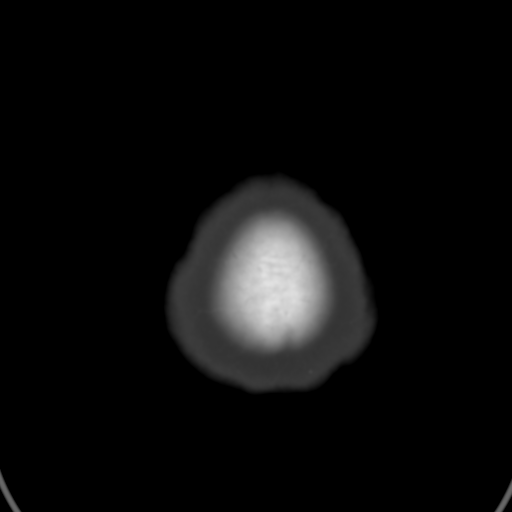

[Series 4: head 3.0 mpr · coronal · 0.29mm/px · 3 of 67 slices shown (1 of 2)]
[im 23/67  brain]
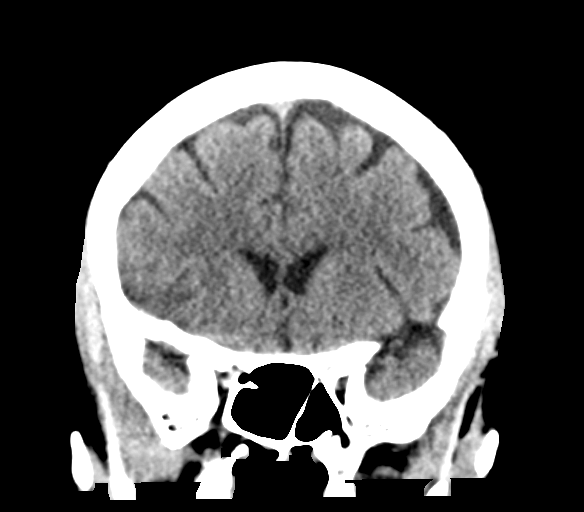
[im 30/67  brain]
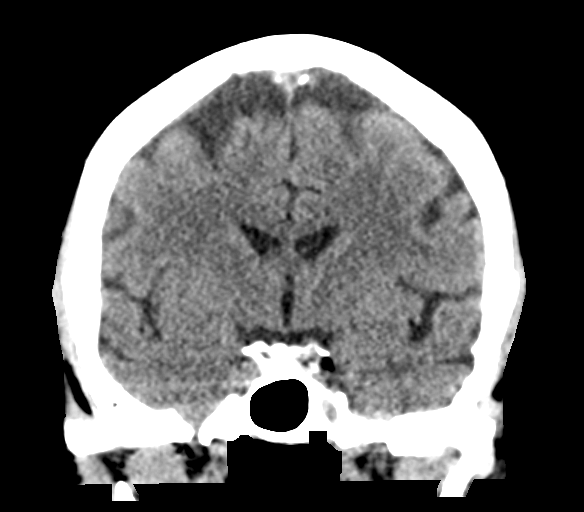
[im 37/67  brain]
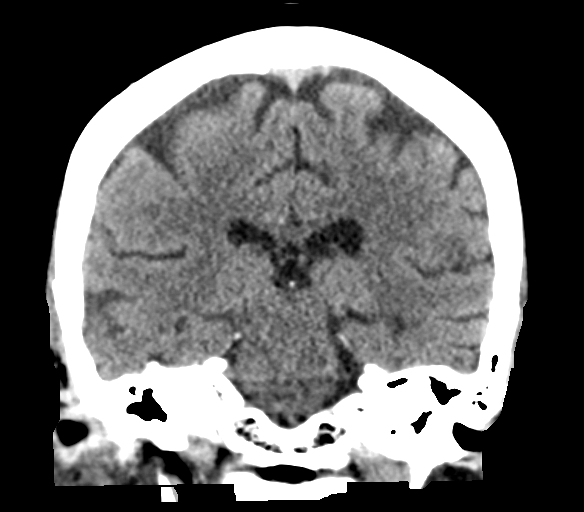

[Series 5: head 3.0 mpr · sagittal · 0.31mm/px · 3 of 51 slices shown (2 of 2)]
[im 17/51  brain]
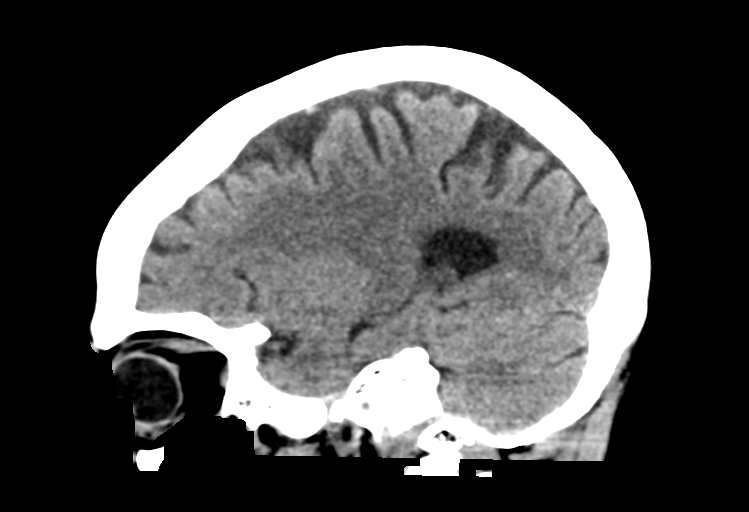
[im 26/51  brain]
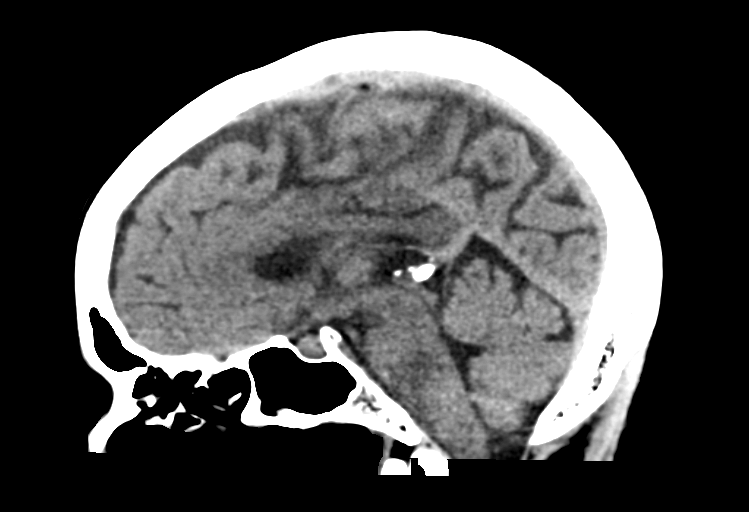
[im 34/51  brain]
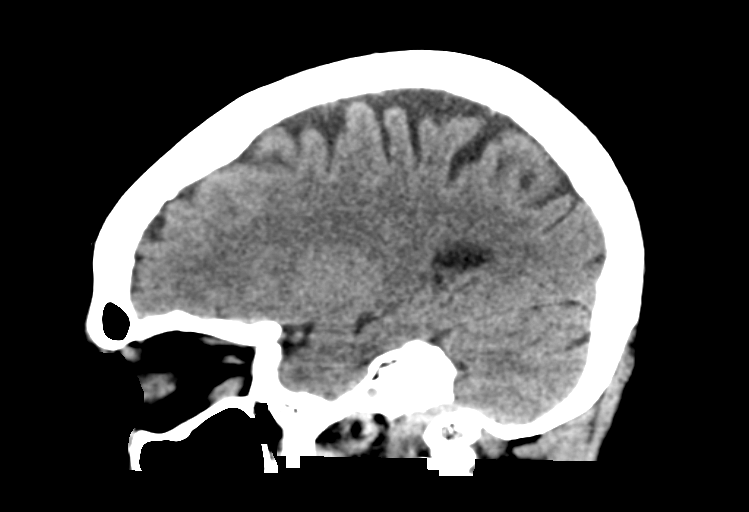

[15 of 47 positions shown; findings below may reference images not displayed]

FINDINGS: Brain: The ventricles and sulci are appropriate in size for
patient's age. Minimal periventricular and deep white matter chronic
microvascular ischemic changes noted. There is no acute intracranial
hemorrhage. No mass effect or midline shift noted. No extra-axial
fluid collection. Focal area of hypodensity in the pons seen on the
sagittal images (series 5, image 27) most likely artifactual. MRI is
recommended if there is high clinical concern for an acute infarct.

Vascular: No hyperdense vessel or unexpected calcification.

Skull: Normal. Negative for fracture or focal lesion.

Sinuses/Orbits: No acute finding.

Other: None
IMPRESSION: No acute intracranial hemorrhage.

Focal area of hypodensity in the brainstem most likely artifactual.
MRI may provide better evaluation there is clinical concern for
acute infarct.

## 2017-07-22 ENCOUNTER — Ambulatory Visit: Payer: Medicare Other | Admitting: Neurology

## 2017-08-05 ENCOUNTER — Ambulatory Visit: Payer: Medicare Other | Admitting: Neurology

## 2017-08-05 ENCOUNTER — Encounter: Payer: Self-pay | Admitting: Neurology

## 2017-08-05 VITALS — BP 132/75 | HR 81 | Ht 64.0 in | Wt 152.2 lb

## 2017-08-05 DIAGNOSIS — F0281 Dementia in other diseases classified elsewhere with behavioral disturbance: Secondary | ICD-10-CM | POA: Diagnosis not present

## 2017-08-05 DIAGNOSIS — G300 Alzheimer's disease with early onset: Secondary | ICD-10-CM

## 2017-08-05 DIAGNOSIS — F02818 Dementia in other diseases classified elsewhere, unspecified severity, with other behavioral disturbance: Secondary | ICD-10-CM

## 2017-08-05 MED ORDER — MEMANTINE HCL 10 MG PO TABS: 10.0000 mg | ORAL_TABLET | Freq: Two times a day (BID) | ORAL | 4 refills | Status: AC

## 2017-08-05 MED ORDER — DONEPEZIL HCL 10 MG PO TABS: 10.0000 mg | ORAL_TABLET | Freq: Every day | ORAL | 4 refills | Status: AC

## 2017-08-05 NOTE — Progress Notes (Signed)
[facility]    Provider:  Dr Jaynee Eagles Referring Provider: Renata Caprice, DO, Primary Care Physician:  Renata Caprice, DO  CC: dementia with behavioral disturbances  Interval history 08/05/2017: Patient here for follow up of dementia. Patient here with caretaker who provides most information. Her memory is slowly progressively slowly worsening. Aricept not on med list, will restart, doing well on Namenda, no falls, doing well, eating well good appetite, no significant behavioral problems, she is in a dementia unit, she participates in activities, sleeping well all night.  She has a pcp.   Interval history: She is in the [facility]. It is in [place] it is almost an hour away. She is doing well. She helps the other patients. No significant agitation, she is doing well.   Interval History 11/20/2015: 67 year old with progressed Alzheimer's Dementia. 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 Sister tries to help but patient has behavioral episodes, refuses medications. Patient is always agitated when she is here, nothing changed today. Sister tries very hard. Discussed sister's situation, there is caregiver burnout. Sister has pictures of patient's house, dirty and cluttered appears very unsafe. Reviewed in their medicine brain PET scan consistent with Alzheimer's type metabolic pathology. Patient says she knows she has dementia. Patient did not want to move from the home. Sr. is in the process of obtaining guardianship.  Addendum 08/15/2015: NM Brain PET: 1. Biparietal and bitemporal cortical hypometabolism is consistent with Alzheimer's type metabolic pathology. 2. Normal occipital lobe activity would be inconsistent with dementia with Lewy bodies. 3. Normal frontal lobe activity would be inconsistent with frontotemporal dementia.   Interval history 07/26/2015; Patient with significant history of memory loss, behavioral disturbances, probable psychiatric disease. Most history from  sister. Patient has been diagnosed with dementia with behavioral disturbances. There appears to psychiatric component as well and unsure how much the memory loss and behavior is due to psychiatric causes vs a dementing process. MRi of the brain was unremarkable in 2015 and given the level of memory impairment will repeat MRi of the brain and see if there is progressive atrophy or any other changes we would expect. At this point we would likely see at least some atrophy given the level of memory loss. If mRI of the brain is still largely unremarkable appearing, will order a PET scan and also needs psychiatric evaluation. Neurocognitive testing would be helpful if patient would agree. There is a social situation and sister tries to care for her and patient is very angry, agitated, disorganized. Patient screams and hollars all the time per sister, patient leaves cat food around the house and it is attracting rats. Patient is refusing all her medications. Patient pretends that she takes medications and sister finds the pills around the house. Sister manages all the finances. No significant progression in memory loss since last being seen, patient is stable per sister and would expect progressive decline if there is a dementing process. Patient has been referred to psychiatry in the past and did not go. Patient will not take a bath per sister. Brother has schizophrenia. Dementia in the mother. Appears Dr. Raeford Razor has called adult protective services recently as noted in the chart  Sister report: Needed to speak with sister separately as they are fighting: Per sister; patient is hallucinating. Sister is POA. . Patient is having delusions and hallucinations. She asks her sister where the people went and sister says there were no people around. Patient thinks people are stealing her money. She was  seeing people who were not there. Her house is a mess. Sister called adult protective services and they did not come.  Patient hurt her leg, sister doesn't know what happened. Patient doesn't use the stove, she just puts everything in the microwave. Patient had a fall but unknown why. Patient is a Ship broker.   Patient report: she says she cannot perform everyday tasks. She says no one is giving her medication. She does not know what medications she is supposed to take. She says she doesn't t have a problem taking meds but she has a problem with people asking her about the meds. Then she says someone else gives her medications. Patient says her sister does all her bills. Patient says she can't take care of her bills. Patient says she feels very paranoid about people taking her money. Patient says she wants to scream all the time. Patient says she sits on the porch with her cats all day. She denies being a Ship broker.   Psychiatric medications taken in the past per chart review: Depakote, Prozac,   HPI original visit 08/08/2014: MEHEK GREGA is a 67 y.o. female with a PMHxof HTN, HLD who is here as a referral from Dr. Raeford Razor for dementia. She is here with sister who provides much of the history as patient is apoor historian. review of notes shows that she has scored an 8/10 on a previous MoCA, sister is trying to care for patient but patient refuses medications, patient's house is reportedly out of order, and sounds like patient is adversarial to her sister and not ready to accept that she needs help. Patient is here with sister today and patient is agitated towards sister. Sister says she is just trying to help and remains calmer. Patient readily admits she has dementia and reports that she doesn't remember the things she should. Patient says she gets agitated and says she knows very well that she has dementia. Patient says her memory loss started long afo, and she is aware. Patient says that she never thought that she would have dementia, she has tried to keep herself moving all her life and is very upset by her  condition. Patient knows she has lost her memories on certain things. Patient gets upset because she can't find things and she won't always be herself. Patient lives alone is in the house. Sister says patient is very angry about what is happening to her and sister is agitated often. Sister doesn't want to get sick and is tired of being yelled at. Sister is trying to support the patient. Sister has a full time job as well. Patient is not taking her medicine. Denies hallucinations. Sister is very concerned about her sister and is trying to care for her, appears very caring. Patient tears up today about her situation.   Reviewed notes, labs and imaging from outside physicians, which showed:  TSH 1/206 WNL,  Recent labs: BMP WNL, HgbA1c 6.2,  B12 in 07/2012 500 RPR 07/2012 NR   MRi of the brain 2015 (reviewed images and agree with results below) IMPRESSION: 1. The pituitary gland is enlarged in the midline, measuring up to 9.5 mm on the sagittal images. Correlate with hormonal testing. 2. No significant distortion of the cavernous sinus or optic chiasm. 3. Otherwise unremarkable MRI of the brain.    Review of Systems: Patient complains of symptoms per HPI as well as the following symptoms: dementia. Pertinent negatives per HPI. All others negative.  Social History   Socioeconomic History  .  Marital status: Single    Spouse name: Not on file  . Number of children: 0  . Years of education: 35  . Highest education level: Not on file  Social Needs  . Financial resource strain: Not on file  . Food insecurity - worry: Not on file  . Food insecurity - inability: Not on file  . Transportation needs - medical: Not on file  . Transportation needs - non-medical: Not on file  Occupational History  . Not on file  Tobacco Use  . Smoking status: Never Smoker  . Smokeless tobacco: Never Used  Substance and Sexual Activity  . Alcohol use: No  . Drug use: No  . Sexual activity: Not on  file  Other Topics Concern  . Not on file  Social History Narrative   Currently living at [facility]      Caffeine: occassional use    Family History  Problem Relation Age of Onset  . Dementia Mother   . Schizophrenia Brother   . Stroke Brother   . Stroke Brother     Past Medical History:  Diagnosis Date  . Abnormal weight loss   . Agitation   . Alzheimer's disease   . Alzheimer's disease   . Anxiety disorder   . Chronic pain   . Dementia   . DM (diabetes mellitus) ([place])   . Fracture, radius   . HLD (hyperlipidemia)   . HTN (hypertension)   . Localized edema   . Major depressive disorder, recurrent, unspecified ([place])   . Osteoarthritis of right knee   . Other constipation   . Restlessness and agitation   . Type 2 diabetes mellitus ([place])   . Unilateral primary osteoarthritis, right knee   . Unspecified dementia with behavioral disturbance   . Unspecified fracture of lower end of unspecified ulna, initial encounter for closed fracture   . Unspecified hemorrhoids   . Unsteadiness on feet     Past Surgical History:  Procedure Laterality Date  . ROTATOR CUFF REPAIR    . TOTAL ABDOMINAL HYSTERECTOMY      Current Outpatient Medications  Medication Sig Dispense Refill  . acetaminophen (TYLENOL) 500 MG tablet Take 500 mg by mouth 2 (two) times daily as needed (pain).    . CALCIUM PO Take 500 mg by mouth daily.    . Divalproex Sodium (DEPAKOTE ER PO) Take 750 mg by mouth 3 (three) times daily.    Marland Kitchen docusate sodium (COLACE) 100 MG capsule Take 100 mg by mouth 2 (two) times daily.    . hydrochlorothiazide (HYDRODIURIL) 12.5 MG tablet Take 1 tablet (12.5 mg total) by mouth daily. Take 1 tab by mouth every morning 30 tablet 5  . memantine (NAMENDA) 10 MG tablet Take 1 tablet (10 mg total) by mouth 2 (two) times daily. Start with one pill daily and in 2 weeks increase to twice daily 180 tablet 4  . PRESCRIPTION MEDICATION Inject 0.1 mLs into the skin. Tubersol  Solution Every 12 months, Annual PPD skin test    . sertraline (ZOLOFT) 100 MG tablet Take 100 mg by mouth daily.    Marland Kitchen UNABLE TO FIND Med Name: Eucerin Cream apply to back topically every shift for eczema     No current facility-administered medications for this visit.     Allergies as of 08/05/2017 - Review Complete 08/05/2017  Allergen Reaction Noted  . Metformin and related Nausea Only 12/08/2013  . Latex Other (See Comments) 05/08/2008    Vitals: BP 132/75 (BP  Location: Right Arm, Patient Position: Sitting)   Pulse 81   Ht 5\' 4"  (1.626 m)   Wt 152 lb 3.2 oz (69 kg)   BMI 26.13 kg/m  Last Weight:  Wt Readings from Last 1 Encounters:  08/05/17 152 lb 3.2 oz (69 kg)   Last Height:   Ht Readings from Last 1 Encounters:  08/05/17 5\' 4"  (1.626 m)   Physical exam: Exam: Gen: NAD, conversant, well nourised, obese, well groomed                     CV: RRR, no MRG. No Carotid Bruits. No peripheral edema, warm, nontender Eyes: Conjunctivae clear without exudates or hemorrhage  Neuro: Detailed Neurologic Exam  Speech:    Speech is without aphasia Cognition:     MMSE - Mini Mental State Exam 08/05/2017 07/23/2016 07/26/2015  Orientation to time 0 0 0  Orientation to Place 2 3 4   Registration 3 3 3   Attention/ Calculation 0 0 0  Recall 0 2 0  Language- name 2 objects 1 2 2   Language- repeat 1 1 1   Language- follow 3 step command 3 3 1   Language- read & follow direction 1 1 1   Write a sentence 0 0 1  Copy design 0 0 1  Total score 11 15 14    Cranial Nerves:    The pupils are equal, round, and reactive to light.  Visual fields are full to finger confrontation. Extraocular movements are intact. Trigeminal sensation is intact and the muscles of mastication are normal. The face is symmetric. The palate elevates in the midline. Hearing intact. Voice is normal. Shoulder shrug is normal. The tongue has normal motion without fasciculations.   Motor Observation:    No asymmetry,  no atrophy, and no involuntary movements noted. Tone:    Normal muscle tone.    Posture:    Posture is normal. normal erect    Strength:    Strength is V/V in the upper and lower limbs.      Sensation: intact to LT      Assessment/Plan: 67 year old female with Alzheimer's dementia doing well, she is on Namenda and Aricept  Today's history and physical demonstrated very substantial and measurable cognitive dysfunction. It is very clear she does not have the capacity to make informed and appropriate decisions on her healthcare and finances. It is clear that patient does not comprehend the degree of risks that she may be in with reported fall, reported neglect within the home and medication non-compliance.   Continue Aricept and Donepezil for progressive dementia  Behavioral disturbances stable, continue depakote  As far as your medications are concerned, I would like to suggest:  I would like to see you back in 1 year, sooner if we need to. Please call us with any interim questions, concerns, problems, updates or refill requests.   - Extensive lab testing completed: TSH,RPR,HIV,B12 and folate, ammonia, cbc, cmp, heavy metals, ana, pth, lyme.   Addendum 08/15/2015: NM Brain PET: 1. Biparietal and bitemporal cortical hypometabolism is consistent with Alzheimer's type metabolic pathology. 2. Normal occipital lobe activity would be inconsistent with dementia with Lewy bodies. 3. Normal frontal lobe activity would be inconsistent with frontotemporal dementia.  Sarina Ill, MD  [facility] [address] [place], [place] [address]  Phone [phone] Fax [phone] A total of 25 minutes was spent face-to-face with this patient. Over half this time was spent on counseling patient on the dementia diagnosis  and different diagnostic and therapeutic options available.

## 2017-08-05 NOTE — Patient Instructions (Signed)
Continue Donepezil, Depakote and Memantine

## 2017-08-06 ENCOUNTER — Telehealth: Payer: Self-pay | Admitting: Neurology

## 2017-08-06 NOTE — Telephone Encounter (Signed)
Pt's sister request a call back to discuss the OV yesterday, she was unable to attend

## 2017-08-06 NOTE — Telephone Encounter (Signed)
Returned [employer] (on DPR) and LVM asking for call back. Left office number in message.

## 2017-08-06 NOTE — Telephone Encounter (Signed)
Pt's sister returned call. Discussed her questions. Informed her that per visit yesterday, patient is happy, stable, eating well, no significant behavior problems, sleeping well, participating in activities. Dr. Jaynee Eagles is restarting Aricept in addition to the [place] she is taking. Follow up in one year. She verbalized appreciation and understanding.

## 2018-04-05 ENCOUNTER — Ambulatory Visit (INDEPENDENT_AMBULATORY_CARE_PROVIDER_SITE_OTHER): Payer: Medicare Other | Admitting: Neurology

## 2018-04-05 ENCOUNTER — Encounter

## 2018-04-05 ENCOUNTER — Encounter: Payer: Self-pay | Admitting: Neurology

## 2018-04-05 VITALS — BP 117/76 | HR 91 | Ht 64.0 in | Wt 133.0 lb

## 2018-04-05 DIAGNOSIS — G300 Alzheimer's disease with early onset: Secondary | ICD-10-CM | POA: Diagnosis not present

## 2018-04-05 DIAGNOSIS — F0281 Dementia in other diseases classified elsewhere with behavioral disturbance: Secondary | ICD-10-CM | POA: Diagnosis not present

## 2018-04-05 NOTE — Progress Notes (Signed)
[facility]    Provider:  Dr Jaynee Eagles Referring Provider: Renata Caprice, DO, Primary Care Physician:  Renata Caprice, DO  CC: dementia with behavioral disturbances  Interval history 04/05/2018: No issues. No behavioral issues. She is very active, seems happy. Discussed dementia and clinical trals. Patient has friends at her facility, doing well, behavioral issues are well controlled and redirectable. Weight is maintaining. Good appetite. She is incontinent and she is self conscious. She is eating well. No falls. Not choking. She is here with sister who provides all information. Patient continues to decline. On Aricept or Namenda at this time, likely at this level of dementia will not provide any significant clinical results  but patient's sister would lie to continue meds.  Interval history 08/05/2017: Patient here for follow up of dementia. Patient here with caretaker who provides most information. Her memory is slowly progressively slowly worsening. Aricept not on med list, will restart, doing well on Namenda, no falls, doing well, eating well good appetite, no significant behavioral problems, she is in a dementia unit, she participates in activities, sleeping well all night.  She has a pcp.   Interval history: She is in the [facility]. It is in [place] it is almost an hour away. She is doing well. She helps the other patients. No significant agitation, she is doing well.   Interval History 11/20/2015: 67 year old with progressed Alzheimer's Dementia. 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 Sister tries to help but patient has behavioral episodes, refuses medications. Patient is always agitated when she is here, nothing changed today. Sister tries very hard. Discussed sister's situation, there is caregiver burnout. Sister has pictures of patient's house, dirty and cluttered appears very unsafe. Reviewed in their medicine brain PET scan consistent with Alzheimer's type metabolic  pathology. Patient says she knows she has dementia. Patient did not want to move from the home. Sr. is in the process of obtaining guardianship.  Addendum 08/15/2015: NM Brain PET: 1. Biparietal and bitemporal cortical hypometabolism is consistent with Alzheimer's type metabolic pathology. 2. Normal occipital lobe activity would be inconsistent with dementia with Lewy bodies. 3. Normal frontal lobe activity would be inconsistent with frontotemporal dementia.   Interval history 07/26/2015; Patient with significant history of memory loss, behavioral disturbances, probable psychiatric disease. Most history from sister. Patient has been diagnosed with dementia with behavioral disturbances. There appears to psychiatric component as well and unsure how much the memory loss and behavior is due to psychiatric causes vs a dementing process. MRi of the brain was unremarkable in 2015 and given the level of memory impairment will repeat MRi of the brain and see if there is progressive atrophy or any other changes we would expect. At this point we would likely see at least some atrophy given the level of memory loss. If mRI of the brain is still largely unremarkable appearing, will order a PET scan and also needs psychiatric evaluation. Neurocognitive testing would be helpful if patient would agree. There is a social situation and sister tries to care for her and patient is very angry, agitated, disorganized. Patient screams and hollars all the time per sister, patient leaves cat food around the house and it is attracting rats. Patient is refusing all her medications. Patient pretends that she takes medications and sister finds the pills around the house. Sister manages all the finances. No significant progression in memory loss since last being seen, patient is stable per sister and would expect progressive decline if there is  a dementing process. Patient has been referred to psychiatry in the past and did not go.  Patient will not take a bath per sister. Brother has schizophrenia. Dementia in the mother. Appears Dr. Raeford Razor has called adult protective services recently as noted in the chart  Sister report: Needed to speak with sister separately as they are fighting: Per sister; patient is hallucinating. Sister is POA. . Patient is having delusions and hallucinations. She asks her sister where the people went and sister says there were no people around. Patient thinks people are stealing her money. She was seeing people who were not there. Her house is a mess. Sister called adult protective services and they did not come. Patient hurt her leg, sister doesn't know what happened. Patient doesn't use the stove, she just puts everything in the microwave. Patient had a fall but unknown why. Patient is a Ship broker.   Patient report: she says she cannot perform everyday tasks. She says no one is giving her medication. She does not know what medications she is supposed to take. She says she doesn't t have a problem taking meds but she has a problem with people asking her about the meds. Then she says someone else gives her medications. Patient says her sister does all her bills. Patient says she can't take care of her bills. Patient says she feels very paranoid about people taking her money. Patient says she wants to scream all the time. Patient says she sits on the porch with her cats all day. She denies being a Ship broker.   Psychiatric medications taken in the past per chart review: Depakote, Prozac,   HPI original visit 08/08/2014: ANWYN KRIEGEL is a 67 y.o. female with a PMHxof HTN, HLD who is here as a referral from Dr. Raeford Razor for dementia. She is here with sister who provides much of the history as patient is apoor historian. review of notes shows that she has scored an 8/10 on a previous MoCA, sister is trying to care for patient but patient refuses medications, patient's house is reportedly out of order,  and sounds like patient is adversarial to her sister and not ready to accept that she needs help. Patient is here with sister today and patient is agitated towards sister. Sister says she is just trying to help and remains calmer. Patient readily admits she has dementia and reports that she doesn't remember the things she should. Patient says she gets agitated and says she knows very well that she has dementia. Patient says her memory loss started long afo, and she is aware. Patient says that she never thought that she would have dementia, she has tried to keep herself moving all her life and is very upset by her condition. Patient knows she has lost her memories on certain things. Patient gets upset because she can't find things and she won't always be herself. Patient lives alone is in the house. Sister says patient is very angry about what is happening to her and sister is agitated often. Sister doesn't want to get sick and is tired of being yelled at. Sister is trying to support the patient. Sister has a full time job as well. Patient is not taking her medicine. Denies hallucinations. Sister is very concerned about her sister and is trying to care for her, appears very caring. Patient tears up today about her situation.   Reviewed notes, labs and imaging from outside physicians, which showed:  TSH 1/206 WNL,  Recent labs: BMP WNL, HgbA1c  6.2,  B12 in 07/2012 500 RPR 07/2012 NR   MRi of the brain 2015 (reviewed images and agree with results below) IMPRESSION: 1. The pituitary gland is enlarged in the midline, measuring up to 9.5 mm on the sagittal images. Correlate with hormonal testing. 2. No significant distortion of the cavernous sinus or optic chiasm. 3. Otherwise unremarkable MRI of the brain.    Review of Systems: Patient complains of symptoms per HPI as well as the following symptoms: dementia. Pertinent negatives per HPI. All others negative.  Social History   Socioeconomic  History  . Marital status: Single    Spouse name: Not on file  . Number of children: 0  . Years of education: 73  . Highest education level: Not on file  Occupational History  . Not on file  Social Needs  . Financial resource strain: Not on file  . Food insecurity:    Worry: Not on file    Inability: Not on file  . Transportation needs:    Medical: Not on file    Non-medical: Not on file  Tobacco Use  . Smoking status: Never Smoker  . Smokeless tobacco: Never Used  Substance and Sexual Activity  . Alcohol use: No  . Drug use: No  . Sexual activity: Not on file  Lifestyle  . Physical activity:    Days per week: Not on file    Minutes per session: Not on file  . Stress: Not on file  Relationships  . Social connections:    Talks on phone: Not on file    Gets together: Not on file    Attends religious service: Not on file    Active member of club or organization: Not on file    Attends meetings of clubs or organizations: Not on file    Relationship status: Not on file  . Intimate partner violence:    Fear of current or ex partner: Not on file    Emotionally abused: Not on file    Physically abused: Not on file    Forced sexual activity: Not on file  Other Topics Concern  . Not on file  Social History Narrative   Currently living at [facility]      Caffeine: occassional use    Family History  Problem Relation Age of Onset  . Dementia Mother   . Schizophrenia Brother   . Stroke Brother   . Stroke Brother   . Heart Problems Sister     Past Medical History:  Diagnosis Date  . Abnormal weight loss   . Agitation   . Alzheimer's disease ([place])   . Alzheimer's disease ([place])   . Anxiety disorder   . Chronic pain   . Dementia ([place])   . DM (diabetes mellitus) ([place])   . Fracture, radius   . HLD (hyperlipidemia)   . HTN (hypertension)   . Localized edema   . Major depressive disorder, recurrent, unspecified ([place])   . Osteoarthritis of right knee   . Other  constipation   . Restlessness and agitation   . Type 2 diabetes mellitus ([place])   . Unilateral primary osteoarthritis, right knee   . Unspecified dementia with behavioral disturbance ([place])   . Unspecified fracture of lower end of unspecified ulna, initial encounter for closed fracture   . Unspecified hemorrhoids   . Unsteadiness on feet     Past Surgical History:  Procedure Laterality Date  . ROTATOR CUFF REPAIR    . TOTAL ABDOMINAL HYSTERECTOMY  Current Outpatient Medications  Medication Sig Dispense Refill  . acetaminophen (TYLENOL) 325 MG tablet Take 650 mg by mouth every 6 (six) hours as needed.     . ALPRAZolam (XANAX) 0.5 MG tablet Take 0.25 mg by mouth every 8 (eight) hours as needed for anxiety.    Marland Kitchen CALCIUM PO Take 500 mg by mouth daily.    . Cholecalciferol (VITAMIN D PO) Take 4,000 Units by mouth daily.    . Divalproex Sodium (DEPAKOTE ER PO) Take by mouth. 750 mg every morning and 500 mg in the afternoon and 500 mg at bedtime    . docusate sodium (COLACE) 100 MG capsule Take 100 mg by mouth 2 (two) times daily.    . Loperamide HCl (IMODIUM PO) Take by mouth as needed.    . mirtazapine (REMERON) 7.5 MG tablet Take 7.5 mg by mouth at bedtime.    . donepezil (ARICEPT) 10 MG tablet Take 1 tablet (10 mg total) by mouth at bedtime. (Patient not taking: Reported on 04/05/2018) 90 tablet 4  . hydrochlorothiazide (HYDRODIURIL) 12.5 MG tablet Take 1 tablet (12.5 mg total) by mouth daily. Take 1 tab by mouth every morning (Patient not taking: Reported on 04/05/2018) 30 tablet 5  . memantine (NAMENDA) 10 MG tablet Take 1 tablet (10 mg total) by mouth 2 (two) times daily. (Patient not taking: Reported on 04/05/2018) 180 tablet 4  . PRESCRIPTION MEDICATION Inject 0.1 mLs into the skin. Tubersol Solution Every 12 months, Annual PPD skin test    . sertraline (ZOLOFT) 100 MG tablet Take 100 mg by mouth daily.    Marland Kitchen UNABLE TO FIND Med Name: Eucerin Cream apply to back topically every  shift for eczema     No current facility-administered medications for this visit.     Allergies as of 04/05/2018 - Review Complete 04/05/2018  Allergen Reaction Noted  . Metformin and related Nausea Only 12/08/2013  . Latex Other (See Comments) 05/08/2008    Vitals: BP 117/76 (BP Location: Right Arm, Patient Position: Sitting)   Pulse 91   Ht 5\' 4"  (1.626 m)   Wt 133 lb (60.3 kg)   BMI 22.83 kg/m  Last Weight:  Wt Readings from Last 1 Encounters:  04/05/18 133 lb (60.3 kg)   Last Height:   Ht Readings from Last 1 Encounters:  04/05/18 5\' 4"  (1.626 m)   Physical exam: Exam: Gen: NAD, conversant, well nourised, obese, well groomed                     CV: RRR, no MRG. No Carotid Bruits. No peripheral edema, warm, nontender Eyes: Conjunctivae clear without exudates or hemorrhage  Neuro: Detailed Neurologic Exam  Speech:    Speech is without aphasia Cognition:     MMSE - Mini Mental State Exam 04/05/2018 08/05/2017 07/23/2016  Orientation to time 0 0 0  Orientation to Place 1 2 3   Registration 3 3 3   Attention/ Calculation 0 0 0  Recall 0 0 2  Language- name 2 objects 0 1 2  Language- repeat 0 1 1  Language- follow 3 step command 1 3 3   Language- read & follow direction 0 1 1  Write a sentence 0 0 0  Copy design 0 0 0  Total score 5 11 15    Cranial Nerves:    The pupils are equal, round, and reactive to light. Trigeminal sensation is intact and the muscles of mastication are normal. The face is symmetric. The palate elevates in  the midline. Hearing intact. Voice is normal. Shoulder shrug is normal. The tongue has normal motion without fasciculations.   Motor Observation:    No asymmetry, no atrophy, and no involuntary movements noted. Tone:    Normal muscle tone.    Posture:    Posture is normal. normal erect    Strength:    Strength is V/V in the upper and lower limbs.      Sensation: intact to LT      Assessment/Plan: 67 year old female with  Alzheimer's dementia in a memory unit, is happy there, continues to decline cognitively.   Today's history and physical demonstrated very substantial and measurable cognitive dysfunction. It is very clear she does not have the capacity to make informed and appropriate decisions on her healthcare and finances. It is clear that patient does not comprehend the degree of risks that she may be in with reported fall, reported neglect within the home and medication non-compliance.   She is here with sister who provides all information. Patient continues to decline. On Aricept or Namenda at this time, likely at this level of dementia will not provide any significant clinical results but patient's sister would lie to continue meds.  Behavioral disturbances stable, continue depakote and xanax prn, zoloft and mirtazapine  I would like to see you back in 1 year, sooner if we need to. Please call us with any interim questions, concerns, problems, updates or refill requests.   - Extensive lab testing completed in the past: TSH,RPR,HIV,B12 and folate, ammonia, cbc, cmp, heavy metals, ana, pth, lyme.   Addendum 08/15/2015: NM Brain PET: 1. Biparietal and bitemporal cortical hypometabolism is consistent with Alzheimer's type metabolic pathology. 2. Normal occipital lobe activity would be inconsistent with dementia with Lewy bodies. 3. Normal frontal lobe activity would be inconsistent with frontotemporal dementia.  Sarina Ill, MD  [facility] [address] [place], [place] [address]  Phone [phone] Fax [phone] A total of 25 minutes was spent face-to-face with this patient. Over half this time was spent on counseling patient on the  1. Early onset Alzheimer's disease with behavioral disturbance ([place])    diagnosis and different diagnostic and therapeutic options available.

## 2018-04-05 NOTE — Patient Instructions (Signed)
Alzheimer Disease Alzheimer disease is a brain disease that affects memory, thinking, and behavior. People with Alzheimer disease lose mental abilities, and the disease gets worse over time. Survival with Alzheimer disease ranges from several years to as long as 20 years. What are the causes? This condition develops when a protein called beta-amyloid forms deposits in the brain. It is not known what causes these deposits to form. What increases the risk? This condition is more likely to develop in people who:  Are elderly.  Have a family history of dementia.  Have had a brain injury.  Have heart or blood vessel disease.  Have had a stroke.  Have high blood pressure or high cholesterol.  Have diabetes.  What are the signs or symptoms? Symptoms of this condition happen in three stages, which often overlap. Early stage In this stage, you may continue to be independent. You may still be able to drive, work, and be social. Symptoms in this stage include:  Minor memory problems, such as forgetting a name or what you read.  Difficulty with: ? Paying attention. ? Communicating. ? Doing familiar tasks. ? Learning new things.  Needing more time to do daily activities.  Anxiety.  Social withdrawal.  Loss of motivation.  Moderate stage In this stage, you will start to need care. This stage usually lasts the longest. Symptoms in this stage include:  Difficulty with expressing thoughts.  Memory loss that affects daily life. This can include forgetting: ? Your address or phone number. ? Events that have happened. ? Parts of your personal history, like where you went to school.  Confusion about where you are or what time it is.  Difficulty in judging distance.  Changes in personality, mood, and behavior. You may be moody, irritable, angry, frustrated, fearful, anxious, or suspicious.  Poor reasoning and judgment.  Delusions or hallucinations.  Changes in sleep  patterns.  Wandering and getting lost.  Severe stage In the final stage, you will need help with your personal care and dailyactivities. Symptoms in this stage include:  Worsening memory loss.  Personality changes.  Loss of awareness of your surroundings.  Changes in physical abilities, including the ability to walk, sit, and swallow.  Difficulty in communicating.  Inability to control the bladder and bowels.  Increasing confusion.  Increasing disruptive behavior.  How is this diagnosed? This condition is diagnosed with an assessment by your health care provider. During this assessment, your health care provider will talk with you and your family, friends, or caregivers about your symptoms. A thorough medical history will be taken, and you will have a physical exam and tests. Tests may include:  Lab tests, such as blood or urine tests.  Imaging tests, such as a CT scan, PET scan, or MRI.  A lumbar puncture. This test involves removing and testing a small amount of the fluid that surrounds the brain and spinal cord.  An electroencephalogram (EEG). In this test, small metal discs are used to measure electrical activity in the brain.  Memory tests, cognitive tests, and neuropsychological tests. These tests evaluate brain function.  How is this treated? At this time, there is no treatment to cure Alzheimer disease or stop it from getting worse. The goals of treatment are:  To slow down the disease.  To manage behavioral problems.  To provide you with a safe environment.  To make life easier for you and your caregivers.  The following treatment options are available:  Medicines. Medicines may help to slow down   memory loss and control behavioral symptoms.  Talk therapy. Talk therapy provides you with education, support, and memory aids. It is most helpful in the early stages of the condition.  Counseling or spiritual guidance. It is normal to have a lot of feelings,  including anger, relief, fear, and isolation. Counseling and guidance can help you deal with these feelings.  Caregiving. This involves having caregivers help you with your daily activities. Caregivers may be family members, friends, or trained medical professionals. Caregiving can be done at home or outside the home.  Family support groups. These provide education, emotional support, and information about community resources to family members who are taking care of you.  Follow these instructions at home: Medicines  Take over-the-counter and prescription medicines only as told by your health care provider.  Avoid taking medicines that can affect thinking, such as pain or sleeping medicines. Lifestyle   Make healthy lifestyle choices: ? Be physically active as told by your health care provider. ? Do not use any tobacco products, such as cigarettes, chewing tobacco, and e-cigarettes. If you need help quitting, ask your health care provider. ? Eat a healthy diet. ? Practice stress-management techniques when you get stressed. ? Stay social.  Drink enough fluid to keep your urine clear or pale yellow.  Make sure to get quality sleep. These tips can help you get a good night's rest: ? Avoid napping during the day. ? Keep your sleeping area dark and cool. ? Avoid exercising during the few hours before you go to bed. ? Avoid caffeine products in the evening. General instructions  Work with your health care provider to determine what you need help with and what your safety needs are.  If you were given a bracelet that tracks your location, make sure to wear it.  Keep all follow-up visits as told by your health care provider. This is important.  If you have questions or would like additional support, you may contact The Alzheimer's Association: ? 24-hour helpline: 1-800-272-3900 ? Website: www.alz.org Contact a health care provider if:  You have nausea, vomiting, or trouble with  eating.  You have dizziness, or weakness.  You have new or worsening trouble with sleeping.  You or your family members become concerned for your safety. Get help right away if:  You develop chest pain or difficulty with breathing.  You pass out. This information is not intended to replace advice given to you by your health care provider. Make sure you discuss any questions you have with your health care provider. Document Released: 02/05/2004 Document Revised: 01/25/2016 Document Reviewed: 02/21/2015 Elsevier Interactive Patient Education  2018 [employer]
